# Patient Record
Sex: Female | Born: 1950 | Race: White | Hispanic: No | Marital: Married | Smoking: Never smoker
Health system: Southern US, Community
[De-identification: ages and names within clinical notes are randomized; demographics above are authoritative.]

## PROBLEM LIST (undated history)

## (undated) DIAGNOSIS — M1990 Unspecified osteoarthritis, unspecified site: Secondary | ICD-10-CM

## (undated) DIAGNOSIS — Z808 Family history of malignant neoplasm of other organs or systems: Secondary | ICD-10-CM

## (undated) DIAGNOSIS — Z800 Family history of malignant neoplasm of digestive organs: Secondary | ICD-10-CM

## (undated) DIAGNOSIS — G4730 Sleep apnea, unspecified: Secondary | ICD-10-CM

## (undated) DIAGNOSIS — Z6839 Body mass index (BMI) 39.0-39.9, adult: Secondary | ICD-10-CM

## (undated) DIAGNOSIS — Z923 Personal history of irradiation: Secondary | ICD-10-CM

## (undated) DIAGNOSIS — E119 Type 2 diabetes mellitus without complications: Secondary | ICD-10-CM

## (undated) DIAGNOSIS — Z9889 Other specified postprocedural states: Secondary | ICD-10-CM

## (undated) DIAGNOSIS — I10 Essential (primary) hypertension: Secondary | ICD-10-CM

## (undated) HISTORY — DX: Sleep apnea, unspecified: G47.30

## (undated) HISTORY — DX: Essential (primary) hypertension: I10

## (undated) HISTORY — DX: Body mass index (BMI) 39.0-39.9, adult: Z68.39

## (undated) HISTORY — DX: Family history of malignant neoplasm of other organs or systems: Z80.8

## (undated) HISTORY — DX: Family history of malignant neoplasm of digestive organs: Z80.0

## (undated) HISTORY — DX: Type 2 diabetes mellitus without complications: E11.9

---

## 1997-12-19 ENCOUNTER — Encounter: Admission: RE | Admit: 1997-12-19 | Discharge: 1998-03-19 | Payer: Self-pay | Admitting: Gynecology

## 1999-02-05 ENCOUNTER — Other Ambulatory Visit: Admission: RE | Admit: 1999-02-05 | Discharge: 1999-02-05 | Payer: Self-pay | Admitting: Obstetrics and Gynecology

## 2000-02-05 ENCOUNTER — Other Ambulatory Visit: Admission: RE | Admit: 2000-02-05 | Discharge: 2000-02-05 | Payer: Self-pay | Admitting: Obstetrics and Gynecology

## 2002-05-18 HISTORY — PX: CHOLECYSTECTOMY: SHX55

## 2002-09-14 ENCOUNTER — Encounter: Payer: Self-pay | Admitting: Family Medicine

## 2002-09-14 ENCOUNTER — Encounter: Admission: RE | Admit: 2002-09-14 | Discharge: 2002-09-14 | Payer: Self-pay | Admitting: Family Medicine

## 2002-10-02 ENCOUNTER — Encounter: Payer: Self-pay | Admitting: General Surgery

## 2002-10-05 ENCOUNTER — Encounter: Payer: Self-pay | Admitting: General Surgery

## 2002-10-05 ENCOUNTER — Encounter (INDEPENDENT_AMBULATORY_CARE_PROVIDER_SITE_OTHER): Payer: Self-pay | Admitting: Specialist

## 2002-10-05 ENCOUNTER — Ambulatory Visit (HOSPITAL_COMMUNITY): Admission: RE | Admit: 2002-10-05 | Discharge: 2002-10-06 | Payer: Self-pay | Admitting: General Surgery

## 2003-06-25 ENCOUNTER — Other Ambulatory Visit: Admission: RE | Admit: 2003-06-25 | Discharge: 2003-06-25 | Payer: Self-pay | Admitting: Family Medicine

## 2004-06-19 ENCOUNTER — Ambulatory Visit: Payer: Self-pay | Admitting: Family Medicine

## 2004-06-26 ENCOUNTER — Ambulatory Visit: Payer: Self-pay | Admitting: Family Medicine

## 2004-07-08 ENCOUNTER — Ambulatory Visit: Payer: Self-pay | Admitting: Family Medicine

## 2004-07-24 ENCOUNTER — Other Ambulatory Visit: Admission: RE | Admit: 2004-07-24 | Discharge: 2004-07-24 | Payer: Self-pay | Admitting: Addiction Medicine

## 2005-09-01 ENCOUNTER — Other Ambulatory Visit: Admission: RE | Admit: 2005-09-01 | Discharge: 2005-09-01 | Payer: Self-pay | Admitting: Obstetrics and Gynecology

## 2005-10-05 ENCOUNTER — Ambulatory Visit: Payer: Self-pay | Admitting: Family Medicine

## 2005-10-13 ENCOUNTER — Ambulatory Visit: Payer: Self-pay | Admitting: Family Medicine

## 2005-10-14 ENCOUNTER — Ambulatory Visit: Payer: Self-pay | Admitting: Internal Medicine

## 2006-10-19 ENCOUNTER — Other Ambulatory Visit: Admission: RE | Admit: 2006-10-19 | Discharge: 2006-10-19 | Payer: Self-pay | Admitting: Obstetrics and Gynecology

## 2007-01-31 ENCOUNTER — Ambulatory Visit: Payer: Self-pay | Admitting: Family Medicine

## 2007-01-31 LAB — CONVERTED CEMR LAB
ALT: 24 units/L (ref 0–35)
AST: 19 units/L (ref 0–37)
Alkaline Phosphatase: 67 units/L (ref 39–117)
BUN: 10 mg/dL (ref 6–23)
Basophils Relative: 0.5 % (ref 0.0–1.0)
Bilirubin Urine: NEGATIVE
Bilirubin, Direct: 0.1 mg/dL (ref 0.0–0.3)
CO2: 27 meq/L (ref 19–32)
Calcium: 9 mg/dL (ref 8.4–10.5)
Chloride: 109 meq/L (ref 96–112)
Cholesterol: 203 mg/dL (ref 0–200)
Creatinine, Ser: 0.8 mg/dL (ref 0.4–1.2)
Eosinophils Relative: 6 % — ABNORMAL HIGH (ref 0.0–5.0)
GFR calc Af Amer: 95 mL/min
Glucose, Bld: 104 mg/dL — ABNORMAL HIGH (ref 70–99)
Glucose, Urine, Semiquant: NEGATIVE
HDL: 34.7 mg/dL — ABNORMAL LOW (ref >39.0)
Ketones, urine, test strip: NEGATIVE
Lymphocytes Relative: 43.3 % (ref 12.0–46.0)
Monocytes Relative: 7.7 % (ref 3.0–11.0)
Neutro Abs: 2.1 10*3/uL (ref 1.4–7.7)
Nitrite: NEGATIVE
Platelets: 284 10*3/uL (ref 150–400)
Protein, U semiquant: NEGATIVE
RBC: 4.18 M/uL (ref 3.87–5.11)
RDW: 13.2 % (ref 11.5–14.6)
Total CHOL/HDL Ratio: 5.9
Total Protein: 6.6 g/dL (ref 6.0–8.3)
Triglycerides: 158 mg/dL — ABNORMAL HIGH (ref 0–149)
Urobilinogen, UA: 0.2
VLDL: 32 mg/dL (ref 0–40)
WBC: 5 10*3/uL (ref 4.5–10.5)

## 2007-02-07 ENCOUNTER — Ambulatory Visit: Payer: Self-pay | Admitting: Family Medicine

## 2007-04-18 DIAGNOSIS — J069 Acute upper respiratory infection, unspecified: Secondary | ICD-10-CM | POA: Insufficient documentation

## 2007-04-21 ENCOUNTER — Ambulatory Visit: Payer: Self-pay | Admitting: Family Medicine

## 2007-04-21 ENCOUNTER — Telehealth: Payer: Self-pay | Admitting: Family Medicine

## 2007-10-26 ENCOUNTER — Other Ambulatory Visit: Admission: RE | Admit: 2007-10-26 | Discharge: 2007-10-26 | Payer: Self-pay | Admitting: Obstetrics and Gynecology

## 2007-11-02 ENCOUNTER — Encounter: Payer: Self-pay | Admitting: Family Medicine

## 2007-11-02 ENCOUNTER — Ambulatory Visit: Payer: Self-pay | Admitting: Internal Medicine

## 2007-11-03 ENCOUNTER — Encounter: Admission: RE | Admit: 2007-11-03 | Discharge: 2007-11-03 | Payer: Self-pay | Admitting: Obstetrics and Gynecology

## 2007-11-09 ENCOUNTER — Encounter: Admission: RE | Admit: 2007-11-09 | Discharge: 2007-11-09 | Payer: Self-pay | Admitting: Obstetrics and Gynecology

## 2008-01-16 ENCOUNTER — Ambulatory Visit: Payer: Self-pay | Admitting: Family Medicine

## 2008-01-16 LAB — CONVERTED CEMR LAB
AST: 21 units/L (ref 0–37)
Alkaline Phosphatase: 64 units/L (ref 39–117)
Basophils Absolute: 0.1 10*3/uL (ref 0.0–0.1)
Bilirubin Urine: NEGATIVE
Bilirubin, Direct: 0.1 mg/dL (ref 0.0–0.3)
Blood in Urine, dipstick: NEGATIVE
Chloride: 110 meq/L (ref 96–112)
Cholesterol: 163 mg/dL (ref 0–200)
Eosinophils Absolute: 0.3 10*3/uL (ref 0.0–0.7)
GFR calc non Af Amer: 78 mL/min
Glucose, Urine, Semiquant: NEGATIVE
HDL: 33.4 mg/dL — ABNORMAL LOW (ref >39.0)
LDL Cholesterol: 105 mg/dL — ABNORMAL HIGH (ref 0–99)
Lymphocytes Relative: 39 % (ref 12.0–46.0)
MCHC: 33.9 g/dL (ref 30.0–36.0)
MCV: 90.5 fL (ref 78.0–100.0)
Neutrophils Relative %: 45.1 % (ref 43.0–77.0)
Platelets: 279 10*3/uL (ref 150–400)
Potassium: 4.3 meq/L (ref 3.5–5.1)
Protein, U semiquant: NEGATIVE
Sodium: 144 meq/L (ref 135–145)
TSH: 3.44 microintl units/mL (ref 0.35–5.50)
Total Bilirubin: 0.6 mg/dL (ref 0.3–1.2)
Urobilinogen, UA: 0.2
VLDL: 24 mg/dL (ref 0–40)
WBC Urine, dipstick: NEGATIVE
pH: 5.5

## 2008-01-24 ENCOUNTER — Ambulatory Visit: Payer: Self-pay | Admitting: Family Medicine

## 2008-04-20 ENCOUNTER — Telehealth: Payer: Self-pay | Admitting: *Deleted

## 2008-04-28 ENCOUNTER — Telehealth: Payer: Self-pay | Admitting: Family Medicine

## 2008-07-07 ENCOUNTER — Telehealth: Payer: Self-pay | Admitting: Internal Medicine

## 2008-07-09 ENCOUNTER — Ambulatory Visit: Payer: Self-pay | Admitting: Family Medicine

## 2008-07-30 ENCOUNTER — Ambulatory Visit: Payer: Self-pay | Admitting: Obstetrics and Gynecology

## 2008-11-26 ENCOUNTER — Ambulatory Visit: Payer: Self-pay | Admitting: Family Medicine

## 2008-11-26 DIAGNOSIS — M722 Plantar fascial fibromatosis: Secondary | ICD-10-CM | POA: Insufficient documentation

## 2008-11-26 DIAGNOSIS — M79609 Pain in unspecified limb: Secondary | ICD-10-CM | POA: Insufficient documentation

## 2008-11-26 LAB — CONVERTED CEMR LAB
BUN: 12 mg/dL (ref 6–23)
Basophils Relative: 0.6 % (ref 0.0–3.0)
CO2: 30 meq/L (ref 19–32)
Chloride: 105 meq/L (ref 96–112)
Creatinine, Ser: 0.7 mg/dL (ref 0.4–1.2)
Eosinophils Absolute: 0.2 10*3/uL (ref 0.0–0.7)
Glucose, Bld: 93 mg/dL (ref 70–99)
MCHC: 34.2 g/dL (ref 30.0–36.0)
MCV: 89.5 fL (ref 78.0–100.0)
Monocytes Absolute: 0.5 10*3/uL (ref 0.1–1.0)
Neutro Abs: 2.6 10*3/uL (ref 1.4–7.7)
Neutrophils Relative %: 45.5 % (ref 43.0–77.0)
RBC: 4.34 M/uL (ref 3.87–5.11)

## 2008-12-06 ENCOUNTER — Telehealth: Payer: Self-pay | Admitting: Family Medicine

## 2009-01-18 ENCOUNTER — Ambulatory Visit: Payer: Self-pay | Admitting: Family Medicine

## 2009-01-18 LAB — CONVERTED CEMR LAB
ALT: 31 U/L
AST: 24 U/L
Albumin: 4 g/dL
Alkaline Phosphatase: 66 U/L
BUN: 17 mg/dL
Basophils Absolute: 0 K/uL
Basophils Relative: 0.3 %
Bilirubin Urine: NEGATIVE
Bilirubin, Direct: 0 mg/dL
Blood in Urine, dipstick: NEGATIVE
CO2: 30 meq/L
Calcium: 9 mg/dL
Chloride: 106 meq/L
Cholesterol: 185 mg/dL
Creatinine, Ser: 0.8 mg/dL
Eosinophils Absolute: 0.3 K/uL
Eosinophils Relative: 4.1 %
GFR calc non Af Amer: 77.94 mL/min
Glucose, Bld: 95 mg/dL
Glucose, Urine, Semiquant: NEGATIVE
HCT: 40.7 %
HDL: 38.4 mg/dL — ABNORMAL LOW
Hemoglobin: 14 g/dL
Ketones, urine, test strip: NEGATIVE
LDL Cholesterol: 122 mg/dL — ABNORMAL HIGH
Lymphocytes Relative: 31.5 %
Lymphs Abs: 2.4 K/uL
MCHC: 34.4 g/dL
MCV: 89.1 fL
Monocytes Absolute: 0.6 K/uL
Monocytes Relative: 7.6 %
Neutro Abs: 4.3 K/uL
Neutrophils Relative %: 56.5 %
Nitrite: NEGATIVE
Platelets: 257 K/uL
Potassium: 4 meq/L
RBC: 4.56 M/uL
RDW: 13.6 %
Sodium: 142 meq/L
Specific Gravity, Urine: 1.015
TSH: 2.54 u[IU]/mL
Total Bilirubin: 0.7 mg/dL
Total CHOL/HDL Ratio: 5
Total Protein: 7.4 g/dL
Triglycerides: 122 mg/dL
Urobilinogen, UA: 0.2
VLDL: 24.4 mg/dL
WBC Urine, dipstick: NEGATIVE
WBC: 7.6 10*3/microliter
pH: 7.5

## 2009-01-22 ENCOUNTER — Encounter: Admission: RE | Admit: 2009-01-22 | Discharge: 2009-01-22 | Payer: Self-pay | Admitting: Family Medicine

## 2009-01-25 ENCOUNTER — Ambulatory Visit: Payer: Self-pay | Admitting: Family Medicine

## 2009-10-30 ENCOUNTER — Telehealth: Payer: Self-pay | Admitting: Speech Pathology

## 2010-02-27 ENCOUNTER — Encounter: Admission: RE | Admit: 2010-02-27 | Discharge: 2010-02-27 | Payer: Self-pay | Admitting: Family Medicine

## 2010-06-09 ENCOUNTER — Other Ambulatory Visit: Payer: Self-pay | Admitting: Family Medicine

## 2010-06-09 ENCOUNTER — Ambulatory Visit
Admission: RE | Admit: 2010-06-09 | Discharge: 2010-06-09 | Payer: Self-pay | Source: Home / Self Care | Attending: Family Medicine | Admitting: Family Medicine

## 2010-06-09 LAB — CBC WITH DIFFERENTIAL/PLATELET
Eosinophils Absolute: 0.2 10*3/uL (ref 0.0–0.7)
Eosinophils Relative: 4.3 % (ref 0.0–5.0)
HCT: 39.3 % (ref 36.0–46.0)
Lymphs Abs: 2.5 10*3/uL (ref 0.7–4.0)
MCHC: 33.8 g/dL (ref 30.0–36.0)
MCV: 90.3 fl (ref 78.0–100.0)
Monocytes Absolute: 0.4 10*3/uL (ref 0.1–1.0)
Platelets: 297 10*3/uL (ref 150.0–400.0)
WBC: 5.7 10*3/uL (ref 4.5–10.5)

## 2010-06-09 LAB — CONVERTED CEMR LAB
Blood in Urine, dipstick: NEGATIVE
Nitrite: NEGATIVE
Specific Gravity, Urine: 1.015
Urobilinogen, UA: 0.2
WBC Urine, dipstick: NEGATIVE

## 2010-06-09 LAB — HEPATIC FUNCTION PANEL
ALT: 28 U/L (ref 0–35)
Total Bilirubin: 0.7 mg/dL (ref 0.3–1.2)
Total Protein: 7.1 g/dL (ref 6.0–8.3)

## 2010-06-09 LAB — LIPID PANEL
Cholesterol: 188 mg/dL (ref 0–200)
LDL Cholesterol: 118 mg/dL — ABNORMAL HIGH (ref 0–99)
Triglycerides: 158 mg/dL — ABNORMAL HIGH (ref 0.0–149.0)

## 2010-06-09 LAB — BASIC METABOLIC PANEL
BUN: 13 mg/dL (ref 6–23)
CO2: 30 mEq/L (ref 19–32)
Chloride: 98 mEq/L (ref 96–112)
Glucose, Bld: 86 mg/dL (ref 70–99)
Potassium: 4.5 mEq/L (ref 3.5–5.1)

## 2010-06-09 LAB — TSH: TSH: 3.11 u[IU]/mL (ref 0.35–5.50)

## 2010-06-16 ENCOUNTER — Ambulatory Visit
Admission: RE | Admit: 2010-06-16 | Discharge: 2010-06-16 | Payer: Self-pay | Source: Home / Self Care | Attending: Family Medicine | Admitting: Family Medicine

## 2010-06-16 ENCOUNTER — Encounter: Payer: Self-pay | Admitting: Family Medicine

## 2010-06-17 NOTE — Progress Notes (Signed)
Summary: Pt req referral to Physical Therapist  Phone Note Call from Patient Call back at Home Phone [phone] Call back at Work Phone [phone]   Caller: Patient Summary of Call: Pt called is is req referral for Physical Therapist re: Plantarfascitis. Dr. Tawanna Cooler said that he would refer pt to PT.    Initial call taken by: Lucy Antigua,  October 30, 2009 3:04 PM  Follow-up for Phone Call        please set up patient to see Jeanene Erb, physical therapist for treatment of plantar fasciitis Follow-up by: Roderick Pee MD,  October 31, 2009 8:35 AM

## 2010-06-25 NOTE — Assessment & Plan Note (Signed)
Summary: cpx/mm   Vital Signs:  Patient profile:   60 year old female Menstrual status:  postmenopausal Height:      63.25 inches Weight:      205 pounds BMI:     36.16 Temp:     98.5 degrees F oral BP sitting:   130 / 84  (left arm) Cuff size:   regular  Vitals Entered By: Kern Reap CMA Duncan Dull) (June 16, 2010 2:05 PM) CC: cpx Is Patient Diabetic? No Pain Assessment Patient in pain? no        CC:  cpx.  History of Present Illness: Kylie Davis is a 60 year old, married female, nonsmoker, who comes in today for general physical examination  She's always been in excellent health except for some venous insufficiency for which he takes Mevacor, thiazide 25 mg daily, and her weight is 63 inches.  Weight 205 pounds.  Review of systems otherwise negative.  She gets routine eye care, dental care, BSE monthly, and a mammography, colonoscopy, normal.  Tetanus 2005.  Encouraged annual flu shots in the future  Allergies: No Known Drug Allergies  Past History:  Past medical, surgical, family and social histories (including risk factors) reviewed, and no changes noted (except as noted below). Past medical, surgical, family and social histories (including risk factors) reviewed for relevance to current acute and chronic problems.  Past Medical History: Reviewed history from 04/21/2007 and no changes required. gallbladder removal  Past Surgical History: Reviewed history from 01/18/2007 and no changes required. Cholecystectomy  2004  Family History: Reviewed history from 04/21/2007 and no changes required. Family History Ovarian cancerMom 2008 Family History of CAD Female 1st degree relative <60 Family History High cholesterol Family History Hypertension  Social History: Reviewed history from 04/21/2007 and no changes required. Occupation:schoolteacher Married Never Smoked Alcohol use-no Drug use-no Regular exercise-no  Review of Systems      See HPI  Physical  Exam  General:  Well-developed,well-nourished,in no acute distress; alert,appropriate and cooperative throughout examination Head:  Normocephalic and atraumatic without obvious abnormalities. No apparent alopecia or balding. Eyes:  No corneal or conjunctival inflammation noted. EOMI. Perrla. Funduscopic exam benign, without hemorrhages, exudates or papilledema. Vision grossly normal. Ears:  External ear exam shows no significant lesions or deformities.  Otoscopic examination reveals clear canals, tympanic membranes are intact bilaterally without bulging, retraction, inflammation or discharge. Hearing is grossly normal bilaterally. Nose:  External nasal examination shows no deformity or inflammation. Nasal mucosa are pink and moist without lesions or exudates. Mouth:  Oral mucosa and oropharynx without lesions or exudates.  Teeth in good repair. Neck:  No deformities, masses, or tenderness noted. Chest Wall:  No deformities, masses, or tenderness noted. Breasts:  No mass, nodules, thickening, tenderness, bulging, retraction, inflamation, nipple discharge or skin changes noted.   Lungs:  Normal respiratory effort, chest expands symmetrically. Lungs are clear to auscultation, no crackles or wheezes. Heart:  Normal rate and regular rhythm. S1 and S2 normal without gallop, murmur, click, rub or other extra sounds. Abdomen:  Bowel sounds positive,abdomen soft and non-tender without masses, organomegaly or hernias noted. Rectal:  gun Genitalia:  gyn Msk:  No deformity or scoliosis noted of thoracic or lumbar spine.   Pulses:  R and L carotid,radial,femoral,dorsalis pedis and posterior tibial pulses are full and equal bilaterally Extremities:  No clubbing, cyanosis, edema, or deformity noted with normal full range of motion of all joints.   Neurologic:  No cranial nerve deficits noted. Station and gait are normal. Plantar reflexes are down-going  bilaterally. DTRs are symmetrical throughout. Sensory, motor  and coordinative functions appear intact. Skin:  Intact without suspicious lesions or rashes Cervical Nodes:  No lymphadenopathy noted Axillary Nodes:  No palpable lymphadenopathy Inguinal Nodes:  No significant adenopathy Psych:  Cognition and judgment appear intact. Alert and cooperative with normal attention span and concentration. No apparent delusions, illusions, hallucinations   Impression & Recommendations:  Problem # 1:  PERIPHERAL EDEMA (ICD-782.3) Assessment Improved  Her updated medication list for this problem includes:    Hydrochlorothiazide 25 Mg Tabs (Hydrochlorothiazide) .Marland Kitchen... Take 1 tablet by mouth every morning  Orders: Prescription Created Electronically [phone])  Problem # 2:  PHYSICAL EXAMINATION (ICD-V70.0) Assessment: Unchanged  Orders: Prescription Created Electronically [phone]) EKG w/ Interpretation (93000)  Problem # 3:  OVERWEIGHT (ICD-278.02) Assessment: Unchanged  Orders: Prescription Created Electronically [phone]) EKG w/ Interpretation (93000)  Complete Medication List: 1)  Aspirin 81 Mg Tbec (Aspirin) .... Once daily 2)  Multivitamins Caps (Multiple vitamin) .... Once daily 3)  Calcium 500/d 500-400 Mg-unit Chew (Calcium-vitamin d) .... Three daily 4)  Hydrochlorothiazide 25 Mg Tabs (Hydrochlorothiazide) .... Take 1 tablet by mouth every morning  Patient Instructions: 1)  It is important that you exercise regularly at least 20 minutes 5 times a week. If you develop chest pain, have severe difficulty breathing, or feel very tired , stop exercising immediately and seek medical attention. 2)  You need to lose weight. Consider a lower calorie diet and regular exercise.  3)  Schedule your mammogram. 4)  Schedule a colonoscopy/sigmoidoscopy to help detect colon cancer. 5)  Take calcium +Vitamin D daily. 6)  Take an Aspirin every day. 7)  Please schedule a follow-up appointment in 1 year. Prescriptions: HYDROCHLOROTHIAZIDE 25 MG TABS  (HYDROCHLOROTHIAZIDE) Take 1 tablet by mouth every morning  #100 x 3   Entered and Authorized by:   Roderick Pee MD   Signed by:   Roderick Pee MD on 06/16/2010   Method used:   Electronically to        [facility]       [address]       [place], [place]  [address]       Ph: [phone]       Fax: [phone]   RxID:   [id number]    Orders Added: 1)  Prescription Created Electronically [[id number]] 2)  Est. Patient 40-64 years [47425] 3)  EKG w/ Interpretation [93000]

## 2010-10-03 NOTE — Op Note (Signed)
[name], [name]                         ACCOUNT NO.:  [id number]   MEDICAL RECORD NO.:  [id number]                   PATIENT TYPE:  OIB   LOCATION:  5707                                 FACILITY:  [facility]   PHYSICIAN:  Ollen Gross. Vernell Morgans, M.D.              DATE OF BIRTH:  1950/10/04   DATE OF PROCEDURE:  10/05/2002  DATE OF DISCHARGE:                                 OPERATIVE REPORT   PREOPERATIVE DIAGNOSIS:  Gallstones.   POSTOPERATIVE DIAGNOSIS:  Gallstones.   PROCEDURE:  Laparoscopic cholecystectomy with intraoperative cholangiogram.   SURGEON:  Ollen Gross. Carolynne Edouard, M.D.   ASSISTANT:  Anselm Pancoast. Zachery Dakins, M.D.   ANESTHESIA:  General endotracheal anesthesia.   DESCRIPTION OF PROCEDURE:  After informed consent was obtained, the patient  was brought to the operating room and placed in the supine position on the  operating table.  After adequate induction of general anesthesia the  patient's abdomen was prepped with Betadine and draped in the usual sterile  manner.  The area below the umbilicus was infiltrated with 0.25% Marcaine.   A small incision was made with the 15-blade knife.  This incision was  carried down through the subcutaneous tissue bluntly with the Kelly clamp  and __________ retractors.  The linea alba was identified.  The linea alba  was incised with the 15-blade knife and each side was grasped with Kocher  clamps and elevated anteriorly.  The preperitoneal space was prepared  bluntly with the hemostat until the peritoneum was opened and access was  obtained into the abdominal cavity.  A #0 Vicryl pursestring stitch was  placed in the fascia surrounding the opening.  A Hasson cannula was placed  through the opening and anchored in place with previously placed Vicryl  pursestring stitch.  The abdomen was then insufflated with carbon dioxide  without difficulty.   The patient was placed in the head up position and the laparoscope was  placed through the Hasson  cannula and the right upper quadrant was  inspected. The dome of the gallbladder and liver were readily identified.  Next, the epigastric area was infiltrated with 0.25% Marcaine and a small  incision was made with the 15-blade knife.  A 10-mm port was then placed  bluntly through this incision into the abdominal cavity under direct vision.  Sites were then chosen laterally on the right side of the abdomen for  placement of 5-mm ports.  Each of these areas was infiltrated with 0.25%  Marcaine.   Small stab incisions were made with a 15-blade knife and 5-mm ports were  placed bluntly through these incisions into the abdominal cavity under  direct vision.  A blunt grasp was placed to the lattermost 5-mm port and  used to grasp the bed of the gallbladder and elevate it anteriorly and  superiorly.  Another blunt grasp was placed at the other 5-mm port and used  to retract  on the body and neck of the gallbladder.  A dissector was placed  through the epigastric port, and using electrocautery, the peritoneal  reflection of the gallbladder neck area was opened.   Blunt dissection was then carried out in this area until the gallbladder and  its cystic duct junction was readily identified and a good window was  created.  A single clip was placed on the gallbladder neck.  A small  ductotomy was made with the laparoscopic scissors.  A 14-gauge Angiocath was  placed percutaneously through the anterior abdominal wall.  A Reddick  cholangiogram catheter was placed through the Angiocath and flushed.  The  Reddick catheter was then placed within the cystic duct and anchored in  place with a clip.   A cholangiogram was obtained that showed a good length on the cystic duct,  rapid emptying into the duodenum and no filling defects.  The anchoring  clipping catheters were then removed from the patient. Three clips were  placed proximally on the cystic duct and the duct was divided between the  two sets of  clips.  Posterior to this the cystic artery was identified, and  again dissected circumferentially until a good window was created.  Two  clips were placed proximally and 1 distally on the artery, and the artery  was divided between the two.   Next, a laparoscopic hook cautery device was used to separate the  gallbladder from the liver bed prior to completely detaching the gallbladder  from the liver bed and the liver bed was inspected and several small  bleeding points were coagulated with the electrocautery.  The gallbladder  was then detached the rest of the way from the liver bed without difficulty.  The laparoscope was then moved to the upper midline port and a gallbladder  grasper was placed through the Hasson cannula and the gallbladder neck was  grasped with the grasper and the gallbladder was removed with the Hasson  cannula through the infraumbilical port without difficulty.   The fascial defect was then closed with the previously placed Vicryl  pursestring stitch.  The rest of the reports were then removed under direct  vision and all were found to be hemostatic.  The gas was allowed to escape.  The skin incisions were all closed with interrupted 4-0 Monocryl  subcuticular stitches.  Benzoin and Steri-Strips were applied.  The patient  tolerated the procedure well.  At the end of the case all needle, sponge,  and instrument counts were correct.  The patient was awakened and taken to  the recovery room in stable condition.                                               Ollen Gross. Vernell Morgans, M.D.    PST/MEDQ  D:  10/05/2002  T:  10/06/2002  Job:  [id number]

## 2010-12-03 ENCOUNTER — Other Ambulatory Visit (HOSPITAL_COMMUNITY)
Admission: RE | Admit: 2010-12-03 | Discharge: 2010-12-03 | Disposition: A | Discharge status: Home / Self Care | Payer: BC Managed Care – PPO | Source: Ambulatory Visit | Attending: Obstetrics and Gynecology | Admitting: Obstetrics and Gynecology

## 2010-12-03 ENCOUNTER — Other Ambulatory Visit: Payer: Self-pay | Admitting: Obstetrics and Gynecology

## 2010-12-03 ENCOUNTER — Encounter (INDEPENDENT_AMBULATORY_CARE_PROVIDER_SITE_OTHER): Payer: BC Managed Care – PPO | Admitting: Obstetrics and Gynecology

## 2010-12-03 DIAGNOSIS — Z01419 Encounter for gynecological examination (general) (routine) without abnormal findings: Secondary | ICD-10-CM

## 2010-12-03 DIAGNOSIS — R823 Hemoglobinuria: Secondary | ICD-10-CM

## 2010-12-03 DIAGNOSIS — Z124 Encounter for screening for malignant neoplasm of cervix: Secondary | ICD-10-CM | POA: Insufficient documentation

## 2010-12-15 ENCOUNTER — Encounter: Payer: Self-pay | Admitting: Family Medicine

## 2010-12-15 ENCOUNTER — Ambulatory Visit (INDEPENDENT_AMBULATORY_CARE_PROVIDER_SITE_OTHER): Payer: BC Managed Care – PPO | Admitting: Family Medicine

## 2010-12-15 VITALS — BP 120/82 | Temp 98.3°F | Wt 208.0 lb

## 2010-12-15 DIAGNOSIS — L738 Other specified follicular disorders: Secondary | ICD-10-CM

## 2010-12-15 DIAGNOSIS — Z23 Encounter for immunization: Secondary | ICD-10-CM

## 2010-12-15 DIAGNOSIS — L739 Follicular disorder, unspecified: Secondary | ICD-10-CM

## 2010-12-15 NOTE — Progress Notes (Signed)
  Subjective:    Patient ID: Kylie Davis, female    DOB: 1950-07-29, 60 y.o.   MRN: [id number]  HPI Patient seen with basically asymptomatic rash anterior chest. Noted just recently after her trip back from the beach. Rash is nonpainful and nonpruritic. No pustular center. No other generalized rash. She is requesting shingles vaccine. She has not taken any prednisone or immunosuppressants. No history of chemotherapy. No allergic contraindications. Denies any recent fever or chills   Review of Systems  Constitutional: Negative for fever and chills.  Respiratory: Negative for shortness of breath.   Skin: Positive for rash.  Hematological: Negative for adenopathy.       Objective:   Physical Exam  Constitutional: She appears well-developed and well-nourished.  Cardiovascular: Normal rate and regular rhythm.   Pulmonary/Chest: Effort normal and breath sounds normal. No respiratory distress. She has no wheezes. She has no rales.  Skin:       Patient has a few nonspecific erythematous papules anterior chest. No pustular center. No vesicles. These are nonscaly          Assessment & Plan:  #1 nonspecific follicular rash anterior chest. Antibacterial soap and consider antibiotic if not improving #2 shingles vaccine. No contraindications. Distally given today No problem-specific assessment & plan notes found for this encounter.

## 2010-12-15 NOTE — Patient Instructions (Signed)
Keep rash clean with good antibacterial soap such as Lever 2000 or Dial. Touch base in 2-3 weeks if this is not clearing and sooner if rash worsens

## 2010-12-25 ENCOUNTER — Other Ambulatory Visit: Payer: Self-pay

## 2010-12-25 ENCOUNTER — Ambulatory Visit (INDEPENDENT_AMBULATORY_CARE_PROVIDER_SITE_OTHER): Payer: BC Managed Care – PPO | Admitting: Gynecology

## 2010-12-25 DIAGNOSIS — Z13820 Encounter for screening for osteoporosis: Secondary | ICD-10-CM

## 2011-01-20 ENCOUNTER — Other Ambulatory Visit: Payer: Self-pay | Admitting: Obstetrics and Gynecology

## 2011-01-20 DIAGNOSIS — Z1231 Encounter for screening mammogram for malignant neoplasm of breast: Secondary | ICD-10-CM

## 2011-02-03 ENCOUNTER — Encounter: Payer: Self-pay | Admitting: Obstetrics and Gynecology

## 2011-03-02 ENCOUNTER — Ambulatory Visit
Admission: RE | Admit: 2011-03-02 | Discharge: 2011-03-02 | Disposition: A | Discharge status: Home / Self Care | Payer: BC Managed Care – PPO | Source: Ambulatory Visit | Attending: Obstetrics and Gynecology | Admitting: Obstetrics and Gynecology

## 2011-03-02 DIAGNOSIS — Z1231 Encounter for screening mammogram for malignant neoplasm of breast: Secondary | ICD-10-CM

## 2011-06-15 ENCOUNTER — Other Ambulatory Visit (INDEPENDENT_AMBULATORY_CARE_PROVIDER_SITE_OTHER): Payer: BC Managed Care – PPO

## 2011-06-15 DIAGNOSIS — Z0000 Encounter for general adult medical examination without abnormal findings: Secondary | ICD-10-CM

## 2011-06-15 LAB — CBC WITH DIFFERENTIAL/PLATELET
Basophils Absolute: 0 10*3/uL (ref 0.0–0.1)
Basophils Relative: 0.6 % (ref 0.0–3.0)
Eosinophils Absolute: 0.2 10*3/uL (ref 0.0–0.7)
HCT: 38.1 % (ref 36.0–46.0)
Hemoglobin: 12.7 g/dL (ref 12.0–15.0)
Lymphs Abs: 2.4 10*3/uL (ref 0.7–4.0)
MCHC: 33.3 g/dL (ref 30.0–36.0)
MCV: 90.5 fl (ref 78.0–100.0)
Monocytes Absolute: 0.6 10*3/uL (ref 0.1–1.0)
Neutro Abs: 3 10*3/uL (ref 1.4–7.7)
RDW: 14.5 % (ref 11.5–14.6)

## 2011-06-15 LAB — POCT URINALYSIS DIPSTICK
Bilirubin, UA: NEGATIVE
Glucose, UA: NEGATIVE
Ketones, UA: NEGATIVE
Leukocytes, UA: NEGATIVE
Nitrite, UA: NEGATIVE
pH, UA: 6

## 2011-06-15 LAB — BASIC METABOLIC PANEL
CO2: 28 mEq/L (ref 19–32)
Calcium: 8.6 mg/dL (ref 8.4–10.5)
Chloride: 105 mEq/L (ref 96–112)
Glucose, Bld: 98 mg/dL (ref 70–99)
Sodium: 139 mEq/L (ref 135–145)

## 2011-06-15 LAB — HEPATIC FUNCTION PANEL
ALT: 26 U/L (ref 0–35)
Albumin: 3.7 g/dL (ref 3.5–5.2)
Bilirubin, Direct: 0 mg/dL (ref 0.0–0.3)
Total Protein: 7.2 g/dL (ref 6.0–8.3)

## 2011-06-15 LAB — LIPID PANEL
HDL: 43.2 mg/dL (ref >39.00)
Total CHOL/HDL Ratio: 4
Triglycerides: 146 mg/dL (ref 0.0–149.0)

## 2011-06-22 ENCOUNTER — Ambulatory Visit (INDEPENDENT_AMBULATORY_CARE_PROVIDER_SITE_OTHER): Payer: BC Managed Care – PPO | Admitting: Family Medicine

## 2011-06-22 ENCOUNTER — Encounter: Payer: Self-pay | Admitting: Family Medicine

## 2011-06-22 DIAGNOSIS — E663 Overweight: Secondary | ICD-10-CM

## 2011-06-22 DIAGNOSIS — R609 Edema, unspecified: Secondary | ICD-10-CM

## 2011-06-22 MED ORDER — HYDROCHLOROTHIAZIDE 25 MG PO TABS: 25.0000 mg | ORAL_TABLET | ORAL | Status: DC

## 2011-06-22 NOTE — Patient Instructions (Signed)
Continue your current medications  This-year-old work on the diet exercise and weight loss  Followup in 1 year sooner if any problems

## 2011-06-22 NOTE — Progress Notes (Signed)
  Subjective:    Patient ID: Kylie Davis, female    DOB: [birth year], 61 y.o.   MRN: [id number]  HPI,, Kylie Davis is a 61 year old married female nonsmoker who comes in today for general physical examination because of a history of peripheral edema treated with hydrochlorothiazide 25 mg daily and overweight  She also takes an aspirin tablet calcium vitamin D.  Her weight is unchanged at 216 pounds  She gets routine eye care, dental care, BSE monthly, and you mammography, initial colonoscopy normal, vaccinations up to date.    Review of Systems  Constitutional: Negative.   HENT: Negative.   Eyes: Negative.   Respiratory: Negative.   Cardiovascular: Negative.   Gastrointestinal: Negative.   Genitourinary: Negative.   Musculoskeletal: Negative.   Neurological: Negative.   Hematological: Negative.   Psychiatric/Behavioral: Negative.        Objective:   Physical Exam  Constitutional: She appears well-developed and well-nourished.  HENT:  Head: Normocephalic and atraumatic.  Right Ear: External ear normal.  Left Ear: External ear normal.  Nose: Nose normal.  Mouth/Throat: Oropharynx is clear and moist.  Eyes: EOM are normal. Pupils are equal, round, and reactive to light.  Neck: Normal range of motion. Neck supple. No thyromegaly present.  Cardiovascular: Normal rate, regular rhythm, normal heart sounds and intact distal pulses.  Exam reveals no gallop and no friction rub.   No murmur heard. Pulmonary/Chest: Effort normal and breath sounds normal.  Abdominal: Soft. Bowel sounds are normal. She exhibits no distension and no mass. There is no tenderness. There is no rebound.  Genitourinary:       Bilateral breast exam normal  Musculoskeletal: Normal range of motion.  Lymphadenopathy:    She has no cervical adenopathy.  Neurological: She is alert. She has normal reflexes. No cranial nerve deficit. She exhibits normal muscle tone. Coordination normal.  Skin: Skin is warm and dry.    Psychiatric: She has a normal mood and affect. Her behavior is normal. Judgment and thought content normal.          Assessment & Plan:  Healthy female  Overweight again recommended diet exercise and weight loss  Peripheral edema continue hydrochlorothiazide followup in 1 year

## 2011-08-05 ENCOUNTER — Telehealth: Payer: Self-pay | Admitting: *Deleted

## 2011-08-05 ENCOUNTER — Encounter: Payer: Self-pay | Admitting: Family

## 2011-08-05 ENCOUNTER — Ambulatory Visit (INDEPENDENT_AMBULATORY_CARE_PROVIDER_SITE_OTHER): Payer: BC Managed Care – PPO | Admitting: Family

## 2011-08-05 VITALS — BP 120/80 | HR 83 | Temp 97.7°F

## 2011-08-05 DIAGNOSIS — H918X9 Other specified hearing loss, unspecified ear: Secondary | ICD-10-CM

## 2011-08-05 DIAGNOSIS — H6120 Impacted cerumen, unspecified ear: Secondary | ICD-10-CM

## 2011-08-05 DIAGNOSIS — H6122 Impacted cerumen, left ear: Secondary | ICD-10-CM

## 2011-08-05 NOTE — Patient Instructions (Signed)
Cerumen Impaction  A cerumen impaction is when the wax in your ear forms a plug. This plug usually causes reduced hearing. Sometimes it also causes an earache or dizziness. Removing a cerumen impaction can be difficult and painful. The wax sticks to the ear canal. The canal is sensitive and bleeds easily. If you try to remove a heavy wax buildup with a cotton tipped swab, you may push it in further.  Irrigation with water, suction, and small ear curettes may be used to clear out the wax. If the impaction is fixed to the skin in the ear canal, ear drops may be needed for a few days to loosen the wax. People who build up a lot of wax frequently can use ear wax removal products available in your local drugstore.  SEEK MEDICAL CARE IF:    You develop an earache, increased hearing loss, or marked dizziness.  Document Released: 06/11/2004 Document Revised: 04/23/2011 Document Reviewed: 08/01/2009  [employer] Patient Information 2012 [employer].

## 2011-08-05 NOTE — Telephone Encounter (Signed)
Pt  Cannot hear from one ear this am, and knew that Dr Tawanna Cooler meant to do an irrigation her last visit but forgot.  Will see Padonda today.

## 2011-08-05 NOTE — Progress Notes (Signed)
  Subjective:    Patient ID: Kylie Davis, female    DOB: 10/29/1950, 61 y.o.   MRN: [id number]  HPI 61 year old white female who is in today with complaints of a cerumen impaction left ear. She's had decreased hearing in her left ear. Denies any pain, no drainage or discharge.   Review of Systems  Constitutional: Negative.   HENT: Positive for hearing loss. Negative for congestion, rhinorrhea, sneezing, neck pain and postnasal drip.   Respiratory: Negative.   Cardiovascular: Negative.    No past medical history on file.  History   Social History  . Marital Status: Married    Spouse Name: N/A    Number of Children: N/A  . Years of Education: N/A   Occupational History  . Not on file.   Social History Main Topics  . Smoking status: Never Smoker   . Smokeless tobacco: Not on file  . Alcohol Use: Not on file  . Drug Use: Not on file  . Sexually Active: Not on file   Other Topics Concern  . Not on file   Social History Narrative  . No narrative on file    Past Surgical History  Procedure Date  . Cholecystectomy 2004    Family History  Problem Relation Age of Onset  . Cancer Mother     ovarian  . Coronary artery disease Other   . Hyperlipidemia Other   . Hypertension Other   . Cancer Father     stomach  . Heart disease Father   . Hypertension Brother   . Diabetes Paternal Grandmother     No Known Allergies  Current Outpatient Prescriptions on File Prior to Visit  Medication Sig Dispense Refill  . aspirin 81 MG tablet Take 81 mg by mouth daily.        . calcium-vitamin D (SM CALCIUM 500/VITAMIN D3) 500-400 MG-UNIT per tablet Take 1 tablet by mouth 3 (three) times daily.        . hydrochlorothiazide (HYDRODIURIL) 25 MG tablet Take 1 tablet (25 mg total) by mouth every morning.  100 tablet  3  . multivitamin (THERAGRAN) per tablet Take 1 tablet by mouth daily.          BP 120/80  Pulse 83  Temp(Src) 97.7 F (36.5 C) (Oral)  SpO2 97%chart      Objective:   Physical Exam  Constitutional: She is oriented to person, place, and time. She appears well-developed and well-nourished.  HENT:  Right Ear: External ear normal.  Nose: Nose normal.  Mouth/Throat: Oropharynx is clear and moist.       Left ear cerumen impaction  Neck: Normal range of motion. Neck supple.  Cardiovascular: Normal rate, regular rhythm and normal heart sounds.   Pulmonary/Chest: Effort normal and breath sounds normal.  Neurological: She is alert and oriented to person, place, and time.  Skin: Skin is warm and dry.  Psychiatric: She has a normal mood and affect.          Assessment & Plan:  Assessment: Cerumen impaction, hearing loss-temporary  Plan: Recheck when necessary

## 2012-02-16 ENCOUNTER — Telehealth: Payer: Self-pay | Admitting: Family Medicine

## 2012-02-16 NOTE — Telephone Encounter (Signed)
Caller: [name]/Patient; Patient Name: Kylie Davis; PCP: Kelle Darting [facility]); Best Callback Phone Number: [phone]; Reason for call: Slipped getting out of the shower and slid becoming aware of left knee pain on 02/08/12. Patient has used Ibuprofen and ice to knee.  Rate pain in knee as a 3/10 pain scale. Can walk and bend knee but has pain on inner side of  left knee.  Denies swelling , but may be slightly.  Becomes uncomfortable riding in the care and at times when sitting.  Has tingling to the center of the left side of the knee.  Triaged using Knee injury with a disposition of home care due to twisting injury with minimal symptoms now.  Care advice given with caller demonstrating understanding.  No appointment needed at this time.

## 2012-02-18 ENCOUNTER — Encounter: Payer: Self-pay | Admitting: Internal Medicine

## 2012-02-25 ENCOUNTER — Ambulatory Visit (INDEPENDENT_AMBULATORY_CARE_PROVIDER_SITE_OTHER): Payer: BC Managed Care – PPO | Admitting: Family Medicine

## 2012-02-25 ENCOUNTER — Encounter: Payer: Self-pay | Admitting: Family Medicine

## 2012-02-25 VITALS — BP 130/76 | HR 72 | Temp 98.6°F | Resp 16 | Ht 63.5 in | Wt 222.0 lb

## 2012-02-25 DIAGNOSIS — E663 Overweight: Secondary | ICD-10-CM

## 2012-02-25 DIAGNOSIS — IMO0002 Reserved for concepts with insufficient information to code with codable children: Secondary | ICD-10-CM

## 2012-02-25 DIAGNOSIS — Z23 Encounter for immunization: Secondary | ICD-10-CM

## 2012-02-25 DIAGNOSIS — S83207A Unspecified tear of unspecified meniscus, current injury, left knee, initial encounter: Secondary | ICD-10-CM

## 2012-02-25 MED ORDER — TRAMADOL HCL 50 MG PO TABS: ORAL_TABLET | ORAL | Status: DC

## 2012-02-25 NOTE — Progress Notes (Signed)
  Subjective:    Patient ID: Kylie Davis, female    DOB: 1950/10/23, 61 y.o.   MRN: [id number]  HPI Carollee Herter is a 61 year old married female nonsmoker who comes in today with a two-week history of pain in her left knee. She states about 2 weeks ago at home she twisted her left knee and it seemed to be improving with Motrin elevation and ice and so yesterday when she was walking in her knee disc gave way. She's never had a history of trauma to that knee. She is overweight   Review of Systems General and orthopedic review of systems otherwise negative    Objective:   Physical Exam Well-developed well-nourished female in no acute distress examination of the knee shows the ligaments to be intact there is tenderness along the medial joint line full range of motion       Assessment & Plan:  Partial tear of medial cartilage left knee plan elevation ice knee immobilizer crutches

## 2012-02-25 NOTE — Patient Instructions (Addendum)
Motrin 600 mg twice daily with food  Elevation and ice as much is possible  Crutches and a knee immobilizer  Return in 4 weeks for followup  Tramadol one half to one tablet at bedtime when necessary for pain

## 2012-02-29 ENCOUNTER — Ambulatory Visit: Payer: BC Managed Care – PPO | Admitting: Family Medicine

## 2012-02-29 ENCOUNTER — Telehealth: Payer: Self-pay | Admitting: Family Medicine

## 2012-02-29 NOTE — Telephone Encounter (Signed)
Pt was here to see Dr. Tawanna Cooler on 10/10, and he gave her crutches and an immobilizer for her knee. Pt having difficult time walking long distances on those, and is requesting a handicap parking sticker for a couple of weeks. Please advise and call pt. Thank you.

## 2012-02-29 NOTE — Telephone Encounter (Signed)
Ready for pick up and patient is aware 

## 2012-03-24 ENCOUNTER — Encounter: Payer: Self-pay | Admitting: Family Medicine

## 2012-03-24 ENCOUNTER — Ambulatory Visit (INDEPENDENT_AMBULATORY_CARE_PROVIDER_SITE_OTHER): Payer: BC Managed Care – PPO | Admitting: Family Medicine

## 2012-03-24 VITALS — BP 124/80

## 2012-03-24 DIAGNOSIS — IMO0002 Reserved for concepts with insufficient information to code with codable children: Secondary | ICD-10-CM

## 2012-03-24 DIAGNOSIS — S83207A Unspecified tear of unspecified meniscus, current injury, left knee, initial encounter: Secondary | ICD-10-CM

## 2012-03-24 NOTE — Patient Instructions (Signed)
Stop the knee immobilizer  Elevation and ice each bedtime when necessary  Continue the Motrin 200 mg twice a day  Followup when necessary

## 2012-03-24 NOTE — Progress Notes (Signed)
  Subjective:    Patient ID: TYFFANI FOGLESONG, female    DOB: [birth year], 61 y.o.   MRN: [id number]  HPI Ellana is a 61 year old female who comes in today for reevaluation of pain in her left knee  We saw her on October 10. 2 weeks prior that she had twisted her knee and fell pain and was treating herself symptomatically at home until the day prior to that October 10 visit when she twisted and felt a popping sensation at. On exam she appeared to have a small tear in the medial cartilage left knee. She was given an immobilizer elevation and ice and Motrin and she is back today for followup. She states she is taking Motrin 200 mg twice a day wearing the knee immobilizer using ice and a knee feels a whole lot better.  She and her husband Skip are taking a boat to [place] in January and she wants to be sure she is okay to travel   Review of Systems    orthopedic review of systems otherwise negative Objective:   Physical Exam Well-developed and nourished female in acute distress examination of left knee shows no effusion ligaments intact ,,,,,,,,,,,,,no tenderness medial joint line       Assessment & Plan:  Suspected small tear of medial cartilage left knee improve with elevation and immobilization ice and Motrin stop the knee immobilizer continue the Motrin elevation and ice in the evening return when necessary

## 2012-05-04 ENCOUNTER — Encounter: Payer: Self-pay | Admitting: Obstetrics and Gynecology

## 2012-05-04 ENCOUNTER — Ambulatory Visit (INDEPENDENT_AMBULATORY_CARE_PROVIDER_SITE_OTHER): Payer: BC Managed Care – PPO | Admitting: Obstetrics and Gynecology

## 2012-05-04 ENCOUNTER — Other Ambulatory Visit (HOSPITAL_COMMUNITY)
Admission: RE | Admit: 2012-05-04 | Discharge: 2012-05-04 | Disposition: A | Discharge status: Home / Self Care | Payer: BC Managed Care – PPO | Source: Ambulatory Visit | Attending: Obstetrics and Gynecology | Admitting: Obstetrics and Gynecology

## 2012-05-04 VITALS — BP 124/74 | Ht 64.0 in | Wt 220.0 lb

## 2012-05-04 DIAGNOSIS — Z01419 Encounter for gynecological examination (general) (routine) without abnormal findings: Secondary | ICD-10-CM

## 2012-05-04 NOTE — Patient Instructions (Signed)
Continue yearly mammograms 

## 2012-05-04 NOTE — Progress Notes (Signed)
Patient came to see me today for her annual GYN exam. She will occasionally get hot flashes and sleep disturbance but generally is doing well. She is having no vaginal bleeding. She is having no pelvic pain. 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 She's had normal Pap smears since then. Her last Pap smear was 2012. She has scheduled her yearly mammogram. Her last bone density was last year and was normal. Her mother had peritoneal cancer-see pathology report in chart. She actually is 5 years out and still doing well. She unfortunately does have dementia.  Physical examination:Kylie Davis present. HEENT within normal limits. Neck: Thyroid not large. No masses. Supraclavicular nodes: not enlarged. Breasts: Examined in both sitting and lying  position. No skin changes and no masses. Abdomen: Soft no guarding rebound or masses or hernia. Pelvic: External: Within normal limits. BUS: Within normal limits. Vaginal:within normal limits. Good estrogen effect. No evidence of cystocele rectocele or enterocele. Cervix: clean. Uterus: Normal size and shape. Adnexa: No masses. Rectovaginal exam: Confirmatory and negative. Extremities: Within normal limits.  Assessment: Normal GYN exam  Plan: Mammogram. Discussed BRCA1 and BRCA2 testing. Patient does not appear to be higher risk but will consider doing  next year when the price changes. The new Pap smear guidelines were discussed with the patient. Pap done at patient request. Lab work elsewhere.

## 2012-05-06 ENCOUNTER — Other Ambulatory Visit: Payer: Self-pay | Admitting: Gynecology

## 2012-05-06 DIAGNOSIS — Z1231 Encounter for screening mammogram for malignant neoplasm of breast: Secondary | ICD-10-CM

## 2012-07-12 ENCOUNTER — Ambulatory Visit
Admission: RE | Admit: 2012-07-12 | Discharge: 2012-07-12 | Disposition: A | Discharge status: Home / Self Care | Payer: BC Managed Care – PPO | Source: Ambulatory Visit | Attending: Gynecology | Admitting: Gynecology

## 2012-07-12 DIAGNOSIS — Z1231 Encounter for screening mammogram for malignant neoplasm of breast: Secondary | ICD-10-CM

## 2012-09-05 ENCOUNTER — Telehealth: Payer: Self-pay | Admitting: Family Medicine

## 2012-09-05 NOTE — Telephone Encounter (Signed)
PT was seen in the ED this past weekend for elevated BP. She's still feeling bad and would like to see Dr. Tawanna Cooler ASAP. Please assist.

## 2012-09-05 NOTE — Telephone Encounter (Signed)
appt scheduled for tomorrow at 52

## 2012-09-06 ENCOUNTER — Ambulatory Visit (INDEPENDENT_AMBULATORY_CARE_PROVIDER_SITE_OTHER): Payer: BC Managed Care – PPO | Admitting: Family Medicine

## 2012-09-06 ENCOUNTER — Encounter: Payer: Self-pay | Admitting: Family Medicine

## 2012-09-06 VITALS — BP 144/90 | HR 80 | Temp 98.6°F | Resp 18 | Ht 64.0 in | Wt 212.0 lb

## 2012-09-06 DIAGNOSIS — R609 Edema, unspecified: Secondary | ICD-10-CM

## 2012-09-06 DIAGNOSIS — J309 Allergic rhinitis, unspecified: Secondary | ICD-10-CM

## 2012-09-06 DIAGNOSIS — E663 Overweight: Secondary | ICD-10-CM

## 2012-09-06 DIAGNOSIS — I10 Essential (primary) hypertension: Secondary | ICD-10-CM | POA: Insufficient documentation

## 2012-09-06 MED ORDER — HYDROCHLOROTHIAZIDE 25 MG PO TABS: 25.0000 mg | ORAL_TABLET | ORAL | Status: DC

## 2012-09-06 NOTE — Patient Instructions (Signed)
Take the HCTZ 25 mg one tab daily  Purchase a digital blood pressure cuff,,,,,,, [employer],,,,,,,,,, the product is omron,,,,,,,,, digital pump up blood pressure cuff  Check your blood pressure daily in the morning  Return in 2 weeks for followup with the blood pressure cuff and the data  Salt free diet  Walk 30 minutes daily  Zyrtec plain 10 mg at bedtime  One shot of steroid nasal spray up each nostril at bedtime

## 2012-09-06 NOTE — Progress Notes (Signed)
  Subjective:    Patient ID: Kylie Davis, female    DOB: Nov 26, 1950, 62 y.o.   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 For she went to [facility], then she went to an urgent care, then she went to an emergency room for evaluation of elevated blood pressure. She states the emergency room her blood pressure is 190/100. She had numerous diagnostic studies all of which were normal. She was taking HCTZ 25 mg daily to help with peripheral edema however she hasn't taken that in over a month  She's also had a week's worth of allergic symptoms head congestion postnasal drip and cough.  Review of Systems    review of systems otherwise negative Objective:   Physical Exam Well-developed well-nourished female no acute distress HEENT negative neck was supple no adenopathy lungs are clear  BP right arm sitting position 140/80       Assessment & Plan:  Allergic rhinitis over-the-counter Zyrtec and steroid nasal spray  Hypertension restart hydrochlorothiazide 1 daily BP check daily followup in 2 weeks

## 2012-09-22 ENCOUNTER — Ambulatory Visit: Payer: BC Managed Care – PPO | Admitting: Family Medicine

## 2012-10-12 ENCOUNTER — Ambulatory Visit (INDEPENDENT_AMBULATORY_CARE_PROVIDER_SITE_OTHER): Payer: BC Managed Care – PPO | Admitting: Family Medicine

## 2012-10-12 ENCOUNTER — Encounter: Payer: Self-pay | Admitting: Family Medicine

## 2012-10-12 VITALS — BP 150/90 | Temp 97.8°F | Wt 231.0 lb

## 2012-10-12 DIAGNOSIS — I10 Essential (primary) hypertension: Secondary | ICD-10-CM

## 2012-10-12 DIAGNOSIS — M25569 Pain in unspecified knee: Secondary | ICD-10-CM

## 2012-10-12 DIAGNOSIS — M25562 Pain in left knee: Secondary | ICD-10-CM

## 2012-10-12 MED ORDER — LISINOPRIL-HYDROCHLOROTHIAZIDE 10-12.5 MG PO TABS: 1.0000 | ORAL_TABLET | Freq: Every day | ORAL | Status: DC

## 2012-10-12 NOTE — Patient Instructions (Signed)
Stop the hydrochlorothiazide  Begin Zestoretic,,,,,,,,, one tablet daily in the morning  Purchase a digital pump up blood pressure cuff,,,,,,,,,,, or if you can purchase a new cuff alone that might work just as well  Check your blood pressure daily in the morning  Return in 2 weeks for followup  Elevation and ice and Motrin 400 mg twice daily for your knee pain  If we don't see any improvement or gets worse I would recommend Dr. Albertha Ghee with Dr. [facility] Register group   If the clicking in the jaw gets worse I would recommend he see an oral surgeon Dr. Manson Passey

## 2012-10-12 NOTE — Progress Notes (Signed)
  Subjective:    Patient ID: Kylie Davis, female    DOB: 02-21-1951, 62 y.o.   MRN: [id number]  HPI Kylie Davis is a 62 year old female married nonsmoker who comes in today for evaluation of 3 problems  She's been on hydrochlorothiazide 25 mg daily however BP is 160/90. She brings in a cough however the cuff is inaccurate. Her digital cuff reads 149/80 however her blood pressure is 160/90.  She's having trouble with clicking in her right jaw no pain  She's also pain in the medial portion of her left knee. We saw her for this previously and felt like she might have a small meniscus tear. She used over-the-counter medications elevation ice and the pain resolved now it's back. No locking no edema   Review of Systems    review of systems otherwise negative Objective:   Physical Exam  Well-developed well nourished female no acute distress BP right arm sitting position 160/90      Assessment & Plan:  Hypertension not at goal plan DC hydrochlorothiazide ,,,,,,,,,,, start Zestoretic  Pain at medial portion left knee,,,,,,,, C. Instructions  Click in left jaw oral surgery consult when necessary

## 2012-10-26 ENCOUNTER — Encounter: Payer: Self-pay | Admitting: Family Medicine

## 2012-10-26 ENCOUNTER — Ambulatory Visit (INDEPENDENT_AMBULATORY_CARE_PROVIDER_SITE_OTHER): Payer: BC Managed Care – PPO | Admitting: Family Medicine

## 2012-10-26 VITALS — BP 130/78 | Temp 98.5°F | Wt 215.0 lb

## 2012-10-26 DIAGNOSIS — I10 Essential (primary) hypertension: Secondary | ICD-10-CM

## 2012-10-26 LAB — BASIC METABOLIC PANEL
CO2: 27 mEq/L (ref 19–32)
Calcium: 9.4 mg/dL (ref 8.4–10.5)
Chloride: 99 mEq/L (ref 96–112)
Sodium: 137 mEq/L (ref 135–145)

## 2012-10-26 NOTE — Patient Instructions (Addendum)
Continue the Zestoretic one daily  Walk 30 minutes daily  Avoid salt  We will call you about your lab work  Since your blood pressures normal I would just check it once weekly in the morning  If you get an abnormal reading then I would check it daily for 2 weeks,,,,,,,,,, if after 2 weeks of data gathering your blood pressures are all elevated than return to see [place] with the data and the device  If however after checking your blood pressure for 2 weeks in a row a drop back to normal.......... Then go back to just checking it once weekly

## 2012-10-26 NOTE — Progress Notes (Signed)
  Subjective:    Patient ID: Kylie Davis, female    DOB: [birth year], 62 y.o.   MRN: [id number]  HPI [name] is a 62 year old married female nonsmoker who comes in today for evaluation of hypertension  She was on hydrochlorothiazide 25 mg daily however blood pressure 1 up. We changed her to Zestoretic 10-12.5 and blood pressure is now normal. BP right arm sitting position 130/80   Review of Systems Review of systems negative no cough or hives    Objective:   Physical Exam Well-developed and nourished female no acute distress BP right arm sitting position 130/80       Assessment & Plan:  Hypertension at goal on Zestoretic 10-12.5,,,,,,,, continue medicine daily,,,, check be met

## 2012-12-22 ENCOUNTER — Encounter: Payer: Self-pay | Admitting: Internal Medicine

## 2013-01-06 ENCOUNTER — Telehealth: Payer: Self-pay | Admitting: Family Medicine

## 2013-01-06 NOTE — Telephone Encounter (Signed)
Pt stated lisinopril is causing her a cough. [employer] [place]. Please advise

## 2013-01-11 MED ORDER — LOSARTAN POTASSIUM-HCTZ 50-12.5 MG PO TABS: ORAL_TABLET | ORAL | Status: DC

## 2013-01-11 NOTE — Telephone Encounter (Signed)
Per Dr Tawanna Cooler patient to stop lisinopril, start hyzaar, keep a record of bp.  Patient is aware.  New rx sent to pharmacy

## 2013-02-08 ENCOUNTER — Ambulatory Visit (AMBULATORY_SURGERY_CENTER): Payer: Self-pay | Admitting: *Deleted

## 2013-02-08 VITALS — Ht 64.0 in | Wt 219.0 lb

## 2013-02-08 DIAGNOSIS — Z1211 Encounter for screening for malignant neoplasm of colon: Secondary | ICD-10-CM

## 2013-02-08 MED ORDER — MOVIPREP 100 G PO SOLR: ORAL | Status: DC

## 2013-02-08 NOTE — Progress Notes (Signed)
Patient denies any allergies to eggs or soy. Patient denies any problems with anesthesia.  

## 2013-02-09 ENCOUNTER — Encounter: Payer: Self-pay | Admitting: Internal Medicine

## 2013-02-20 ENCOUNTER — Other Ambulatory Visit: Payer: Self-pay | Admitting: Family Medicine

## 2013-02-24 ENCOUNTER — Encounter: Payer: Self-pay | Admitting: Internal Medicine

## 2013-02-24 ENCOUNTER — Ambulatory Visit (AMBULATORY_SURGERY_CENTER): Payer: BC Managed Care – PPO | Admitting: Internal Medicine

## 2013-02-24 VITALS — BP 133/70 | HR 84 | Temp 97.6°F | Resp 25 | Ht 64.0 in | Wt 219.0 lb

## 2013-02-24 DIAGNOSIS — Z1211 Encounter for screening for malignant neoplasm of colon: Secondary | ICD-10-CM

## 2013-02-24 HISTORY — PX: COLONOSCOPY: SHX174

## 2013-02-24 MED ORDER — SODIUM CHLORIDE 0.9 % IV SOLN: 500.0000 mL | INTRAVENOUS | Status: DC

## 2013-02-24 NOTE — Op Note (Signed)
[facility] [address] [place], [address]   COLONOSCOPY PROCEDURE REPORT  PATIENT: Kylie Davis, Kylie Davis  MR#: [id number] BIRTHDATE: 1950/07/23 , 62  yrs. old GENDER: Female ENDOSCOPIST: Hart Carwin, MD REFERRED [id number] Shawnie Dapper, M.D. PROCEDURE DATE:  02/24/2013 PROCEDURE:   Colonoscopy, screening First Screening Colonoscopy - Avg.  risk and is 50 yrs.  old or older - No.  Prior Negative Screening - Now for repeat screening. 10 or more years since last screening  History of Adenoma - Now for follow-up colonoscopy & has been > or = to 3 yrs.  N/A  Polyps Removed Today? No.  Recommend repeat exam, <10 yrs? No. ASA CLASS:   Class II INDICATIONS:Average risk patient for colon cancer. MEDICATIONS: MAC sedation, administered by CRNA and propofol (Diprivan) 200mg  IV  DESCRIPTION OF PROCEDURE:   After the risks benefits and alternatives of the procedure were thoroughly explained, informed consent was obtained.  A digital rectal exam revealed no abnormalities of the rectum.   The LB [id number]-[id number]  endoscope was introduced through the anus and advanced to the cecum, which was identified by both the appendix and ileocecal valve. No adverse events experienced.   The quality of the prep was excellent, using MoviPrep  The instrument was then slowly withdrawn as the colon was fully examined.      COLON FINDINGS: A normal appearing cecum, ileocecal valve, and appendiceal orifice were identified.  The ascending, hepatic flexure, transverse, splenic flexure, descending, sigmoid colon and rectum appeared unremarkable.  No polyps or cancers were seen. Retroflexed views revealed no abnormalities. The time to cecum=3 minutes 55 seconds.  Withdrawal time=6 minutes 15 seconds.  The scope was withdrawn and the procedure completed. COMPLICATIONS: There were no complications.  ENDOSCOPIC IMPRESSION: Normal colon  RECOMMENDATIONS: 1.  High fiber diet 2.   recall colon 10  years   eSigned:  Hart Carwin, MD 02/24/2013 10:48 AM   cc:   PATIENT NAME:  [name], [name] MR#: [id number]

## 2013-02-24 NOTE — Patient Instructions (Signed)
Discharge instructions given with verbal understanding. Normal exam. Resume previous medications. YOU HAD AN ENDOSCOPIC PROCEDURE TODAY AT THE [facility]: Refer to the procedure report that was given to you for any specific questions about what was found during the examination.  If the procedure report does not answer your questions, please call your gastroenterologist to clarify.  If you requested that your care partner not be given the details of your procedure findings, then the procedure report has been included in a sealed envelope for you to review at your convenience later.  YOU SHOULD EXPECT: Some feelings of bloating in the abdomen. Passage of more gas than usual.  Walking can help get rid of the air that was put into your GI tract during the procedure and reduce the bloating. If you had a lower endoscopy (such as a colonoscopy or flexible sigmoidoscopy) you may notice spotting of blood in your stool or on the toilet paper. If you underwent a bowel prep for your procedure, then you may not have a normal bowel movement for a few days.  DIET: Your first meal following the procedure should be a light meal and then it is ok to progress to your normal diet.  A half-sandwich or bowl of soup is an example of a good first meal.  Heavy or fried foods are harder to digest and may make you feel nauseous or bloated.  Likewise meals heavy in dairy and vegetables can cause extra gas to form and this can also increase the bloating.  Drink plenty of fluids but you should avoid alcoholic beverages for 24 hours.  ACTIVITY: Your care partner should take you home directly after the procedure.  You should plan to take it easy, moving slowly for the rest of the day.  You can resume normal activity the day after the procedure however you should NOT DRIVE or use heavy machinery for 24 hours (because of the sedation medicines used during the test).    SYMPTOMS TO REPORT IMMEDIATELY: A gastroenterologist  can be reached at any hour.  During normal business hours, 8:30 AM to 5:00 PM Monday through Friday, call [phone].  After hours and on weekends, please call the GI answering service at [phone] who will take a message and have the physician on call contact you.   Following lower endoscopy (colonoscopy or flexible sigmoidoscopy):  Excessive amounts of blood in the stool  Significant tenderness or worsening of abdominal pains  Swelling of the abdomen that is new, acute  Fever of 100F or higher  FOLLOW UP: If any biopsies were taken you will be contacted by phone or by letter within the next 1-3 weeks.  Call your gastroenterologist if you have not heard about the biopsies in 3 weeks.  Our staff will call the home number listed on your records the next business day following your procedure to check on you and address any questions or concerns that you may have at that time regarding the information given to you following your procedure. This is a courtesy call and so if there is no answer at the home number and we have not heard from you through the emergency physician on call, we will assume that you have returned to your regular daily activities without incident.  SIGNATURES/CONFIDENTIALITY: You and/or your care partner have signed paperwork which will be entered into your electronic medical record.  These signatures attest to the fact that that the information above on your After Visit Summary has been reviewed   and is understood.  Full responsibility of the confidentiality of this discharge information lies with you and/or your care-partner. 

## 2013-02-24 NOTE — Progress Notes (Signed)
Patient did not experience any of the following events: a burn prior to discharge; a fall within the facility; wrong site/side/patient/procedure/implant event; or a hospital transfer or hospital admission upon discharge from the facility. (G8907) Patient did not have preoperative order for IV antibiotic SSI prophylaxis. (G8918)  

## 2013-02-24 NOTE — Progress Notes (Signed)
Procedure ends, to recovery, report given and VSS. 

## 2013-02-27 ENCOUNTER — Telehealth: Payer: Self-pay | Admitting: *Deleted

## 2013-02-27 NOTE — Telephone Encounter (Signed)
  Follow up Call-  Call back number 02/24/2013  Post procedure Call Back phone  # [phone]  Permission to leave phone message Yes     Patient questions:  Do you have a fever, pain , or abdominal swelling? no Pain Score  0 *  Have you tolerated food without any problems? yes  Have you been able to return to your normal activities? yes  Do you have any questions about your discharge instructions: Diet   no Medications  no Follow up visit  no  Do you have questions or concerns about your Care? no  Actions: * If pain score is 4 or above: No action needed, pain <4.  Stated you guys were wonderful.

## 2013-04-06 ENCOUNTER — Ambulatory Visit (INDEPENDENT_AMBULATORY_CARE_PROVIDER_SITE_OTHER): Payer: BC Managed Care – PPO | Admitting: Family

## 2013-04-06 ENCOUNTER — Encounter: Payer: Self-pay | Admitting: Family

## 2013-04-06 VITALS — BP 130/70 | HR 97 | Temp 98.5°F | Wt 221.0 lb

## 2013-04-06 DIAGNOSIS — J069 Acute upper respiratory infection, unspecified: Secondary | ICD-10-CM

## 2013-04-06 DIAGNOSIS — R05 Cough: Secondary | ICD-10-CM

## 2013-04-06 DIAGNOSIS — R059 Cough, unspecified: Secondary | ICD-10-CM

## 2013-04-06 DIAGNOSIS — J209 Acute bronchitis, unspecified: Secondary | ICD-10-CM

## 2013-04-06 MED ORDER — PREDNISONE 20 MG PO TABS: ORAL_TABLET | ORAL | Status: AC

## 2013-04-06 NOTE — Progress Notes (Signed)
Subjective:    Patient ID: Kylie Davis, female    DOB: 05-06-1951, 62 y.o.   MRN: [id number]  HPI 62 year old white female, nonsmoker, patient of Dr. Tawanna Cooler is in today with complaints of sore throat, ear pain, fever, cough x2 days. She is then to give Zyrtec and ibuprofen with no relief.   Review of Systems  Constitutional: Positive for fever. Negative for fatigue.  HENT: Positive for ear pain and sore throat.   Respiratory: Positive for cough.   Cardiovascular: Negative.   Endocrine: Negative.   Musculoskeletal: Negative.   Skin: Negative.   Neurological: Negative.   Hematological: Negative.   Psychiatric/Behavioral: Negative.    Past Medical History  Diagnosis Date  . Hypertension     History   Social History  . Marital Status: Married    Spouse Name: N/A    Number of Children: N/A  . Years of Education: N/A   Occupational History  . Not on file.   Social History Main Topics  . Smoking status: Never Smoker   . Smokeless tobacco: Never Used  . Alcohol Use: 2.0 oz/week    4 drink(s) per week  . Drug Use: No  . Sexual Activity: Yes    Birth Control/ Protection: Post-menopausal   Other Topics Concern  . Not on file   Social History Narrative  . No narrative on file    Past Surgical History  Procedure Laterality Date  . Cholecystectomy  2004    Family History  Problem Relation Age of Onset  . Cancer Mother 8    Peritoneal cancer,colon,ovaries  . Alzheimer's disease Mother   . Stomach cancer Mother   . Cancer Father     stomach  . Heart disease Father   . Hypertension Brother   . Diabetes Paternal Grandmother   . Colon cancer Neg Hx     No Known Allergies  Current Outpatient Prescriptions on File Prior to Visit  Medication Sig Dispense Refill  . aspirin 81 MG tablet Take 81 mg by mouth daily.        . calcium-vitamin D (SM CALCIUM 500/VITAMIN D3) 500-400 MG-UNIT per tablet Take 1 tablet by mouth 3 (three) times daily.        Marland Kitchen  losartan-hydrochlorothiazide (HYZAAR) 50-12.5 MG per tablet Take 1/2 tablet by mouth every morning.  45 tablet  5  . multivitamin (THERAGRAN) per tablet Take 1 tablet by mouth daily.         No current facility-administered medications on file prior to visit.    BP 130/70  Pulse 97  Temp(Src) 98.5 F (36.9 C) (Oral)  Wt 221 lb (100.245 kg)chart    Objective:   Physical Exam  Constitutional: She is oriented to person, place, and time. She appears well-developed and well-nourished.  HENT:  Right Ear: External ear normal.  Left Ear: External ear normal.  Nose: Nose normal.  Mouth/Throat: Oropharynx is clear and moist.  Neck: Normal range of motion. Neck supple.  Cardiovascular: Normal rate, regular rhythm and normal heart sounds.   Pulmonary/Chest: Effort normal. She has wheezes.  Neurological: She is alert and oriented to person, place, and time.  Skin: Skin is warm and dry.  Psychiatric: She has a normal mood and affect.          Assessment & Plan:  Assessment: 1. Upper respiratory infection 2. Acute bronchitis  Plan: Prednisone 60x3, 40x3, 20x3. Drink plenty of fluids. Patient probably office if symptoms worsen or persist. Recheck as scheduled, and as needed

## 2013-04-06 NOTE — Patient Instructions (Signed)
Upper Respiratory Infection, Adult  An upper respiratory infection (URI) is also known as the common cold. It is often caused by a type of germ (virus). Colds are easily spread (contagious). You can pass it to others by kissing, coughing, sneezing, or drinking out of the same glass. Usually, you get better in 1 or 2 weeks.   HOME CARE    Only take medicine as told by your doctor.   Use a warm mist humidifier or breathe in steam from a hot shower.   Drink enough water and fluids to keep your pee (urine) clear or pale yellow.   Get plenty of rest.   Return to work when your temperature is back to normal or as told by your doctor. You may use a face mask and wash your hands to stop your cold from spreading.  GET HELP RIGHT AWAY IF:    After the first few days, you feel you are getting worse.   You have questions about your medicine.   You have chills, shortness of breath, or brown or red spit (mucus).   You have yellow or brown snot (nasal discharge) or pain in the face, especially when you bend forward.   You have a fever, puffy (swollen) neck, pain when you swallow, or white spots in the back of your throat.   You have a bad headache, ear pain, sinus pain, or chest pain.   You have a high-pitched whistling sound when you breathe in and out (wheezing).   You have a lasting cough or cough up blood.   You have sore muscles or a stiff neck.  MAKE SURE YOU:    Understand these instructions.   Will watch your condition.   Will get help right away if you are not doing well or get worse.  Document Released: 10/21/2007 Document Revised: 07/27/2011 Document Reviewed: 09/08/2010  [employer] Patient Information 2014 [employer]    Bronchitis  Bronchitis is the body's way of reacting to injury and/or infection (inflammation) of the bronchi. Bronchi are the air tubes that extend from the windpipe into the lungs. If the inflammation becomes severe, it may cause shortness of breath.  CAUSES   Inflammation may be  caused by:   A virus.   Germs (bacteria).   Dust.   Allergens.   Pollutants and many other irritants.  The cells lining the bronchial tree are covered with tiny hairs (cilia). These constantly beat upward, away from the lungs, toward the mouth. This keeps the lungs free of pollutants. When these cells become too irritated and are unable to do their job, mucus begins to develop. This causes the characteristic cough of bronchitis. The cough clears the lungs when the cilia are unable to do their job. Without either of these protective mechanisms, the mucus would settle in the lungs. Then you would develop pneumonia.  Smoking is a common cause of bronchitis and can contribute to pneumonia. Stopping this habit is the single most important thing you can do to help yourself.  TREATMENT    Your caregiver may prescribe an antibiotic if the cough is caused by bacteria. Also, medicines that open up your airways make it easier to breathe. Your caregiver may also recommend or prescribe an expectorant. It will loosen the mucus to be coughed up. Only take over-the-counter or prescription medicines for pain, discomfort, or fever as directed by your caregiver.   Removing whatever causes the problem (smoking, for example) is critical to preventing the problem from getting worse.     Cough suppressants may be prescribed for relief of cough symptoms.   Inhaled medicines may be prescribed to help with symptoms now and to help prevent problems from returning.   For those with recurrent (chronic) bronchitis, there may be a need for steroid medicines.  SEEK IMMEDIATE MEDICAL CARE IF:    During treatment, you develop more pus-like mucus (purulent sputum).   You have a fever.   You become progressively more ill.   You have increased difficulty breathing, wheezing, or shortness of breath.  It is necessary to seek immediate medical care if you are elderly or sick from any other disease.  MAKE SURE YOU:    Understand these  instructions.   Will watch your condition.   Will get help right away if you are not doing well or get worse.  Document Released: 05/04/2005 Document Revised: 01/04/2013 Document Reviewed: 12/27/2012  [employer] Patient Information 2014 [employer]

## 2013-05-15 ENCOUNTER — Encounter: Payer: Self-pay | Admitting: Gynecology

## 2013-05-15 ENCOUNTER — Ambulatory Visit (INDEPENDENT_AMBULATORY_CARE_PROVIDER_SITE_OTHER): Payer: BC Managed Care – PPO | Admitting: Gynecology

## 2013-05-15 VITALS — BP 138/78 | Ht 64.0 in | Wt 218.0 lb

## 2013-05-15 DIAGNOSIS — Z01419 Encounter for gynecological examination (general) (routine) without abnormal findings: Secondary | ICD-10-CM

## 2013-05-15 DIAGNOSIS — N952 Postmenopausal atrophic vaginitis: Secondary | ICD-10-CM

## 2013-05-15 NOTE — Progress Notes (Signed)
Kylie Davis 10/03/1950 [id number]        62 y.o.  G0P0 for annual exam.  Former patient of Dr. Eda Davis. Several issues noted below.  Past medical history,surgical history, problem list, medications, allergies, family history and social history were all reviewed and documented in the EPIC chart.  ROS:  Performed and pertinent positives and negatives are included in the history, assessment and plan .  Exam: Kylie Davis assistant Filed Vitals:   05/15/13 1010  BP: 138/78  Height: 5\' 4"  (1.626 m)  Weight: 218 lb (98.884 kg)   General appearance  Normal Skin grossly normal Head/Neck normal with no cervical or supraclavicular adenopathy thyroid normal Lungs  clear Cardiac RR, without RMG Abdominal  soft, nontender, without masses, organomegaly or hernia Breasts  examined lying and sitting without masses, retractions, discharge or axillary adenopathy. Pelvic  Ext/BUS/vagina  Normal with atrophic changes  Cervix  Normal with atrophic changes  Uterus  anteverted, normal size, shape and contour, midline and mobile nontender   Adnexa  Without masses or tenderness    Anus and perineum  Normal   Rectovaginal  Normal sphincter tone without palpated masses or tenderness.    Assessment/Plan:  62 y.o. [name] for annual exam.   1. Postmenopausal/atrophic genital changes. Patient without symptoms of hot flushes, night sweats, vaginal dryness or dyspareunia. No vaginal bleeding. Will continue to monitor. Call if any vaginal bleeding. 2. Mother developed primary peritoneal carcinoma at age 65. No other family history of breast cancer or ovarian cancer. Had discussed BRCA testing with Dr. Eda Davis previously. She has elected not to do this. I again reviewed this with her and offered the option to discuss it with a Dentist. Patient does understand even if gene negative she may be a carrier for an undetected change.  Regardless patient declines screening. 3. Mammography 06/2012. Continue with  annual mammography. SBE monthly reviewed. 4. Pap smear 04/2012. No Pap smear done today. 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 Verl Davis note. Normal Pap smears since then. Plan repeat Pap smear in 3 year interval. 5. Colonoscopy 02/2013. Repeat at their recommended interval. 6. Health maintenance. No blood work done as this is done through her primary physician's office. Followup one year, sooner as needed.   Note: This document was prepared with digital dictation and possible smart phrase technology. Any transcriptional errors that result from this process are unintentional.   Kylie Lords MD, 10:33 AM 05/15/2013

## 2013-05-15 NOTE — Patient Instructions (Signed)
Followup in one year, sooner as needed.  Report any vaginal bleeding. 

## 2013-05-16 LAB — URINALYSIS W MICROSCOPIC + REFLEX CULTURE
Bilirubin Urine: NEGATIVE
Leukocytes, UA: NEGATIVE
Specific Gravity, Urine: 1.026 (ref 1.005–1.030)
Squamous Epithelial / LPF: NONE SEEN
Urobilinogen, UA: 0.2 mg/dL (ref 0.0–1.0)

## 2013-06-01 ENCOUNTER — Telehealth: Payer: Self-pay | Admitting: Family Medicine

## 2013-06-01 NOTE — Telephone Encounter (Signed)
Patient Information:  Caller Name: Takeira  Phone: [phone]  Patient: Kylie Davis, Kylie Davis  Gender: Female  DOB: May 29, 1950  Age: 63 Years  PCP: [facility])  Office Follow Up:  Does the office need to follow up with this patient?: No  Instructions For The Office: N/A  RN Note:   Place ice on the bruise to help it heal faster and to reduce swelling. Wrap the ice in a clean towel -- do not place ice directly on the skin. Apply the ice for up to 15 minutes each hour.   Keep the bruised area raised above the heart, if possible. This helps keep blood from pooling in the bruised tissue.   Try to rest the bruised body part by not overworking your muscles in that area.   If needed, take acetaminophen (Tylenol) to help reduce pain  Symptoms  Reason For Call & Symptoms: Pt states she fell on the left hip on ice 05/31/13.  And has bruise, asking about how to care for bruise.  Reviewed Health History In EMR: Yes  Reviewed Medications In EMR: Yes  Reviewed Allergies In EMR: Yes  Reviewed Surgeries / Procedures: Yes  Date of Onset of Symptoms: 05/31/2013  Guideline(s) Used:  No Protocol Available - Information Only  Disposition Per Guideline:   Home Care  Reason For Disposition Reached:   Information only question and nurse able to answer  Advice Given:  Call Back If:  New symptoms develop  You become worse.  Patient Will Follow Care Advice:  YES

## 2013-06-13 ENCOUNTER — Ambulatory Visit (INDEPENDENT_AMBULATORY_CARE_PROVIDER_SITE_OTHER): Payer: BC Managed Care – PPO | Admitting: Family Medicine

## 2013-06-13 ENCOUNTER — Encounter: Payer: Self-pay | Admitting: Family Medicine

## 2013-06-13 VITALS — BP 130/80 | Temp 98.8°F | Wt 219.0 lb

## 2013-06-13 DIAGNOSIS — M25569 Pain in unspecified knee: Secondary | ICD-10-CM

## 2013-06-13 DIAGNOSIS — J069 Acute upper respiratory infection, unspecified: Secondary | ICD-10-CM

## 2013-06-13 DIAGNOSIS — M25562 Pain in left knee: Secondary | ICD-10-CM

## 2013-06-13 MED ORDER — LOSARTAN POTASSIUM-HCTZ 50-12.5 MG PO TABS: ORAL_TABLET | ORAL | Status: DC

## 2013-06-13 MED ORDER — HYDROCODONE-HOMATROPINE 5-1.5 MG/5ML PO SYRP: ORAL_SOLUTION | ORAL | Status: DC

## 2013-06-13 NOTE — Progress Notes (Signed)
   Subjective:    Patient ID: Kylie Davis, female    DOB: 08-12-1950, 63 y.o.   MRN: [id number]  HPI Charline is a 63 year old married female nonsmoker who comes in today for evaluation of multiple issues because she and her husband are going to be out of town for 2 months and the [place] on their boat  She fell 2 weeks ago sustained a contusion of her left buttocks.  Shows a viral syndrome for one day  And she would like some her moles checked  She also has hypertension on Hyzaar and would like her pharmacy switched to [facility]   Review of Systems    review of systems negative physical exam scheduled for April Objective:   Physical Exam Well-developed well-nourished female no acute distress vital signs stable she's afebrile BP 130/80. Examination of buttocks is a healing hematoma left buttocks  Examination neck shows skin tags       Assessment & Plan:  Contusion left proximal resolving spontaneously  Skin tags reassured  Viral syndrome treat symptomatically  Hypertension renew medication

## 2013-06-13 NOTE — Patient Instructions (Signed)
For your cold drink lots of water,,,,, vaporizer,,,, Hydromet 1/2-1 teaspoon at bedtime when necessary for nighttime cough and cold  Restart your aspirin  Continue your blood pressure medication one tablet daily  Followup in April is for your physical

## 2013-06-13 NOTE — Progress Notes (Signed)
Pre visit review using our clinic review tool, if applicable. No additional management support is needed unless otherwise documented below in the visit note. 

## 2013-09-04 ENCOUNTER — Other Ambulatory Visit (INDEPENDENT_AMBULATORY_CARE_PROVIDER_SITE_OTHER): Payer: BC Managed Care – PPO

## 2013-09-04 DIAGNOSIS — Z0000 Encounter for general adult medical examination without abnormal findings: Secondary | ICD-10-CM

## 2013-09-04 LAB — POCT URINALYSIS DIPSTICK
Bilirubin, UA: NEGATIVE
GLUCOSE UA: NEGATIVE
Ketones, UA: NEGATIVE
Leukocytes, UA: NEGATIVE
Nitrite, UA: NEGATIVE
PH UA: 6.5
PROTEIN UA: NEGATIVE
SPEC GRAV UA: 1.015
UROBILINOGEN UA: 0.2

## 2013-09-04 LAB — CBC WITH DIFFERENTIAL/PLATELET
BASOS PCT: 0.6 % (ref 0.0–3.0)
Basophils Absolute: 0 10*3/uL (ref 0.0–0.1)
EOS PCT: 5 % (ref 0.0–5.0)
Eosinophils Absolute: 0.3 10*3/uL (ref 0.0–0.7)
HEMATOCRIT: 39.5 % (ref 36.0–46.0)
HEMOGLOBIN: 13.3 g/dL (ref 12.0–15.0)
LYMPHS PCT: 35.3 % (ref 12.0–46.0)
Lymphs Abs: 2.3 10*3/uL (ref 0.7–4.0)
MCHC: 33.6 g/dL (ref 30.0–36.0)
MCV: 89.1 fl (ref 78.0–100.0)
MONOS PCT: 9 % (ref 3.0–12.0)
Monocytes Absolute: 0.6 10*3/uL (ref 0.1–1.0)
NEUTROS ABS: 3.3 10*3/uL (ref 1.4–7.7)
Neutrophils Relative %: 50.1 % (ref 43.0–77.0)
Platelets: 329 10*3/uL (ref 150.0–400.0)
RBC: 4.43 Mil/uL (ref 3.87–5.11)
RDW: 14.8 % — ABNORMAL HIGH (ref 11.5–14.6)
WBC: 6.6 10*3/uL (ref 4.5–10.5)

## 2013-09-04 LAB — BASIC METABOLIC PANEL
BUN: 14 mg/dL (ref 6–23)
CALCIUM: 8.9 mg/dL (ref 8.4–10.5)
CO2: 28 mEq/L (ref 19–32)
Chloride: 102 mEq/L (ref 96–112)
Creatinine, Ser: 0.7 mg/dL (ref 0.4–1.2)
GFR: 89.55 mL/min (ref >60.00)
Glucose, Bld: 102 mg/dL — ABNORMAL HIGH (ref 70–99)
POTASSIUM: 4.7 meq/L (ref 3.5–5.1)
SODIUM: 138 meq/L (ref 135–145)

## 2013-09-04 LAB — LIPID PANEL
CHOL/HDL RATIO: 4
Cholesterol: 165 mg/dL (ref 0–200)
HDL: 41.9 mg/dL (ref >39.00)
LDL CALC: 95 mg/dL (ref 0–99)
TRIGLYCERIDES: 141 mg/dL (ref 0.0–149.0)
VLDL: 28.2 mg/dL (ref 0.0–40.0)

## 2013-09-04 LAB — HEPATIC FUNCTION PANEL
ALBUMIN: 3.6 g/dL (ref 3.5–5.2)
ALK PHOS: 65 U/L (ref 39–117)
ALT: 25 U/L (ref 0–35)
AST: 18 U/L (ref 0–37)
Bilirubin, Direct: 0 mg/dL (ref 0.0–0.3)
TOTAL PROTEIN: 7.3 g/dL (ref 6.0–8.3)
Total Bilirubin: 0.4 mg/dL (ref 0.3–1.2)

## 2013-09-04 LAB — TSH: TSH: 4.57 u[IU]/mL (ref 0.35–5.50)

## 2013-09-06 ENCOUNTER — Other Ambulatory Visit: Payer: BC Managed Care – PPO

## 2013-09-13 ENCOUNTER — Ambulatory Visit (INDEPENDENT_AMBULATORY_CARE_PROVIDER_SITE_OTHER): Payer: BC Managed Care – PPO | Admitting: Family Medicine

## 2013-09-13 ENCOUNTER — Encounter: Payer: Self-pay | Admitting: Family Medicine

## 2013-09-13 VITALS — BP 120/78 | Temp 99.0°F | Ht 63.5 in | Wt 218.0 lb

## 2013-09-13 DIAGNOSIS — G4730 Sleep apnea, unspecified: Secondary | ICD-10-CM | POA: Insufficient documentation

## 2013-09-13 DIAGNOSIS — G4710 Hypersomnia, unspecified: Secondary | ICD-10-CM

## 2013-09-13 DIAGNOSIS — R609 Edema, unspecified: Secondary | ICD-10-CM

## 2013-09-13 DIAGNOSIS — E663 Overweight: Secondary | ICD-10-CM

## 2013-09-13 DIAGNOSIS — Z23 Encounter for immunization: Secondary | ICD-10-CM

## 2013-09-13 DIAGNOSIS — I10 Essential (primary) hypertension: Secondary | ICD-10-CM

## 2013-09-13 MED ORDER — LOSARTAN POTASSIUM-HCTZ 50-12.5 MG PO TABS: ORAL_TABLET | ORAL | Status: DC

## 2013-09-13 NOTE — Progress Notes (Signed)
Pre visit review using our clinic review tool, if applicable. No additional management support is needed unless otherwise documented below in the visit note. 

## 2013-09-13 NOTE — Patient Instructions (Signed)
Walk 30 minutes daily  Dietary wise to limit all the carbohydrates  Pulmonary consult evaluation of probable sleep apnea  Hyzaar.......Marland Kitchen 1 daily  Return in one year for general medical examination sooner if any problems

## 2013-09-13 NOTE — Progress Notes (Signed)
   Subjective:    Patient ID: Kylie Davis, female    DOB: 1950-12-03, 63 y.o.   MRN: [id number]  HPI Kylie Davis is a 63 year old married female nonsmoker,,,,,,, retired Radio producer,,,,, who comes in today for general physical examination  She takes Hyzaar 50-12.5 daily for hypertension BP normal 120/80  She gets routine eye care, dental care, BSE monthly, and you mammography, colonoscopy up-to-date on vaccinations up-to-date  She states she feels well but she's been fatigued. Her husband is concerned she might have sleep apnea. She sometimes wakes herself up at night choking and gagging and her husband says she stops breathing at night. She is overweight.  GYN care by GYN. Her mother died of peritoneal cancer. The etiology was most likely ovarian. I will send her to the geneticist at the [facility] for consultation   Review of Systems  Constitutional: Negative.   HENT: Negative.   Eyes: Negative.   Respiratory: Negative.   Cardiovascular: Negative.   Gastrointestinal: Negative.   Genitourinary: Negative.   Musculoskeletal: Negative.   Neurological: Negative.   Psychiatric/Behavioral: Negative.        Objective:   Physical Exam  Nursing note and vitals reviewed. Constitutional: She appears well-developed and well-nourished.  HENT:  Head: Normocephalic and atraumatic.  Right Ear: External ear normal.  Left Ear: External ear normal.  Nose: Nose normal.  Mouth/Throat: Oropharynx is clear and moist.  Eyes: EOM are normal. Pupils are equal, round, and reactive to light.  Neck: Normal range of motion. Neck supple. No thyromegaly present.  Cardiovascular: Normal rate, regular rhythm, normal heart sounds and intact distal pulses.  Exam reveals no gallop and no friction rub.   No murmur heard. No carotid neurologic bruits peripheral pulses 2+ and symmetrical  Pulmonary/Chest: Effort normal and breath sounds normal.  Abdominal: Soft. Bowel sounds are normal. She exhibits  no distension and no mass. There is no tenderness. There is no rebound.  Genitourinary:  Bilateral breast exam normal  Musculoskeletal: Normal range of motion.  Lymphadenopathy:    She has no cervical adenopathy.  Neurological: She is alert. She has normal reflexes. No cranial nerve deficit. She exhibits normal muscle tone. Coordination normal.  Skin: Skin is warm and dry.  Psychiatric: She has a normal mood and affect. Her behavior is normal. Judgment and thought content normal.          Assessment & Plan:  Healthy female  Overweight........ again discussed diet exercise and weight loss  Fatigue with question sleep apnea........ consult with Dr. Lanny Davis  Hypertension at goal continue current therapy  Positive family history of question ovarian cancer.......Marland Kitchen genetics consult

## 2013-09-14 ENCOUNTER — Telehealth: Payer: Self-pay | Admitting: Family Medicine

## 2013-09-14 NOTE — Telephone Encounter (Signed)
Relevant patient education mailed to patient.  

## 2013-10-10 ENCOUNTER — Encounter: Payer: Self-pay | Admitting: Pulmonary Disease

## 2013-10-10 ENCOUNTER — Encounter (INDEPENDENT_AMBULATORY_CARE_PROVIDER_SITE_OTHER): Payer: Self-pay

## 2013-10-10 ENCOUNTER — Ambulatory Visit (INDEPENDENT_AMBULATORY_CARE_PROVIDER_SITE_OTHER): Payer: BC Managed Care – PPO | Admitting: Pulmonary Disease

## 2013-10-10 VITALS — BP 124/82 | HR 74 | Temp 98.0°F | Ht 64.0 in | Wt 222.2 lb

## 2013-10-10 DIAGNOSIS — G4733 Obstructive sleep apnea (adult) (pediatric): Secondary | ICD-10-CM

## 2013-10-10 NOTE — Assessment & Plan Note (Signed)
The patient's history is very suggestive of clinically significant sleep apnea. I have had a long discussion with her about sleep-disordered breathing, and its impact to her quality of life and cardiovascular health. She will need a sleep study for diagnosis, and is an excellent candidate for home sleep testing. The patient is agreeable to this approach.

## 2013-10-10 NOTE — Progress Notes (Signed)
Subjective:    Patient ID: Kylie Davis, female    DOB: 11-16-1950, 63 y.o.   MRN: [id number]  HPI The patient is a 63 year old female who I've been asked to see for possible obstructive sleep apnea. She has been noted to have loud snoring, as well as an abnormal breathing pattern during sleep. She has frequent awakenings at night, and is unrested in the mornings at least 50% of the time. She notes definite inappropriate daytime sleepiness with inactivity during the day, and falls asleep easily watching television or movies in the evening. She has some sleep pressure driving longer distances. The patient's weight is been neutral over the last 2 years, but admits that it has increased significantly since retirement a few years ago. Her Epworth score today is 8   Sleep Questionnaire What time do you typically go to bed?( Between what hours) 10pm 10pm at 1142 on 10/10/13 by Lilli Few, CMA How long does it take you to fall asleep? 20 minutes 20 minutes at 1142 on 10/10/13 by Lilli Few, CMA How many times during the night do you wake up? 4 4 at 1142 on 10/10/13 by Lilli Few, CMA What time do you get out of bed to start your day? 0730 0730 at 1142 on 10/10/13 by Lilli Few, CMA Do you drive or operate heavy machinery in your occupation? No No at 1142 on 10/10/13 by Lilli Few, CMA How much has your weight changed (up or down) over the past two years? (In pounds) 0 oz (0 kg) 0 oz (0 kg) at 1142 on 10/10/13 by Lilli Few, CMA Have you ever had a sleep study before? No No at 1142 on 10/10/13 by Lilli Few, CMA Do you currently use CPAP? No No at 1142 on 10/10/13 by Lilli Few, CMA Do you wear oxygen at any time? No No at 1142 on 10/10/13 by Lilli Few, CMA   Review of Systems  Constitutional: Negative for fever and unexpected weight change.  HENT: Negative for congestion, dental problem, ear pain,  nosebleeds, postnasal drip, rhinorrhea, sinus pressure, sneezing, sore throat and trouble swallowing.   Eyes: Negative for redness and itching.  Respiratory: Negative for cough, chest tightness, shortness of breath and wheezing.   Cardiovascular: Negative for palpitations and leg swelling.  Gastrointestinal: Negative for nausea and vomiting.  Genitourinary: Negative for dysuria.  Musculoskeletal: Negative for joint swelling.  Skin: Negative for rash.  Neurological: Negative for headaches.  Hematological: Does not bruise/bleed easily.  Psychiatric/Behavioral: Negative for dysphoric mood. The patient is not nervous/anxious.        Objective:   Physical Exam Constitutional:  Overweight female, no acute distress  HENT:  Nares patent without discharge  Oropharynx without exudate, palate and uvula are moderately elongated.   Eyes:  Perrla, eomi, no scleral icterus  Neck:  No JVD, no TMG  Cardiovascular:  Normal rate, regular rhythm, no rubs or gallops.  No murmurs        Intact distal pulses  Pulmonary :  Normal breath sounds, no stridor or respiratory distress   No rales, rhonchi, or wheezing  Abdominal:  Soft, nondistended, bowel sounds present.  No tenderness noted.   Musculoskeletal:  mild lower extremity edema noted.  Lymph Nodes:  No cervical lymphadenopathy noted  Skin:  No cyanosis noted  Neurologic:  Alert, appropriate, moves all 4 extremities without obvious deficit.          Assessment & Plan:

## 2013-10-10 NOTE — Patient Instructions (Signed)
Will arrange for home sleep testing, and will call you once the results are available. Work on weight loss

## 2013-10-27 ENCOUNTER — Other Ambulatory Visit: Payer: Self-pay

## 2013-10-27 DIAGNOSIS — Z1231 Encounter for screening mammogram for malignant neoplasm of breast: Secondary | ICD-10-CM

## 2013-11-01 ENCOUNTER — Ambulatory Visit
Admission: RE | Admit: 2013-11-01 | Discharge: 2013-11-01 | Disposition: A | Discharge status: Home / Self Care | Payer: BC Managed Care – PPO | Source: Ambulatory Visit

## 2013-11-01 ENCOUNTER — Telehealth: Payer: Self-pay | Admitting: Pulmonary Disease

## 2013-11-01 DIAGNOSIS — Z1231 Encounter for screening mammogram for malignant neoplasm of breast: Secondary | ICD-10-CM

## 2013-11-01 NOTE — Telephone Encounter (Signed)
HST was ordered 10/10/13.  She reports from her understanding her insurance did not approve this. Please advise PCC's thanks

## 2013-11-03 NOTE — Telephone Encounter (Signed)
Pt's ins did approve her HST [id number] pt aware she will be contacted wk of 11/06/13 Joellen Jersey

## 2013-11-03 NOTE — Telephone Encounter (Signed)
Please advise PCC's if this has been taken care of? thanks

## 2013-11-09 ENCOUNTER — Telehealth: Payer: Self-pay | Admitting: Pulmonary Disease

## 2013-11-09 NOTE — Telephone Encounter (Signed)
Pt pt pick up alice on Tuesday 5/75/0518 she is aware Joellen Jersey

## 2013-11-14 DIAGNOSIS — G4710 Hypersomnia, unspecified: Secondary | ICD-10-CM

## 2013-11-14 DIAGNOSIS — G4730 Sleep apnea, unspecified: Secondary | ICD-10-CM

## 2013-11-20 DIAGNOSIS — G4730 Sleep apnea, unspecified: Secondary | ICD-10-CM

## 2013-11-20 DIAGNOSIS — G4710 Hypersomnia, unspecified: Secondary | ICD-10-CM

## 2013-11-21 ENCOUNTER — Encounter: Payer: Self-pay | Admitting: Pulmonary Disease

## 2013-11-21 ENCOUNTER — Telehealth: Payer: Self-pay | Admitting: Pulmonary Disease

## 2013-11-21 NOTE — Telephone Encounter (Signed)
Let her know that she does have significant sleep apnea, and needs ov to review and talk about treatment.

## 2013-11-22 NOTE — Telephone Encounter (Signed)
[email or web address].Jennifer Castillo, CMA  

## 2013-11-24 NOTE — Telephone Encounter (Signed)
LMTCBx2. Taras Rask, CMA  

## 2013-11-27 NOTE — Telephone Encounter (Signed)
appt set for 11/28/13 at 4:30. [place] Bing, CMA

## 2013-11-27 NOTE — Telephone Encounter (Signed)
Pt returned call & can be reached at [phone].  Satira Anis

## 2013-11-28 ENCOUNTER — Ambulatory Visit (INDEPENDENT_AMBULATORY_CARE_PROVIDER_SITE_OTHER): Payer: BC Managed Care – PPO | Admitting: Pulmonary Disease

## 2013-11-28 ENCOUNTER — Encounter: Payer: Self-pay | Admitting: Pulmonary Disease

## 2013-11-28 VITALS — BP 128/72 | HR 72 | Temp 97.8°F | Ht 64.0 in | Wt 222.0 lb

## 2013-11-28 DIAGNOSIS — G4733 Obstructive sleep apnea (adult) (pediatric): Secondary | ICD-10-CM

## 2013-11-28 NOTE — Patient Instructions (Signed)
Will start you on cpap at a moderate pressure level.  Please call if having tolerance issues. Work on weight reduction followup with me in 8 weeks.

## 2013-11-28 NOTE — Progress Notes (Signed)
   Subjective:    Patient ID: Kylie Davis, female    DOB: 03-Apr-1951, 63 y.o.   MRN: [id number]  HPI The patient comes in today for followup of her recent home sleep test. 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 I have reviewed the study with her in detail, and answered all of her questions.   Review of Systems  Constitutional: Negative for fever and unexpected weight change.  HENT: Negative for congestion, dental problem, ear pain, nosebleeds, postnasal drip, rhinorrhea, sinus pressure, sneezing, sore throat and trouble swallowing.   Eyes: Negative for redness and itching.  Respiratory: Negative for cough, chest tightness, shortness of breath and wheezing.   Cardiovascular: Negative for palpitations and leg swelling.  Gastrointestinal: Negative for nausea and vomiting.  Genitourinary: Negative for dysuria.  Musculoskeletal: Negative for joint swelling.  Skin: Negative for rash.  Neurological: Negative for headaches.  Hematological: Does not bruise/bleed easily.  Psychiatric/Behavioral: Negative for dysphoric mood. The patient is not nervous/anxious.        Objective:   Physical Exam Overweight female in no acute distress Nose without purulence or discharge noted Neck without lymphadenopathy or thyromegaly Lower extremities with no significant edema, no cyanosis Alert and oriented, moves all 4 extremities.       Assessment & Plan:

## 2013-11-28 NOTE — Assessment & Plan Note (Signed)
The patient has moderate obstructive sleep apnea by her recent home sleep test, and I have reviewed the study with her and answered all of her questions. Given her symptoms and impact to quality of life, I have recommended that she treat this aggressively with a trial of CPAP while working on weight loss. The patient is agreeable to this approach. I will set the patient up on cpap at a moderate pressure level to allow for desensitization, and will troubleshoot the device over the next 4-6weeks if needed.  The pt is to call me if having issues with tolerance.  Will then optimize the pressure once patient is able to wear cpap on a consistent basis.

## 2014-01-16 ENCOUNTER — Telehealth: Payer: Self-pay | Admitting: *Deleted

## 2014-01-16 NOTE — Telephone Encounter (Signed)
Pt requested a genetic appt - confirmed 01/19/14 appt w/ pt.

## 2014-01-18 ENCOUNTER — Encounter: Payer: Self-pay | Admitting: Genetic Counselor

## 2014-01-18 ENCOUNTER — Ambulatory Visit (HOSPITAL_BASED_OUTPATIENT_CLINIC_OR_DEPARTMENT_OTHER): Payer: BC Managed Care – PPO | Admitting: Genetic Counselor

## 2014-01-18 ENCOUNTER — Other Ambulatory Visit: Payer: BC Managed Care – PPO

## 2014-01-18 DIAGNOSIS — Z803 Family history of malignant neoplasm of breast: Secondary | ICD-10-CM

## 2014-01-18 DIAGNOSIS — Z8041 Family history of malignant neoplasm of ovary: Secondary | ICD-10-CM

## 2014-01-18 DIAGNOSIS — IMO0002 Reserved for concepts with insufficient information to code with codable children: Secondary | ICD-10-CM

## 2014-01-18 DIAGNOSIS — Z800 Family history of malignant neoplasm of digestive organs: Secondary | ICD-10-CM

## 2014-01-18 NOTE — Progress Notes (Signed)
HISTORY OF PRESENT ILLNESS: Dr. Sherren Mocha requested a cancer genetics consultation for Kylie Davis, a 63 y.o. female, due to a family history of cancer.  Kylie Davis presents to clinic today to discuss the possibility of a hereditary predisposition to cancer, genetic testing, and to further clarify her future cancer risks, as well as potential cancer risk for family members.  Kylie Davis has no personal history of cancer.   Past Medical History  Diagnosis Date   Hypertension     Past Surgical History  Procedure Laterality Date   Cholecystectomy  2004    History   Social History   Marital Status: Married    Spouse Name: N/A    Number of Children: N/A   Years of Education: N/A   Occupational History   retired Pharmacist, hospital    Social History Main Topics   Smoking status: Never Smoker    Smokeless tobacco: Never Used   Alcohol Use: 2.0 oz/week    4 drink(s) per week     Comment: weekly   Drug Use: No   Sexual Activity: Yes    Patent examiner Protection: Post-menopausal   Other Topics Concern   Not on file   Social History Narrative   No narrative on file     FAMILY HISTORY:  During the visit, a 4-generation pedigree was obtained. Significant diagnoses include the following:  Family History  Problem Relation Age of Onset   Cancer Mother 12    primary peritoneal cancer   Alzheimer's disease Mother    Stomach cancer Mother    Cancer Father 51    stomach or pancreatic   Heart disease Father    Hypertension Brother    Heart attack Brother 70   Diabetes Paternal Grandmother    Colon cancer Neg Hx    Cancer Paternal Aunt 52    breast    Kylie Davis's ancestry is of Caucasian descent. There is no known Jewish ancestry or consanguinity.  GENETIC COUNSELING ASSESSMENT: Kylie Davis is a 63 y.o. female with a family history of cancer suggestive of a hereditary predisposition to cancer. We, therefore, discussed and recommended the following at today's visit.    DISCUSSION: We reviewed the characteristics, features and inheritance patterns of hereditary cancer syndromes. We also discussed genetic testing, including the appropriate family members to test, the process of testing, insurance coverage and turn-around-time for results. We discussed the implications of a negative, positive and/or variant of uncertain significant result. We recommended Kylie Davis pursue genetic testing for the [employer] gene panel.   PLAN: Based on our above recommendation, Kylie Davis wished to pursue genetic testing and the blood sample was drawn and will be sent to [facility] for analysis. Results should be available within approximately 5 weeks time, at which point they will be disclosed by telephone to Kylie Davis, as will any additional recommendations warranted by these results. We also encouraged Kylie Davis to remain in contact with cancer genetics annually so that we can continuously update the family history and inform her of any changes in cancer genetics and testing that may be of benefit for this family. Ms.  Davis questions were answered to her satisfaction today. Our contact information was provided should additional questions or concerns arise.   Thank you for the referral and allowing [place] to share in the care of your patient.   The patient was seen for a total of 45 minutes in face-to-face genetic counseling.  This patient was discussed with Dr. Jana Hakim who  agrees with the above.    _______________________________________________________________________ For Office Staff:  Number of people involved in session: 2 Was an Intern/ student involved with case: not applicable

## 2014-01-24 ENCOUNTER — Ambulatory Visit (INDEPENDENT_AMBULATORY_CARE_PROVIDER_SITE_OTHER): Payer: BC Managed Care – PPO | Admitting: Pulmonary Disease

## 2014-01-24 ENCOUNTER — Encounter: Payer: Self-pay | Admitting: Pulmonary Disease

## 2014-01-24 VITALS — BP 124/62 | HR 70 | Temp 98.2°F | Ht 64.0 in | Wt 220.4 lb

## 2014-01-24 DIAGNOSIS — G4733 Obstructive sleep apnea (adult) (pediatric): Secondary | ICD-10-CM

## 2014-01-24 NOTE — Patient Instructions (Signed)
Continue with cpap, and will extend the range of your pressure to 5-20cm Work on weight loss followup with me again in 35mos.

## 2014-01-24 NOTE — Assessment & Plan Note (Signed)
The patient is doing very well on CPAP, and has seen significant improvement in her symptoms. I will increase her upper pressure limit in order to better cover her sleep apnea, and I've encouraged her to work aggressively on weight loss. I would like to see her back in 6 months if doing well.

## 2014-01-24 NOTE — Progress Notes (Signed)
   Subjective:    Patient ID: Kylie Davis, female    DOB: Oct 07, 1950, 63 y.o.   MRN: [id number]  HPI The patient comes in today for followup of her obstructive sleep apnea. She was started on CPAP at the last visit, and has done very well by her download. She has seen significant improvement in her sleep and daytime alertness. She is seeing some mild breakthrough on her current pressure of 5-12 cm of water.   Review of Systems  Constitutional: Negative for fever and unexpected weight change.  HENT: Negative for congestion, dental problem, ear pain, nosebleeds, postnasal drip, rhinorrhea, sinus pressure, sneezing, sore throat and trouble swallowing.   Eyes: Negative for redness and itching.  Respiratory: Negative for cough, chest tightness, shortness of breath and wheezing.   Cardiovascular: Negative for palpitations and leg swelling.  Gastrointestinal: Negative for nausea and vomiting.  Genitourinary: Negative for dysuria.  Musculoskeletal: Negative for joint swelling.  Skin: Negative for rash.  Neurological: Negative for headaches.  Hematological: Does not bruise/bleed easily.  Psychiatric/Behavioral: Negative for dysphoric mood. The patient is not nervous/anxious.        Objective:   Physical Exam Obese female in no acute distress Nose without purulence or discharge noted Neck without lymphadenopathy or thyromegaly No skin breakdown or pressure necrosis from the CPAP mask Lower extremities without edema, no cyanosis Alert and oriented, does not appear to be sleepy, moves all 4 extremities.       Assessment & Plan:

## 2014-02-20 ENCOUNTER — Encounter: Payer: Self-pay | Admitting: Genetic Counselor

## 2014-02-20 DIAGNOSIS — Z800 Family history of malignant neoplasm of digestive organs: Secondary | ICD-10-CM

## 2014-02-20 DIAGNOSIS — Z803 Family history of malignant neoplasm of breast: Secondary | ICD-10-CM

## 2014-02-20 DIAGNOSIS — Z8041 Family history of malignant neoplasm of ovary: Secondary | ICD-10-CM

## 2014-02-20 NOTE — Progress Notes (Signed)
HPI:  Ms. Yager was previously seen in the [place] clinic due to a family history of cancer and concerns regarding a hereditary predisposition to cancer. Please refer to our prior cancer genetics clinic note for more information regarding Ms. Katen's medical, social and family histories, and our assessment and recommendations, at the time. Ms. Grauberger recent genetic test results were disclosed to her, as were recommendations warranted by these results. These results and recommendations are discussed in more detail below.  GENETIC TEST RESULTS: At the time of Ms. Balles's visit, we recommended she pursue genetic testing of the OvaNext gene panel. This test, which included sequencing and deletion/duplication analysis of the genes, was performed at [facility]. Genetic testing was normal, and did not reveal a deleterious mutation in these genes. A complete list of all genes tested is located on the test report scanned into EPIC.    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 At this time, it is unknown if this variant is associated with an increased risk for cancer or if this is a normal finding. With time, we suspect the lab will reclassify this variant and when they do, we will try to re-contact Ms. Weigelt to discuss the reclassification further.   We discussed with Ms. Pack that since the current genetic testing is not perfect, it is possible there may be a gene mutation in one of these genes that current testing cannot detect, but that chance is small.  We also discussed, that it is possible that another gene that has not yet been discovered, or that we have not yet tested, is responsible for the cancer diagnoses in the family, and it is, therefore, important to remain in touch with cancer genetics in the future so that we can continue to offer Ms. Ledin the most up to date genetic testing.   CANCER SCREENING  RECOMMENDATIONS: This normal result is reassuring and indicates that Ms. Aman does not likely have an increased risk of cancer due to a a mutation in one of these genes.  We, therefore, recommended  Ms. Moline continue to follow the cancer screening guidelines provided by her primary healthcare providers.   RECOMMENDATIONS FOR FAMILY MEMBERS:  While these results are reassuring for Ms. Rua, this test does not tell us anything about Ms. Mckimmy's relatives' risks. We recommended these relatives also have genetic counseling and testing. Please let us know if we can help facilitate testing. Genetic counselors can be located in other cities, by visiting the website of the [employer] ([employer]ist[employer].se) and Field seismologist for a Dietitian by zip code.  FOLLOW-UP: Lastly, we discussed with Ms. Popson that cancer genetics is a rapidly advancing field and it is possible that new genetic tests will be appropriate for her and/or her family members in the future. We encouraged her to remain in contact with cancer genetics on an annual basis so we can update her personal and family histories and let her know of advances in cancer genetics that may benefit this family.   Our contact number was provided. Ms. Haydon questions were answered to her satisfaction, and she knows she is welcome to call us at anytime with additional questions or concerns.   Catherine A. Fine, MS, CGC Certified Genetic Counseor [email or web address]

## 2014-03-28 ENCOUNTER — Telehealth: Payer: Self-pay | Admitting: Family Medicine

## 2014-03-28 DIAGNOSIS — I10 Essential (primary) hypertension: Secondary | ICD-10-CM

## 2014-03-28 DIAGNOSIS — E663 Overweight: Secondary | ICD-10-CM

## 2014-03-28 DIAGNOSIS — G4730 Sleep apnea, unspecified: Secondary | ICD-10-CM

## 2014-03-28 NOTE — Telephone Encounter (Signed)
Pt would like a referral to  Nutrition for weight loss. Pt stated dr todd said if she lose weight she can get off cpap machine.

## 2014-03-29 NOTE — Telephone Encounter (Signed)
Referral placed.

## 2014-04-02 ENCOUNTER — Encounter: Payer: Self-pay | Admitting: Skilled Nursing Facility1

## 2014-04-02 ENCOUNTER — Encounter: Payer: BC Managed Care – PPO | Attending: Family Medicine | Admitting: Skilled Nursing Facility1

## 2014-04-02 VITALS — Ht 64.0 in | Wt 229.0 lb

## 2014-04-02 DIAGNOSIS — E663 Overweight: Secondary | ICD-10-CM | POA: Insufficient documentation

## 2014-04-02 DIAGNOSIS — Z713 Dietary counseling and surveillance: Secondary | ICD-10-CM | POA: Insufficient documentation

## 2014-04-02 NOTE — Patient Instructions (Addendum)
-  Try to add in another day of exercise  -Instead of the protein shake eat other foods-Toast, fruit, cheese, etc. -Try to stick to serving sizes -Three meals a day and two snacks -Watch your sauces/dips/gravies because they are high in calories -Eat at the table and try to slow down when eating -Wait 20-30 minutes for a craving to pass

## 2014-04-02 NOTE — Progress Notes (Signed)
  Medical Nutrition Therapy:  Appt start time: 0930 end time:  1030.   Assessment:  Primary concerns today: referred for overweight. Patient wants to lose weight. Patient has felt tired all the time and thought she had sleep apnea so she went to the doctor who diagnosed her with sleep apnea. She feels better since getting her sleep machine a couple of months ago. She wears her fit bit and excersies and does not understand why shes not having more results and is tired of carring the extra weight. Sometimes she does not wear her sleeping machine and may pull it off in the middle of the night. 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 Patient reports 150lbs was her usual weight in late fourties early fifties until past ten years where she is up to 228lbs. She cooks and buys the groceries. She reports feeling a 4 oclock drop in her energy level. She Lives with her husband whom she feels is supportive. She states her goal is to get off the sleep machine and states her doctor says she can get off of it if she gets below 150lbs. She states she is a fast eater and eats in front of the television.   Preferred Learning Style:   Auditory  Learning Readiness:   Change in progress   MEDICATIONS: See List; hydrochlorothiazide   DIETARY INTAKE:  Usual eating pattern includes 3 meals and 1 snacks per day.  Everyday foods include none.  Avoided foods include starches.    24-hr recall:  B ( AM): 2 scrambled eggs, protein drink-------oatmeal Snk ( AM): none L ( PM): leaftovers from dinner-----sandwhich----wings-----salad Snk ( PM): none D ( PM): meat and veggies in her crock pot hook up Snk ( PM): popcorn----glass of wine Beverages: water, half and half tea, coffee with cream and sugar (truvia) or sugar  Usual physical activity: treadmill/oliptical, 3 times a week for 30 or 40 minutes    Estimated energy needs: 1600 calories 180 g carbohydrates 120 g  protein 44 g fat  Progress Towards Goal(s):  In progress.   Nutritional Diagnosis:  NI-1.5 Excessive energy intake As related to consumption of whey protein shakes and large portion sizes.  As evidenced by patient reporting an intake of protein shakes and large portion sizes.    Intervention:  Nutrition counseling. Dietitian praised the patient on her level of exercise and explained the weekly recommendation of moderate exercise to work up to. Portion sizes and consuming enough carbohydrates throughout the day was also discussed.  Goals:   -Try to add in another day of exercise  -Instead of the protein shake eat other foods-Toast, fruit, cheese, etc. -Try to stick to serving sizes -Three meals a day and two snacks -Watch your sauces/dips/gravies because they are high in calories -Eat at the table and try to slow down when eating -Wait 20-30 minutes for a craving to pass  Teaching Method Utilized:  Visual Auditory  Handouts given during visit include:  15 gram carbohydrate snack sheet  Yellow card for portion sizes  Low sodium seasoning options  Barriers to learning/adherence to lifestyle change: mindless eating and feeling like she has already made adequate changes  Demonstrated degree of understanding via:  Teach Back   Monitoring/Evaluation:  Dietary intake, exercise, proper portion sizes, and body weight in 3 week(s).

## 2014-05-03 ENCOUNTER — Encounter: Payer: BC Managed Care – PPO | Attending: Family Medicine | Admitting: *Deleted

## 2014-05-03 VITALS — Wt 225.0 lb

## 2014-05-03 DIAGNOSIS — E663 Overweight: Secondary | ICD-10-CM | POA: Insufficient documentation

## 2014-05-03 DIAGNOSIS — Z713 Dietary counseling and surveillance: Secondary | ICD-10-CM | POA: Diagnosis not present

## 2014-05-03 NOTE — Progress Notes (Signed)
  Medical Nutrition Therapy:  Appt start time: 0930 end time:  1030.   Assessment:  Primary concerns today: referred for overweight. Kylie Davis is here for follow up nutrition counseling.  She worked with [facility], [place] previously, but scheduling did not work out for follow up visits. She states she has been following MyPlate recommendations and eating more vegetables.  She states she also measures out her meat portions (3 oz)  She wears a FitBit and tries to get 10000 steps most days.  She is discouraged that she hasn't lost "enough weight" despite 4 pound weight loss in 1 month.  She also feels her new C-Pap machine is not working.  She continues to eat mindlessly in front of the tv and eats very quickly.      Preferred Learning Style:   Auditory  Learning Readiness:   Change in progress   MEDICATIONS: See List; hydrochlorothiazide   DIETARY INTAKE:  Usual eating pattern includes 3 meals and 1 snacks per day.  Everyday foods include none.  Avoided foods include starches.    24-hr recall:  B ( AM): 2 eggs and cheese toat Snk ( AM): apple L ( PM): tries to have salad more often Snk ( PM):sometimes walnuts or apple D ( PM): meat and 2 vegetables, sometimes starch Snk ( PM): glass of wine Beverages: coffee, water, unsweet tea  Usual physical activity: walks ~10000 step/day  Estimated energy needs: 1600 calories 180 g carbohydrates 120 g protein 44 g fat  Progress Towards Goal(s):  Some progress.   Nutritional Diagnosis:  NI-1.5 Excessive energy intake As related to consumption of whey protein shakes and large portion sizes.  As evidenced by patient reporting an intake of protein shakes and large portion sizes.    Intervention:  Nutrition counseling provided.  Praised Radio broadcast assistant for positive lifestyle changes she's made and encouraged her to be optimistic.  Suggested options for breakfasts and lunches.  Recommended following up about her poor sleep and discussed mindful eating:  eating without distractions and eating at a slower pace, honoring fullness cues and stopping before stuffed  Teaching Method Utilized:  Auditory   Barriers to learning/adherence to lifestyle change: mindless eating and feeling like she has already made adequate changes  Demonstrated degree of understanding via:  Teach Back   Monitoring/Evaluation:  Dietary intake, exercise, proper portion sizes, and body weight prn.

## 2014-05-03 NOTE — Patient Instructions (Signed)
Breakfast idea: Aim for protein and fiber-  Whole wheat toast and egg Oatmeal and nuts with milk Whole wheat cheese toast Yogurt parfait with fruit, granola, nuts (try greek style, but if you dont' like it, don't eat it) Smoothie with frozen fruit, yogurt, protein powder or peanut butter Wheat toast or English muffin with peanut butter omeltte  Lunch: Salad with protein Sandwich with veggies and fruit on the side Soups with vegetables hummus and vegetables and wheat crackers Leftovers from dinner the night before Tuna salad or chicken salad Burrito   Bring back the milk!!! Follow up with C-Pap Aim to eat without distractions and eat more slowly

## 2014-05-07 ENCOUNTER — Ambulatory Visit: Payer: BC Managed Care – PPO | Admitting: Dietician

## 2014-05-22 ENCOUNTER — Encounter: Payer: Self-pay | Admitting: Gynecology

## 2014-05-22 ENCOUNTER — Ambulatory Visit (INDEPENDENT_AMBULATORY_CARE_PROVIDER_SITE_OTHER): Payer: BC Managed Care – PPO | Admitting: Gynecology

## 2014-05-22 VITALS — BP 124/80 | Ht 64.0 in | Wt 229.0 lb

## 2014-05-22 DIAGNOSIS — Z01419 Encounter for gynecological examination (general) (routine) without abnormal findings: Secondary | ICD-10-CM

## 2014-05-22 NOTE — Patient Instructions (Signed)
You may obtain a copy of any labs that were done today by logging onto MyChart as outlined in the instructions provided with your AVS (after visit summary). The office will not call with normal lab results but certainly if there are any significant abnormalities then we will contact you.   Health Maintenance, Female A healthy lifestyle and preventative care can promote health and wellness.  Maintain regular health, dental, and eye exams.  Eat a healthy diet. Foods like vegetables, fruits, whole grains, low-fat dairy products, and lean protein foods contain the nutrients you need without too many calories. Decrease your intake of foods high in solid fats, added sugars, and salt. Get information about a proper diet from your caregiver, if necessary.  Regular physical exercise is one of the most important things you can do for your health. Most adults should get at least 150 minutes of moderate-intensity exercise (any activity that increases your heart rate and causes you to sweat) each week. In addition, most adults need muscle-strengthening exercises on 2 or more days a week.   Maintain a healthy weight. The body mass index (BMI) is a screening tool to identify possible weight problems. It provides an estimate of body fat based on height and weight. Your caregiver can help determine your BMI, and can help you achieve or maintain a healthy weight. For adults 20 years and older:  A BMI below 18.5 is considered underweight.  A BMI of 18.5 to 24.9 is normal.  A BMI of 25 to 29.9 is considered overweight.  A BMI of 30 and above is considered obese.  Maintain normal blood lipids and cholesterol by exercising and minimizing your intake of saturated fat. Eat a balanced diet with plenty of fruits and vegetables. Blood tests for lipids and cholesterol should begin at age 61 and be repeated every 5 years. If your lipid or cholesterol levels are high, you are over 50, or you are a high risk for heart  disease, you may need your cholesterol levels checked more frequently.Ongoing high lipid and cholesterol levels should be treated with medicines if diet and exercise are not effective.  If you smoke, find out from your caregiver how to quit. If you do not use tobacco, do not start.  Lung cancer screening is recommended for adults aged 33 80 years who are at high risk for developing lung cancer because of a history of smoking. Yearly low-dose computed tomography (CT) is recommended for people who have at least a 30-pack-year history of smoking and are a current smoker or have quit within the past 15 years. A pack year of smoking is smoking an average of 1 pack of cigarettes a day for 1 year (for example: 1 pack a day for 30 years or 2 packs a day for 15 years). Yearly screening should continue until the smoker has stopped smoking for at least 15 years. Yearly screening should also be stopped for people who develop a health problem that would prevent them from having lung cancer treatment.  If you are pregnant, do not drink alcohol. If you are breastfeeding, be very cautious about drinking alcohol. If you are not pregnant and choose to drink alcohol, do not exceed 1 drink per day. One drink is considered to be 12 ounces (355 mL) of beer, 5 ounces (148 mL) of wine, or 1.5 ounces (44 mL) of liquor.  Avoid use of street drugs. Do not share needles with anyone. Ask for help if you need support or instructions about stopping  the use of drugs.  High blood pressure causes heart disease and increases the risk of stroke. Blood pressure should be checked at least every 1 to 2 years. Ongoing high blood pressure should be treated with medicines, if weight loss and exercise are not effective.  If you are 59 to 64 years old, ask your caregiver if you should take aspirin to prevent strokes.  Diabetes screening involves taking a blood sample to check your fasting blood sugar level. This should be done once every 3  years, after age 91, if you are within normal weight and without risk factors for diabetes. Testing should be considered at a younger age or be carried out more frequently if you are overweight and have at least 1 risk factor for diabetes.  Breast cancer screening is essential preventative care for women. You should practice "breast self-awareness." This means understanding the normal appearance and feel of your breasts and may include breast self-examination. Any changes detected, no matter how small, should be reported to a caregiver. Women in their 66s and 30s should have a clinical breast exam (CBE) by a caregiver as part of a regular health exam every 1 to 3 years. After age 101, women should have a CBE every year. Starting at age 100, women should consider having a mammogram (breast X-ray) every year. Women who have a family history of breast cancer should talk to their caregiver about genetic screening. Women at a high risk of breast cancer should talk to their caregiver about having an MRI and a mammogram every year.  Breast cancer gene (BRCA)-related cancer risk assessment is recommended for women who have family members with BRCA-related cancers. BRCA-related cancers include breast, ovarian, tubal, and peritoneal cancers. Having family members with these cancers may be associated with an increased risk for harmful changes (mutations) in the breast cancer genes BRCA1 and BRCA2. Results of the assessment will determine the need for genetic counseling and BRCA1 and BRCA2 testing.  The Pap test is a screening test for cervical cancer. Women should have a Pap test starting at age 57. Between ages 25 and 35, Pap tests should be repeated every 2 years. Beginning at age 37, you should have a Pap test every 3 years as long as the past 3 Pap tests have been normal. If you had a hysterectomy for a problem that was not cancer or a condition that could lead to cancer, then you no longer need Pap tests. If you are  between ages 50 and 76, and you have had normal Pap tests going back 10 years, you no longer need Pap tests. If you have had past treatment for cervical cancer or a condition that could lead to cancer, you need Pap tests and screening for cancer for at least 20 years after your treatment. If Pap tests have been discontinued, risk factors (such as a new sexual partner) need to be reassessed to determine if screening should be resumed. Some women have medical problems that increase the chance of getting cervical cancer. In these cases, your caregiver may recommend more frequent screening and Pap tests.  The human papillomavirus (HPV) test is an additional test that may be used for cervical cancer screening. The HPV test looks for the virus that can cause the cell changes on the cervix. The cells collected during the Pap test can be tested for HPV. The HPV test could be used to screen women aged 44 years and older, and should be used in women of any age  who have unclear Pap test results. After the age of 55, women should have HPV testing at the same frequency as a Pap test.  Colorectal cancer can be detected and often prevented. Most routine colorectal cancer screening begins at the age of 44 and continues through age 20. However, your caregiver may recommend screening at an earlier age if you have risk factors for colon cancer. On a yearly basis, your caregiver may provide home test kits to check for hidden blood in the stool. Use of a small camera at the end of a tube, to directly examine the colon (sigmoidoscopy or colonoscopy), can detect the earliest forms of colorectal cancer. Talk to your caregiver about this at age 86, when routine screening begins. Direct examination of the colon should be repeated every 5 to 10 years through age 13, unless early forms of pre-cancerous polyps or small growths are found.  Hepatitis C blood testing is recommended for all people born from [birth year] through 1966 and any  individual with known risks for hepatitis C.  Practice safe sex. Use condoms and avoid high-risk sexual practices to reduce the spread of sexually transmitted infections (STIs). Sexually active women aged 36 and younger should be checked for Chlamydia, which is a common sexually transmitted infection. Older women with new or multiple partners should also be tested for Chlamydia. Testing for other STIs is recommended if you are sexually active and at increased risk.  Osteoporosis is a disease in which the bones lose minerals and strength with aging. This can result in serious bone fractures. The risk of osteoporosis can be identified using a bone density scan. Women ages 20 and over and women at risk for fractures or osteoporosis should discuss screening with their caregivers. Ask your caregiver whether you should be taking a calcium supplement or vitamin D to reduce the rate of osteoporosis.  Menopause can be associated with physical symptoms and risks. Hormone replacement therapy is available to decrease symptoms and risks. You should talk to your caregiver about whether hormone replacement therapy is right for you.  Use sunscreen. Apply sunscreen liberally and repeatedly throughout the day. You should seek shade when your shadow is shorter than you. Protect yourself by wearing long sleeves, pants, a wide-brimmed hat, and sunglasses year round, whenever you are outdoors.  Notify your caregiver of new moles or changes in moles, especially if there is a change in shape or color. Also notify your caregiver if a mole is larger than the size of a pencil eraser.  Stay current with your immunizations. Document Released: 11/17/2010 Document Revised: 08/29/2012 Document Reviewed: 11/17/2010 [facility] Patient Information 2014 [employer].

## 2014-05-22 NOTE — Progress Notes (Signed)
Kylie Davis March 31, 1951 [id number]        64 y.o.  G0P0 for annual exam.  Several issues noted below.  Past medical history,surgical history, problem list, medications, allergies, family history and social history were all reviewed and documented as reviewed in the EPIC chart.  ROS:  Performed with pertinent positives and negatives included in the history, assessment and plan.   Additional significant findings :  none   Exam: Kylie Davis Vitals:   05/22/14 1000  BP: 124/80  Height: 5\' 4"  (1.626 m)  Weight: 229 lb (103.874 kg)   General appearance:  Normal affect, orientation and appearance. Skin: Grossly normal HEENT: Without gross lesions.  No cervical or supraclavicular adenopathy. Thyroid normal.  Lungs:  Clear without wheezing, rales or rhonchi Cardiac: RR, without RMG Abdominal:  Soft, nontender, without masses, guarding, rebound, organomegaly or hernia Breasts:  Examined lying and sitting without masses, retractions, discharge or axillary adenopathy. Pelvic:  Ext/BUS/vagina with mild atrophic changes  Cervix normal  Uterus axial to anteverted, normal size, shape and contour, midline and mobile nontender   Adnexa  Without masses or tenderness    Anus and perineum  Normal   Rectovaginal  Normal sphincter tone without palpated masses or tenderness.    Assessment/Plan:  64 y.o. [name] for annual exam.   1. Postmenopausal/atrophic genital changes. Without significant symptoms of hot flushes, night sweats, vaginal dryness or dyspareunia. No vaginal bleeding. Continue to monitor. Report any vaginal bleeding. 2. Pap smear 04/2012. No Pap smear done today.  History of cryosurgery in her 23s with normal Pap smears since then. Plan repeat Pap smear next year at 3 year interval per current screening guidelines. 3. Mammography 10/2013. Continue with annual mammography. SBE monthly reviewed. 4. DEXA 2012 normal. Repeat at 5 year interval. Increased calcium vitamin D  reviewed. 5. Colonoscopy 2014. Repeat at their recommended interval. 6. Health maintenance. No routine blood work done as she reports this done at her primary physician's office. Follow up in one year, sooner as needed.     Kylie Auerbach MD, 10:33 AM 05/22/2014

## 2014-05-23 LAB — URINALYSIS W MICROSCOPIC + REFLEX CULTURE
Bacteria, UA: NONE SEEN
Bilirubin Urine: NEGATIVE
CRYSTALS: NONE SEEN
Casts: NONE SEEN
GLUCOSE, UA: NEGATIVE mg/dL
HGB URINE DIPSTICK: NEGATIVE
KETONES UR: NEGATIVE mg/dL
Leukocytes, UA: NEGATIVE
NITRITE: NEGATIVE
PROTEIN: NEGATIVE mg/dL
Specific Gravity, Urine: 1.03 (ref 1.005–1.030)
Urobilinogen, UA: 0.2 mg/dL (ref 0.0–1.0)
pH: 6 (ref 5.0–8.0)

## 2014-05-25 ENCOUNTER — Telehealth: Payer: Self-pay | Admitting: Pulmonary Disease

## 2014-05-25 NOTE — Telephone Encounter (Signed)
LMTCB

## 2014-05-25 NOTE — Telephone Encounter (Signed)
Pt returned our call-she is aware to get in touch with [facility]; if they need an order for new mask then they will let us know. Otherwise Jonni Sanger at [facility] can help patient get a better fitting and useable mask. Nothing more needed at this time.

## 2014-06-15 ENCOUNTER — Encounter: Payer: BC Managed Care – PPO | Attending: Family Medicine | Admitting: *Deleted

## 2014-06-15 DIAGNOSIS — E663 Overweight: Secondary | ICD-10-CM | POA: Insufficient documentation

## 2014-06-15 DIAGNOSIS — Z713 Dietary counseling and surveillance: Secondary | ICD-10-CM | POA: Insufficient documentation

## 2014-06-15 NOTE — Patient Instructions (Addendum)
Aim to get back sitting at the table Set timer for 20 minutes to eat meals.  If you finish early.  Still sit there and marinate Try exercise classes for variety Aim to have starch, protein, fruit or vegetable each meal

## 2014-06-15 NOTE — Progress Notes (Signed)
  Medical Nutrition Therapy:  Appt start time: 1030 end time:  1100.   Assessment:  Primary concerns today: referred for overweight. Kylie Davis is here for follow up nutrition counseling.  She is frustrated because he wants to lose weight more quickly than she is.  She is exercising and trying to make food modifications.  She still eats very quickly  She got a new c-pap machine and things have improved with her sleep.  She was eating at the table, but then her husband varnished it so she was Forced to eat at the tv.  They should be back at the table soon.   Preferred Learning Style:   Auditory  Learning Readiness:   Change in progress   MEDICATIONS: See List; hydrochlorothiazide   DIETARY INTAKE:  Usual eating pattern includes 3 meals and 1 snacks per day.  Everyday foods include none.  Avoided foods include starches.    24-hr recall:  B: 2 scrambled eggs and slice toast L: leftovers (black beans and rice with ground beef) S: banana D: salad with chicken Beverages: 2 glasses wine  Usual physical activity: works out at gym (treadmill, elliptical) weight lifting.  Goes 4 days for 30-35 minutes.  Does the weights 3 days 15-20 minute Worked out with personal trainer once to get some ideas.  For 6 months  Estimated energy needs: 1600 calories 180 g carbohydrates 120 g protein 44 g fat  Progress Towards Goal(s):  Some progress.   Nutritional Diagnosis:  NI-1.5 Excessive energy intake As related to consumption of whey protein shakes and large portion sizes.  As evidenced by patient reporting an intake of protein shakes and large portion sizes.    Intervention:  Nutrition counseling provided.  Praised Radio broadcast assistant for positive lifestyle changes she's made and encouraged her to be optimistic.  Suggested trying new exercises since she seems to have reached a plateau.  Reviewed MyPlate recommendations for meal planning and mindful eating: sit at table without distraction; aim to eat more  slowly  Teaching Method Utilized:  Auditory   Barriers to learning/adherence to lifestyle change: mindless eating and feeling like she has already made adequate changes  Demonstrated degree of understanding via:  Teach Back   Monitoring/Evaluation:  Dietary intake, exercise, proper portion sizes, and body weight in a few week(s).

## 2014-07-03 ENCOUNTER — Encounter: Payer: BC Managed Care – PPO | Attending: Family Medicine | Admitting: *Deleted

## 2014-07-03 VITALS — Wt 222.0 lb

## 2014-07-03 DIAGNOSIS — Z713 Dietary counseling and surveillance: Secondary | ICD-10-CM | POA: Insufficient documentation

## 2014-07-03 DIAGNOSIS — E663 Overweight: Secondary | ICD-10-CM | POA: Insufficient documentation

## 2014-07-03 NOTE — Progress Notes (Signed)
  Medical Nutrition Therapy:  Appt start time: 1430 end time:  1500.   Assessment:  Primary concerns today: referred for overweight. Kylie Davis is here for follow up nutrition counseling.  She is recoverying from the stomach flue this past weekend and she didn't eat much and was having diarrhea and vomiting.  She still doesn't have her appetite back.   She still has trouble with eating in front of the tv.  She is trying to eat more slowly.  She is still struggling with lunch options and sometimes delays lunch until really late.  Her husband won't eat at the table with her and she still eats fairy quickly.  She did look into the Zumba class and she wants to take that class on Thursday  Preferred Learning Style:   Auditory  Learning Readiness:   Change in progress   MEDICATIONS: See List; hydrochlorothiazide   DIETARY INTAKE:  Usual eating pattern includes 3 meals and 1 snacks per day.  Everyday foods include none.  Avoided foods include starches.    24-hr recall:  B: cheese toast and 2 egg L: usually running around or at school Beverages: 2 glasses wine  Usual physical activity: works out at gym (treadmill, elliptical) weight lifting.  Goes 4 days for 30-35 minutes.  Does the weights 3 days 15-20 minute Worked out with personal trainer once to get some ideas.  For 6 months  Estimated energy needs: 1600 calories 180 g carbohydrates 120 g protein 44 g fat  Progress Towards Goal(s):  Some progress.   Nutritional Diagnosis:  NI-1.5 Excessive energy intake As related to consumption of whey protein shakes and large portion sizes.  As evidenced by patient reporting an intake of protein shakes and large portion sizes.    Intervention:  Nutrition counseling provided.  Praised Radio broadcast assistant for positive lifestyle changes she's made and encouraged her to be optimistic.  Discussed planning ahead with meals and snacks, especially when she's at work.  Reiterated mindful eating  recommendations.  Goals: Continue to try and slow down and eat without distraction Try Zumba class Plan ahead with meal and snacks- pack lunch the night before, pack snack  Teaching Method Utilized:  Auditory   Barriers to learning/adherence to lifestyle change: mindless eating and feeling like she has already made adequate changes  Demonstrated degree of understanding via:  Teach Back   Monitoring/Evaluation:  Dietary intake, exercise, proper portion sizes, and body weight in a few week(s).

## 2014-07-03 NOTE — Patient Instructions (Signed)
Continue to try and slow down and eat without distraction Try Zumba class Plan ahead with meal and snacks- pack lunch the night before, pack snack Ask if you can eat during lunch meeting.  Make meals a priorty- put oxygen mask on yourself first

## 2014-07-25 ENCOUNTER — Encounter: Payer: Self-pay | Admitting: Pulmonary Disease

## 2014-07-25 ENCOUNTER — Ambulatory Visit (INDEPENDENT_AMBULATORY_CARE_PROVIDER_SITE_OTHER): Payer: BC Managed Care – PPO | Admitting: Pulmonary Disease

## 2014-07-25 VITALS — BP 126/72 | HR 77 | Temp 97.9°F | Ht 64.0 in | Wt 228.2 lb

## 2014-07-25 DIAGNOSIS — G4733 Obstructive sleep apnea (adult) (pediatric): Secondary | ICD-10-CM

## 2014-07-25 NOTE — Patient Instructions (Signed)
Stay on cpap, and keep up with mask changes and supplies. Work on weight loss followup with me again in one year 

## 2014-07-25 NOTE — Assessment & Plan Note (Signed)
The patient feels that she is doing very well on her current C Pap set up, and her downloaded shows adequate compliance with good control of her AHI.  I have asked her to continue on her device, and to follow-up with me again in one year if doing well. I've also encouraged her to work aggressively on weight loss, and to keep up with her mask changes and supplies.

## 2014-07-25 NOTE — Progress Notes (Signed)
   Subjective:    Patient ID: Kylie Davis, female    DOB: 03/23/1951, 64 y.o.   MRN: [id number]  HPI The patient comes in today for follow-up of her obstructive sleep apnea. She is wearing C Pap compliantly by her download, and has excellent control of her AHI and no significant mask leak. That she is sleeping well with the device, and has seen improvement in her daytime alertness. She is having no issues with her pressure or mask fit.   Review of Systems  Constitutional: Negative for fever and unexpected weight change.  HENT: Negative for congestion, dental problem, ear pain, nosebleeds, postnasal drip, rhinorrhea, sinus pressure, sneezing, sore throat and trouble swallowing.   Eyes: Negative for redness and itching.  Respiratory: Negative for cough, chest tightness, shortness of breath and wheezing.   Cardiovascular: Negative for palpitations and leg swelling.  Gastrointestinal: Negative for nausea and vomiting.  Genitourinary: Negative for dysuria.  Musculoskeletal: Negative for joint swelling.  Skin: Negative for rash.  Neurological: Negative for headaches.  Hematological: Does not bruise/bleed easily.  Psychiatric/Behavioral: Negative for dysphoric mood. The patient is not nervous/anxious.        Objective:   Physical Exam Obese female in no acute distress Nose without purulence or discharge noted No skin breakdown or pressure necrosis from the C mask Neck without lymphadenopathy or thyromegaly Lower extremities with minimal edema, no cyanosis Alert and oriented, moves all 4 extremities.       Assessment & Plan:

## 2014-07-26 ENCOUNTER — Ambulatory Visit: Payer: BC Managed Care – PPO | Admitting: *Deleted

## 2014-10-22 ENCOUNTER — Telehealth: Payer: Self-pay | Admitting: Pulmonary Disease

## 2014-10-22 NOTE — Telephone Encounter (Signed)
I spoke with pt. She reports she had dental implants and have not been able to wear CPAP at this time. She just wanted to let us know.

## 2014-10-22 NOTE — Telephone Encounter (Signed)
That is understandable.  Just get back on cpap once she is able.

## 2014-11-12 ENCOUNTER — Other Ambulatory Visit: Payer: Self-pay

## 2014-11-26 ENCOUNTER — Other Ambulatory Visit: Payer: Self-pay | Admitting: Family Medicine

## 2014-12-03 ENCOUNTER — Other Ambulatory Visit: Payer: Self-pay

## 2014-12-03 DIAGNOSIS — Z1231 Encounter for screening mammogram for malignant neoplasm of breast: Secondary | ICD-10-CM

## 2014-12-05 ENCOUNTER — Ambulatory Visit
Admission: RE | Admit: 2014-12-05 | Discharge: 2014-12-05 | Disposition: A | Discharge status: Home / Self Care | Payer: Medicare Other | Source: Ambulatory Visit

## 2014-12-05 DIAGNOSIS — Z1231 Encounter for screening mammogram for malignant neoplasm of breast: Secondary | ICD-10-CM

## 2015-01-11 ENCOUNTER — Telehealth: Payer: Self-pay | Admitting: Family Medicine

## 2015-01-11 NOTE — Telephone Encounter (Signed)
Please see message and adivse.

## 2015-01-11 NOTE — Telephone Encounter (Signed)
Pt would like to transfer to [facility] will this be ok .Marland Kitchen

## 2015-01-14 NOTE — Telephone Encounter (Signed)
Fine with me

## 2015-01-15 NOTE — Telephone Encounter (Signed)
Pt has been sch

## 2015-01-15 NOTE — Telephone Encounter (Signed)
lmovm for pt to call back and set up an establishment visit

## 2015-01-23 ENCOUNTER — Other Ambulatory Visit: Payer: Self-pay | Admitting: Family Medicine

## 2015-02-18 ENCOUNTER — Encounter: Payer: Self-pay | Admitting: Adult Health

## 2015-02-18 ENCOUNTER — Ambulatory Visit (INDEPENDENT_AMBULATORY_CARE_PROVIDER_SITE_OTHER): Payer: Medicare Other | Admitting: Adult Health

## 2015-02-18 VITALS — BP 130/80 | Temp 99.0°F | Ht 64.0 in | Wt 223.2 lb

## 2015-02-18 DIAGNOSIS — H6123 Impacted cerumen, bilateral: Secondary | ICD-10-CM | POA: Diagnosis not present

## 2015-02-18 DIAGNOSIS — Z0000 Encounter for general adult medical examination without abnormal findings: Secondary | ICD-10-CM | POA: Diagnosis not present

## 2015-02-18 NOTE — Progress Notes (Signed)
Subjective:    Patient ID: Kylie Davis, female    DOB: 1950-11-08, 64 y.o.   MRN: [id number]  HPI  64 year old female who presents to the office today for bilateral ear pain. She has had this ear pain for roughly 10 days. Denies any fever or discharge. She feels as though her ears are clogged.     Review of Systems  Constitutional: Negative.   HENT: Positive for ear pain. Negative for ear discharge, hearing loss, postnasal drip, rhinorrhea, sinus pressure, sneezing, sore throat and tinnitus.   Eyes: Negative.   Respiratory: Negative.   Cardiovascular: Negative.   Hematological: Negative.   All other systems reviewed and are negative.  Past Medical History  Diagnosis Date  . Hypertension   . Sleep apnea     C PAP  . Hypertension     Social History   Social History  . Marital Status: Married    Spouse Name: N/A  . Number of Children: N/A  . Years of Education: N/A   Occupational History  . retired Pharmacist, hospital    Social History Main Topics  . Smoking status: Never Smoker   . Smokeless tobacco: Never Used  . Alcohol Use: 2.4 oz/week    4 Standard drinks or equivalent per week     Comment: weekly  . Drug Use: No  . Sexual Activity: Yes    Birth Control/ Protection: Post-menopausal     Comment: 1st intercourse 64 yo.---Fewer than 5 partners   Other Topics Concern  . Not on file   Social History Narrative    Past Surgical History  Procedure Laterality Date  . Cholecystectomy  2004    Family History  Problem Relation Age of Onset  . Cancer Mother 8    primary peritoneal cancer  . Alzheimer's disease Mother   . Cancer Father 3    stomach or pancreatic  . Heart disease Father   . Hypertension Brother   . Heart attack Brother 24  . Diabetes Paternal Grandmother   . Colon cancer Neg Hx   . Breast cancer Paternal Aunt 73    No Known Allergies  Current Outpatient Prescriptions on File Prior to Visit  Medication Sig Dispense Refill  . aspirin 81 MG  tablet Take 81 mg by mouth daily.      . calcium-vitamin D (SM CALCIUM 500/VITAMIN D3) 500-400 MG-UNIT per tablet Take 1 tablet by mouth daily.     Marland Kitchen losartan-hydrochlorothiazide (HYZAAR) 50-12.5 MG per tablet TAKE ONE-HALF TABLET BY MOUTH EVERY MORNING 45 tablet 1  . multivitamin (THERAGRAN) per tablet Take 1 tablet by mouth daily.       No current facility-administered medications on file prior to visit.    There were no vitals taken for this visit.       Objective:   Physical Exam  Constitutional: She is oriented to person, place, and time. She appears well-developed and well-nourished. No distress.  HENT:  Head: Normocephalic and atraumatic.  Right Ear: External ear normal.  Left Ear: External ear normal.  Mouth/Throat: Oropharynx is clear and moist. No oropharyngeal exudate.  Moderate cerumen impaction in bilateral ears. Unable to visualized TM at this time.   Neck: No thyromegaly present.  Lymphadenopathy:    She has no cervical adenopathy.  Neurological: She is alert and oriented to person, place, and time.  Skin: Skin is warm and dry. No rash noted. She is not diaphoretic. No erythema. No pallor.  Psychiatric: She has a normal mood  and affect. Her behavior is normal. Judgment and thought content normal.  Nursing note and vitals reviewed.       Assessment & Plan:   1. Cerumen impaction, bilateral - Ears flushed and cerumen impaction was dislodged. TM's visualized, no signs of infection were seen.   2. Routine general medical examination at a health care facility - She would like to have her labs done prior to her PCE - Basic metabolic panel; Future - CBC with Differential/Platelet; Future - Hemoglobin A1c; Future - Hepatic function panel; Future - Lipid panel; Future - POCT urinalysis dipstick; Future - TSH; Future - Magnesium; Future

## 2015-02-18 NOTE — Patient Instructions (Addendum)
It was great meeting you today!  I hope you feel better after having your ears cleaned out. If you do not get relief, please let me know.   I will see you soon!  Cerumen Impaction A cerumen impaction is when the wax in your ear forms a plug. This plug usually causes reduced hearing. Sometimes it also causes an earache or dizziness. Removing a cerumen impaction can be difficult and painful. The wax sticks to the ear canal. The canal is sensitive and bleeds easily. If you try to remove a heavy wax buildup with a cotton tipped swab, you may push it in further. Irrigation with water, suction, and small ear curettes may be used to clear out the wax. If the impaction is fixed to the skin in the ear canal, ear drops may be needed for a few days to loosen the wax. People who build up a lot of wax frequently can use ear wax removal products available in your local drugstore. SEEK MEDICAL CARE IF:  You develop an earache, increased hearing loss, or marked dizziness. Document Released: 06/11/2004 Document Revised: 07/27/2011 Document Reviewed: 08/01/2009 [facility] Patient Information 2015 [place], [place]. This information is not intended to replace advice given to you by your health care provider. Make sure you discuss any questions you have with your health care provider.

## 2015-02-20 ENCOUNTER — Encounter: Payer: Self-pay | Admitting: Adult Health

## 2015-02-20 ENCOUNTER — Other Ambulatory Visit (INDEPENDENT_AMBULATORY_CARE_PROVIDER_SITE_OTHER): Payer: Medicare Other

## 2015-02-20 DIAGNOSIS — I10 Essential (primary) hypertension: Secondary | ICD-10-CM

## 2015-02-20 DIAGNOSIS — Z0000 Encounter for general adult medical examination without abnormal findings: Secondary | ICD-10-CM | POA: Diagnosis not present

## 2015-02-20 LAB — CBC WITH DIFFERENTIAL/PLATELET
Basophils Absolute: 0 10*3/uL (ref 0.0–0.1)
Basophils Relative: 0.6 % (ref 0.0–3.0)
EOS PCT: 4.2 % (ref 0.0–5.0)
Eosinophils Absolute: 0.3 10*3/uL (ref 0.0–0.7)
HCT: 40.5 % (ref 36.0–46.0)
Hemoglobin: 13.6 g/dL (ref 12.0–15.0)
LYMPHS ABS: 2.2 10*3/uL (ref 0.7–4.0)
Lymphocytes Relative: 35.8 % (ref 12.0–46.0)
MCHC: 33.5 g/dL (ref 30.0–36.0)
MCV: 88.5 fl (ref 78.0–100.0)
MONO ABS: 0.5 10*3/uL (ref 0.1–1.0)
Monocytes Relative: 7.9 % (ref 3.0–12.0)
NEUTROS ABS: 3.2 10*3/uL (ref 1.4–7.7)
NEUTROS PCT: 51.5 % (ref 43.0–77.0)
PLATELETS: 286 10*3/uL (ref 150.0–400.0)
RBC: 4.57 Mil/uL (ref 3.87–5.11)
RDW: 14.6 % (ref 11.5–15.5)
WBC: 6.2 10*3/uL (ref 4.0–10.5)

## 2015-02-20 LAB — BASIC METABOLIC PANEL
BUN: 14 mg/dL (ref 6–23)
CALCIUM: 9.3 mg/dL (ref 8.4–10.5)
CO2: 30 meq/L (ref 19–32)
Chloride: 99 mEq/L (ref 96–112)
Creatinine, Ser: 0.81 mg/dL (ref 0.40–1.20)
GFR: 75.32 mL/min (ref >60.00)
GLUCOSE: 98 mg/dL (ref 70–99)
POTASSIUM: 4 meq/L (ref 3.5–5.1)
Sodium: 137 mEq/L (ref 135–145)

## 2015-02-20 LAB — HEPATIC FUNCTION PANEL
ALBUMIN: 4.1 g/dL (ref 3.5–5.2)
ALT: 43 U/L — AB (ref 0–35)
AST: 30 U/L (ref 0–37)
Alkaline Phosphatase: 60 U/L (ref 39–117)
BILIRUBIN TOTAL: 0.5 mg/dL (ref 0.2–1.2)
Bilirubin, Direct: 0.1 mg/dL (ref 0.0–0.3)
Total Protein: 7.7 g/dL (ref 6.0–8.3)

## 2015-02-20 LAB — LIPID PANEL
CHOLESTEROL: 161 mg/dL (ref 0–200)
HDL: 37.4 mg/dL — ABNORMAL LOW (ref >39.00)
LDL Cholesterol: 103 mg/dL — ABNORMAL HIGH (ref 0–99)
NonHDL: 124.05
TRIGLYCERIDES: 103 mg/dL (ref 0.0–149.0)
Total CHOL/HDL Ratio: 4
VLDL: 20.6 mg/dL (ref 0.0–40.0)

## 2015-02-20 LAB — HEMOGLOBIN A1C: HEMOGLOBIN A1C: 5.9 % (ref 4.6–6.5)

## 2015-02-20 LAB — MAGNESIUM: MAGNESIUM: 2.1 mg/dL (ref 1.5–2.5)

## 2015-02-20 LAB — TSH: TSH: 4.8 u[IU]/mL — AB (ref 0.35–4.50)

## 2015-02-28 ENCOUNTER — Ambulatory Visit (INDEPENDENT_AMBULATORY_CARE_PROVIDER_SITE_OTHER): Payer: Medicare Other | Admitting: Adult Health

## 2015-02-28 ENCOUNTER — Encounter: Payer: Self-pay | Admitting: Adult Health

## 2015-02-28 VITALS — BP 140/80 | HR 80 | Temp 98.3°F | Wt 216.9 lb

## 2015-02-28 DIAGNOSIS — Z0000 Encounter for general adult medical examination without abnormal findings: Secondary | ICD-10-CM | POA: Diagnosis not present

## 2015-02-28 DIAGNOSIS — Z23 Encounter for immunization: Secondary | ICD-10-CM

## 2015-02-28 DIAGNOSIS — E663 Overweight: Secondary | ICD-10-CM | POA: Diagnosis not present

## 2015-02-28 DIAGNOSIS — I10 Essential (primary) hypertension: Secondary | ICD-10-CM

## 2015-02-28 NOTE — Addendum Note (Signed)
Addended by: Colleen Can on: 02/28/2015 02:26 PM   Modules accepted: Orders

## 2015-02-28 NOTE — Patient Instructions (Signed)
It was great seeing you again!  Continue to eat healthy and exercise. You are doing a great job with the weight loss.    Follow up with me in December to recheck thyroid and blood pressure   If you need anything in the meantime, please let me know

## 2015-02-28 NOTE — Progress Notes (Addendum)
HPI:  Kylie Davis is here to establish care and for her complete physical. She is a very pleasant 64 year old Caucasian female who  has a past medical history of Hypertension and Sleep apnea.  All immunizations and health maintenance protocols were reviewed with the patient and needed orders were placed.  Appropriate screening laboratory values were reviewed with  the patient including screening of hyperlipidemia, renal function and hepatic function.  Medication reconciliation,  past medical history, social history, problem list and allergies were reviewed in detail with the patient  Goals were established with regard to weight loss, exercise, and  diet in compliance with medications  End of life planning was discussed.- She does have advanced directives and living will.    Last PCP and physical:08/2013 Immunizations:UTD Diet: Eats healthy Exercise:She exercises 3 days a week.  Colonoscopy:2014- come back in 10 years  Dexa: 2012 Pap Smear: GYN Mammogram 11/2014 Dentist: Twice a year Eye: Yearly .  Has the following chronic problems that require follow up and concerns today:  Hypertension - She feels is controlled on her current medications. Has been monitoring it at home and has a constant reading of 130/80's. She denies any headache, blurred vision or dizziness.   Obesity - She is eating healthy and exercising. She has lost about 15 pounds in the last month.   Wt Readings from Last 3 Encounters:  02/28/15 216 lb 14.4 oz (98.385 kg)  02/18/15 223 lb 3.2 oz (101.243 kg)  07/25/14 228 lb 3.2 oz (103.511 kg)    ROS negative for unless reported above: fevers, chills,feeling poorly, unintentional weight loss, hearing or vision loss, chest pain, palpitations, leg claudication, struggling to breath,Not feeling congested in the chest, no orthopenia, no cough,no wheezing, normal appetite, no soft tissue swelling, no hemoptysis, melena, hematochezia, hematuria, falls, loc, si,  or thoughts of self harm.     Past Medical History  Diagnosis Date  . Hypertension   . Sleep apnea     C PAP  . Hypertension     Past Surgical History  Procedure Laterality Date  . Cholecystectomy  2004    Family History  Problem Relation Age of Onset  . Cancer Mother 30    primary peritoneal cancer  . Alzheimer's disease Mother   . Cancer Father 54    stomach or pancreatic  . Heart disease Father   . Hypertension Brother   . Heart attack Brother 63  . Diabetes Paternal Grandmother   . Colon cancer Neg Hx   . Breast cancer Paternal Aunt 29    Social History   Social History  . Marital Status: Married    Spouse Name: N/A  . Number of Children: N/A  . Years of Education: N/A   Occupational History  . retired Pharmacist, hospital    Social History Main Topics  . Smoking status: Never Smoker   . Smokeless tobacco: Never Used  . Alcohol Use: 2.4 oz/week    4 Standard drinks or equivalent per week     Comment: weekly  . Drug Use: No  . Sexual Activity: Yes    Birth Control/ Protection: Post-menopausal     Comment: 1st intercourse 64 yo.---Fewer than 5 partners   Other Topics Concern  . None   Social History Narrative     Current outpatient prescriptions:  .  aspirin 81 MG tablet, Take 81 mg by mouth daily.  , Disp: , Rfl:  .  calcium-vitamin D (SM CALCIUM 500/VITAMIN D3) 500-400 MG-UNIT per  tablet, Take 1 tablet by mouth daily. , Disp: , Rfl:  .  losartan-hydrochlorothiazide (HYZAAR) 50-12.5 MG per tablet, TAKE ONE-HALF TABLET BY MOUTH EVERY MORNING, Disp: 45 tablet, Rfl: 1 .  multivitamin (THERAGRAN) per tablet, Take 1 tablet by mouth daily.  , Disp: , Rfl:   EXAM:  Filed Vitals:   02/28/15 1309  BP: 140/80  Pulse: 80  Temp: 98.3 F (36.8 C)    Body mass index is 37.21 kg/(m^2).  GENERAL: vitals reviewed and listed above, alert, oriented, appears well hydrated and in no acute distress. Obese  HEENT: atraumatic, conjunttiva clear, no obvious  abnormalities on inspection of external nose and ears. TM's visualized. No signs of infection.   NECK: Neck is soft and supple without masses, no adenopathy or thyromegaly, trachea midline, no JVD. Normal range of motion.   LUNGS: clear to auscultation bilaterally, no wheezes, rales or rhonchi, good air movement  CV: Regular rate and rhythm, normal S1/S2, no audible murmurs, gallops, or rubs. No carotid bruit and trace peripheral edema.   MS: moves all extremities without noticeable abnormality. No edema noted  Abd: soft/nontender/nondistended/normal bowel sounds   Skin: warm and dry, no rash   Extremities: No clubbing, cyanosis, or edema. Capillary refill is WNL. Pulses intact bilaterally in upper and lower extremities.   Neuro: CN II-XII intact, sensation and reflexes normal throughout, 5/5 muscle strength in bilateral upper and lower extremities. Normal finger to nose. Normal rapid alternating movements. Normal romberg. No pronator drift.   PSYCH: pleasant and cooperative, no obvious depression or anxiety  ASSESSMENT AND PLAN: 1. Routine general medical examination at a health care facility - Continue to work on diet and exericise - Follow up in one year for CPE - Follow up sooner if needed - Reviewed labs. Will recheck TSH in two months.   2. Essential hypertension, benign - BP slightly elevated in office today. As she continues to lose weight her BP will hopefully come down. Will recheck in 2 months and if needed place on 25mg  HCTZ  3. Overweight - She is losing weight by exercising and eating healthy.   -We reviewed the PMH, PSH, FH, SH, Meds and Allergies. -We provided refills for any medications we will prescribe as needed. -We addressed current concerns per orders and patient instructions. -We have asked for records for pertinent exams, studies, vaccines and notes from previous providers. -We have advised patient to follow up per instructions below.   -Patient  advised to return or notify a provider immediately if symptoms worsen or persist or new concerns arise.    [name], AGNP

## 2015-02-28 NOTE — Progress Notes (Signed)
Pre visit review using our clinic review tool, if applicable. No additional management support is needed unless otherwise documented below in the visit note. 

## 2015-05-06 ENCOUNTER — Ambulatory Visit (INDEPENDENT_AMBULATORY_CARE_PROVIDER_SITE_OTHER): Payer: Medicare Other | Admitting: Adult Health

## 2015-05-06 ENCOUNTER — Encounter: Payer: Self-pay | Admitting: Adult Health

## 2015-05-06 VITALS — BP 134/80 | Temp 98.4°F | Ht 64.0 in | Wt 206.5 lb

## 2015-05-06 DIAGNOSIS — I10 Essential (primary) hypertension: Secondary | ICD-10-CM

## 2015-05-06 DIAGNOSIS — R7989 Other specified abnormal findings of blood chemistry: Secondary | ICD-10-CM

## 2015-05-06 LAB — BASIC METABOLIC PANEL
BUN: 18 mg/dL (ref 6–23)
CALCIUM: 9.6 mg/dL (ref 8.4–10.5)
CO2: 31 meq/L (ref 19–32)
CREATININE: 0.73 mg/dL (ref 0.40–1.20)
Chloride: 100 mEq/L (ref 96–112)
GFR: 84.87 mL/min (ref >60.00)
Glucose, Bld: 92 mg/dL (ref 70–99)
Potassium: 4.8 mEq/L (ref 3.5–5.1)
SODIUM: 138 meq/L (ref 135–145)

## 2015-05-06 LAB — TSH: TSH: 3.75 u[IU]/mL (ref 0.35–4.50)

## 2015-05-06 MED ORDER — LOSARTAN POTASSIUM-HCTZ 50-12.5 MG PO TABS: 1.0000 | ORAL_TABLET | Freq: Every morning | ORAL | Status: DC

## 2015-05-06 NOTE — Progress Notes (Signed)
Subjective:    Patient ID: Kylie Davis, female    DOB: April 29, 1951, 64 y.o.   MRN: [id number]  HPI  64 year old female who presents to the office today for follow up regarding hypertension and subclinical hypothyroidism. She is currently taking Hyzaar 12-50mg . She has lost over 20 pounds since October and is at 206. Her blood pressure in the office is BP: 134/80 mmHg  . She denies any headaches or blurred vision. She states " I actually feel a lot better since I started eating right, exercising and losing weight."   During her annual physical labs, her TSH was slightly elevated. She denies any signs or symptoms of hypothyroidism.    Review of Systems  Constitutional: Negative.   HENT: Negative.   Respiratory: Negative.   Cardiovascular: Negative.   Neurological: Negative.   All other systems reviewed and are negative.  Past Medical History  Diagnosis Date  . Hypertension   . Sleep apnea     C PAP    Social History   Social History  . Marital Status: Married    Spouse Name: N/A  . Number of Children: N/A  . Years of Education: N/A   Occupational History  . retired Pharmacist, hospital    Social History Main Topics  . Smoking status: Never Smoker   . Smokeless tobacco: Never Used  . Alcohol Use: 2.4 oz/week    4 Standard drinks or equivalent per week     Comment: weekly  . Drug Use: No  . Sexual Activity: Yes    Birth Control/ Protection: Post-menopausal     Comment: 1st intercourse 64 yo.---Fewer than 5 partners   Other Topics Concern  . Not on file   Social History Narrative   Retired from being a Education officer, museum.    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 years    0 children        Past Surgical History  Procedure Laterality Date  . Cholecystectomy  2004    Family History  Problem Relation Age of Onset  . Cancer Mother 48    primary peritoneal cancer  . Alzheimer's disease Mother   . Cancer Father 58    stomach or pancreatic  . Heart disease Father   . Hypertension Brother   .  Heart attack Brother 67    MI x 2  . Diabetes Paternal Grandmother   . Colon cancer Neg Hx   . Breast cancer Paternal Aunt 61    No Known Allergies  Current Outpatient Prescriptions on File Prior to Visit  Medication Sig Dispense Refill  . aspirin 81 MG tablet Take 81 mg by mouth daily.      . calcium-vitamin D (SM CALCIUM 500/VITAMIN D3) 500-400 MG-UNIT per tablet Take 1 tablet by mouth daily.     . multivitamin (THERAGRAN) per tablet Take 1 tablet by mouth daily.       No current facility-administered medications on file prior to visit.    BP 134/80 mmHg  Temp(Src) 98.4 F (36.9 C) (Oral)  Ht 5\' 4"  (1.626 m)  Wt 206 lb 8 oz (93.668 kg)  BMI 35.43 kg/m2       Objective:   Physical Exam  Constitutional: She is oriented to person, place, and time. She appears well-developed and well-nourished. No distress.  Neck: Normal range of motion. Neck supple. No thyromegaly present.  Cardiovascular: Normal rate, regular rhythm, normal heart sounds and intact distal pulses.  Exam reveals no gallop and no friction rub.   No  murmur heard. Pulmonary/Chest: Effort normal and breath sounds normal. No respiratory distress. She has no wheezes. She has no rales. She exhibits no tenderness.  Lymphadenopathy:    She has no cervical adenopathy.  Neurological: She is alert and oriented to person, place, and time.  Skin: Skin is warm and dry. No rash noted. She is not diaphoretic. No erythema. No pallor.  Psychiatric: She has a normal mood and affect. Her behavior is normal. Thought content normal.  Vitals reviewed.     Assessment & Plan:  1. Essential hypertension, benign - losartan-hydrochlorothiazide (HYZAAR) 50-12.5 MG tablet; Take 1 tablet by mouth every morning.  Dispense: 90 tablet; Refill: 3 - Basic metabolic panel - Follow up in four months when she is back from her vacation 2. Abnormal TSH - TSH

## 2015-05-06 NOTE — Patient Instructions (Signed)
It was great seeing you again. I will follow up with you regarding your lab work.   Start taking a full pill of the Hyzaar. Monitor your blood pressure and let me know if you have any dizziness or lightheadedness.   Follow up with me when you get back.   Have a Happy Holiday season and great trip!

## 2015-05-06 NOTE — Progress Notes (Signed)
Pre visit review using our clinic review tool, if applicable. No additional management support is needed unless otherwise documented below in the visit note. 

## 2015-05-28 ENCOUNTER — Encounter: Payer: BC Managed Care – PPO | Admitting: Gynecology

## 2015-06-13 ENCOUNTER — Other Ambulatory Visit: Payer: Self-pay | Admitting: Family Medicine

## 2015-06-13 NOTE — Telephone Encounter (Signed)
Ok to refill. Thanks 

## 2015-09-10 ENCOUNTER — Encounter: Payer: Self-pay | Admitting: Pulmonary Disease

## 2015-09-10 ENCOUNTER — Ambulatory Visit (INDEPENDENT_AMBULATORY_CARE_PROVIDER_SITE_OTHER): Payer: Medicare Other | Admitting: Pulmonary Disease

## 2015-09-10 VITALS — BP 132/78 | HR 69 | Ht 64.0 in | Wt 213.0 lb

## 2015-09-10 DIAGNOSIS — E669 Obesity, unspecified: Secondary | ICD-10-CM | POA: Diagnosis not present

## 2015-09-10 DIAGNOSIS — J309 Allergic rhinitis, unspecified: Secondary | ICD-10-CM | POA: Diagnosis not present

## 2015-09-10 DIAGNOSIS — G4733 Obstructive sleep apnea (adult) (pediatric): Secondary | ICD-10-CM

## 2015-09-10 NOTE — Patient Instructions (Signed)
1. Continue using your cpap.  Return to clinic in 1 year

## 2015-09-10 NOTE — Addendum Note (Signed)
Addended by: Wynn Banker H on: 09/10/2015 10:30 AM   Modules accepted: Orders

## 2015-09-10 NOTE — Assessment & Plan Note (Signed)
Stable. flonase prn

## 2015-09-10 NOTE — Assessment & Plan Note (Addendum)
HST 10/2013:  AHI 25/hr. Machine was likely obtained in 2015. Uses CPAP. Feels better using it. More energy. Less sleepiness. Continue CPAP machine.Her primary insurance is stable,Medicare is her secondary per her. Download the last month (April 2017) 60%, AHI 4. Plan : 1. Cont current cpap machine. On autocpap. 2. If she switches to medicare primary, may be eligible for a new cpap machine sooner.

## 2015-09-10 NOTE — Progress Notes (Signed)
   Subjective:    Patient ID: Kylie Davis, female    DOB: 1951-01-21, 65 y.o.   MRN: [id number]  HPI Pt is being seen for OSA.    ROV (09/10/15)  Patient is here for her moderate sleep apnea. Last time she was seen was a year ago. Uses her CPAP. No issues with it. Feels benefit of CPAP. Last download the last month,60%, AHI 3.7. No new medical issues since last seen.   Review of Systems  Constitutional: Negative.   HENT: Negative.   Eyes: Negative.   Respiratory: Negative.   Cardiovascular: Negative.   Gastrointestinal: Negative.   Endocrine: Negative.   Genitourinary: Negative.   Musculoskeletal: Negative.   Skin: Negative.   Allergic/Immunologic: Negative.   Neurological: Negative.   Hematological: Negative.   Psychiatric/Behavioral: Negative.   All other systems reviewed and are negative.      Objective:   Physical Exam   Vitals:  Filed Vitals:   09/10/15 1007  BP: 132/78  Pulse: 69  Height: 5\' 4"  (1.626 m)  Weight: 213 lb (96.616 kg)  SpO2: 97%    Constitutional/General:  Pleasant, well-nourished, well-developed, not in any distress,  Comfortably seating.  Well kempt  Body mass index is 36.54 kg/(m^2). Wt Readings from Last 3 Encounters:  09/10/15 213 lb (96.616 kg)  05/06/15 206 lb 8 oz (93.668 kg)  02/28/15 216 lb 14.4 oz (98.385 kg)     HEENT: Pupils equal and reactive to light and accommodation. Anicteric sclerae. Normal nasal mucosa.   No oral  lesions,  mouth clear,  oropharynx clear, no postnasal drip. (-) Oral thrush. No dental caries.  Airway - Mallampati class III  Neck: No masses. Midline trachea. No JVD, (-) LAD. (-) bruits appreciated.  Respiratory/Chest: Grossly normal chest. (-) deformity. (-) Accessory muscle use.  Symmetric expansion. (-) Tenderness on palpation.  Resonant on percussion.  Diminished BS on both lower lung zones. (-) wheezing, crackles, rhonchi (-) egophony  Cardiovascular: Regular rate and  rhythm, heart sounds  normal, no murmur or gallops, no peripheral edema  Gastrointestinal:  Normal bowel sounds. Soft, non-tender. No hepatosplenomegaly.  (-) masses.   Musculoskeletal:  Normal muscle tone. Normal gait.   Extremities: Grossly normal. (-) clubbing, cyanosis.  (-) edema  Skin: (-) rash,lesions seen.   Neurological/Psychiatric : alert, oriented to time, place, person. Normal mood and affect           Assessment & Plan:  OSA (obstructive sleep apnea) HST 10/2013:  AHI 25/hr. Machine was likely obtained in 2015. Uses CPAP. Feels better using it. More energy. Less sleepiness. Continue CPAP machine.Her primary insurance is stable,Medicare is her secondary per her. Download the last month (April 2017) 60%, AHI 4. Plan : 1. Cont current cpap machine. On autocpap. 2. If she switches to medicare primary, may be eligible for a new cpap machine sooner.    Allergic rhinitis Stable. flonase prn  Obesity Weight reduction   Return to clinic in 1 yr  J. Shirl Harris, MD 09/10/2015, 10:27 AM  Pulmonary and Critical Care Pager [phone] After 3 pm or if no answer, call [phone]

## 2015-09-10 NOTE — Assessment & Plan Note (Signed)
Weight reduction 

## 2015-09-23 ENCOUNTER — Encounter: Payer: Self-pay | Admitting: Pulmonary Disease

## 2015-12-24 ENCOUNTER — Other Ambulatory Visit: Payer: Self-pay | Admitting: Gynecology

## 2015-12-24 DIAGNOSIS — Z1231 Encounter for screening mammogram for malignant neoplasm of breast: Secondary | ICD-10-CM

## 2015-12-30 ENCOUNTER — Ambulatory Visit
Admission: RE | Admit: 2015-12-30 | Discharge: 2015-12-30 | Disposition: A | Discharge status: Home / Self Care | Payer: Medicare Other | Source: Ambulatory Visit | Attending: Gynecology | Admitting: Gynecology

## 2015-12-30 DIAGNOSIS — Z1231 Encounter for screening mammogram for malignant neoplasm of breast: Secondary | ICD-10-CM

## 2016-02-04 ENCOUNTER — Ambulatory Visit (INDEPENDENT_AMBULATORY_CARE_PROVIDER_SITE_OTHER): Payer: Medicare Other | Admitting: Adult Health

## 2016-02-04 VITALS — BP 120/64 | Ht 64.0 in | Wt 212.6 lb

## 2016-02-04 DIAGNOSIS — M79675 Pain in left toe(s): Secondary | ICD-10-CM | POA: Diagnosis not present

## 2016-02-04 DIAGNOSIS — B351 Tinea unguium: Secondary | ICD-10-CM | POA: Diagnosis not present

## 2016-02-04 MED ORDER — DOXYCYCLINE HYCLATE 100 MG PO CAPS: 100.0000 mg | ORAL_CAPSULE | Freq: Two times a day (BID) | ORAL | 0 refills | Status: DC

## 2016-02-04 NOTE — Patient Instructions (Signed)
It was great seeing you again .   I have sent in a prescription for doxycycline. Take this twice a day for 7 days. Also soak your foot in warm salt water and epsom salt for 15 minutes twice a day   For the toe nail fungus you can try soaking your foot in warm water and apple cider vinegar

## 2016-02-04 NOTE — Progress Notes (Signed)
Subjective:    Patient ID: KALEEN DAHER, female    DOB: 1951-03-24, 65 y.o.   MRN: [id number]  HPI  65 year old female who presents to the office today for for pain in her left great toe x 24 hours. She reports that over the last 24 hours she has noticed pain at the distal tip on her left great toe as well as redness and swelling. She denies any warmth or drainage. She has not been experiencing any fevers and denies any acute trauma.   Review of Systems  Constitutional: Negative.   Musculoskeletal: Positive for joint swelling. Negative for gait problem.  Skin: Positive for color change.  All other systems reviewed and are negative.      Past Medical History:  Diagnosis Date  . Hypertension   . Sleep apnea    C PAP    Social History   Social History  . Marital status: Married    Spouse name: N/A  . Number of children: N/A  . Years of education: N/A   Occupational History  . retired Pharmacist, hospital Retired   Social History Main Topics  . Smoking status: Never Smoker  . Smokeless tobacco: Never Used  . Alcohol use 2.4 oz/week    4 Standard drinks or equivalent per week     Comment: weekly  . Drug use: No  . Sexual activity: Yes    Birth control/ protection: Post-menopausal     Comment: 1st intercourse 65 yo.---Fewer than 5 partners   Other Topics Concern  . Not on file   Social History Narrative   Retired from being a Education officer, museum.    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 CHOLECYSTECTOMY  2004    Family History  Problem Relation Age of Onset  . Cancer Mother 78    primary peritoneal cancer  . Alzheimer's disease Mother   . Cancer Father 24    stomach or pancreatic  . Heart disease Father   . Hypertension Brother   . Heart attack Brother 15    MI x 2  . Diabetes Paternal Grandmother   . Colon cancer Neg Hx   . Breast cancer Paternal Aunt 59    No Known Allergies  Current Outpatient  Prescriptions on File Prior to Visit  Medication Sig Dispense Refill  . aspirin 81 MG tablet Take 81 mg by mouth daily.      . calcium-vitamin D (SM CALCIUM 500/VITAMIN D3) 500-400 MG-UNIT per tablet Take 1 tablet by mouth daily.     . JUBLIA 10 % SOLN   12  . losartan-hydrochlorothiazide (HYZAAR) 50-12.5 MG tablet Take 1 tablet by mouth every morning. 90 tablet 3  . multivitamin (THERAGRAN) per tablet Take 1 tablet by mouth daily.       No current facility-administered medications on file prior to visit.     BP 120/64   Ht 5\' 4"  (1.626 m)   Wt 212 lb 9.6 oz (96.4 kg)   BMI 36.49 kg/m    Objective:   Physical Exam  Constitutional: She appears well-developed and well-nourished. No distress.  Musculoskeletal: Normal range of motion. She exhibits edema and tenderness. She exhibits no deformity.  Trace edema and redness noticed to distal tip of left great toe. There does appear to be a small area of localized infection at the tip of the toe. Tenderness around the distal tip and  lateral side of the toe nail. Onychomycosis noted in left great toe. Does not appear to be signs of ingrown toe nail.   Neurological: She is alert.  Skin: Skin is warm and dry. No rash noted. She is not diaphoretic. There is erythema. No pallor.  Psychiatric: She has a normal mood and affect. Her behavior is normal. Judgment and thought content normal.  Nursing note and vitals reviewed.     Assessment & Plan:  1. Pain of great toe, left - Small area of infection at tip of toe. No drainage noted. Area not large enough for I&D. Will try oral antibiotics - Warm epsom salt soaks BID x 7 days  - doxycycline (VIBRAMYCIN) 100 MG capsule; Take 1 capsule (100 mg total) by mouth 2 (two) times daily.  Dispense: 14 capsule; Refill: 0 - Follow up if no improvement 2. Onychomycosis - She would like to try apple cider vinegar soaks - Consider oral Lamisil in the future  [name], NP

## 2016-02-17 ENCOUNTER — Telehealth: Payer: Self-pay | Admitting: Adult Health

## 2016-02-17 NOTE — Telephone Encounter (Signed)
° ° °  Pt call to say she still has the infected toe and is asking if Tommi Rumps wanted to refer her to a foot doctor

## 2016-02-18 NOTE — Telephone Encounter (Signed)
I would like to see her again before we go to podiatry

## 2016-02-18 NOTE — Telephone Encounter (Signed)
Patient notified

## 2016-02-20 ENCOUNTER — Other Ambulatory Visit (INDEPENDENT_AMBULATORY_CARE_PROVIDER_SITE_OTHER): Payer: Medicare Other

## 2016-02-20 ENCOUNTER — Encounter: Payer: Self-pay | Admitting: Adult Health

## 2016-02-20 ENCOUNTER — Ambulatory Visit (INDEPENDENT_AMBULATORY_CARE_PROVIDER_SITE_OTHER): Payer: Medicare Other | Admitting: Adult Health

## 2016-02-20 VITALS — BP 130/72 | HR 74 | Temp 97.9°F | Resp 20 | Ht 64.0 in | Wt 214.2 lb

## 2016-02-20 DIAGNOSIS — Z0000 Encounter for general adult medical examination without abnormal findings: Secondary | ICD-10-CM

## 2016-02-20 DIAGNOSIS — Z23 Encounter for immunization: Secondary | ICD-10-CM

## 2016-02-20 DIAGNOSIS — E119 Type 2 diabetes mellitus without complications: Secondary | ICD-10-CM | POA: Diagnosis not present

## 2016-02-20 DIAGNOSIS — L600 Ingrowing nail: Secondary | ICD-10-CM | POA: Diagnosis not present

## 2016-02-20 LAB — CBC WITH DIFFERENTIAL/PLATELET
BASOS ABS: 0 10*3/uL (ref 0.0–0.1)
Basophils Relative: 0.8 % (ref 0.0–3.0)
EOS PCT: 4.7 % (ref 0.0–5.0)
Eosinophils Absolute: 0.3 10*3/uL (ref 0.0–0.7)
HEMATOCRIT: 40.4 % (ref 36.0–46.0)
HEMOGLOBIN: 13.8 g/dL (ref 12.0–15.0)
LYMPHS PCT: 40.8 % (ref 12.0–46.0)
Lymphs Abs: 2.5 10*3/uL (ref 0.7–4.0)
MCHC: 34.2 g/dL (ref 30.0–36.0)
MCV: 87.7 fl (ref 78.0–100.0)
MONOS PCT: 7.6 % (ref 3.0–12.0)
Monocytes Absolute: 0.5 10*3/uL (ref 0.1–1.0)
NEUTROS PCT: 46.1 % (ref 43.0–77.0)
Neutro Abs: 2.9 10*3/uL (ref 1.4–7.7)
Platelets: 289 10*3/uL (ref 150.0–400.0)
RBC: 4.61 Mil/uL (ref 3.87–5.11)
RDW: 14.1 % (ref 11.5–15.5)
WBC: 6.2 10*3/uL (ref 4.0–10.5)

## 2016-02-20 LAB — POC URINALSYSI DIPSTICK (AUTOMATED)
BILIRUBIN UA: NEGATIVE
Glucose, UA: NEGATIVE
KETONES UA: NEGATIVE
Leukocytes, UA: NEGATIVE
NITRITE UA: NEGATIVE
PH UA: 6.5
PROTEIN UA: NEGATIVE
RBC UA: NEGATIVE
Spec Grav, UA: <=1.005
Urobilinogen, UA: 0.2

## 2016-02-20 LAB — HEPATIC FUNCTION PANEL
ALBUMIN: 4 g/dL (ref 3.5–5.2)
ALK PHOS: 69 U/L (ref 39–117)
ALT: 22 U/L (ref 0–35)
AST: 18 U/L (ref 0–37)
Bilirubin, Direct: 0 mg/dL (ref 0.0–0.3)
TOTAL PROTEIN: 7.7 g/dL (ref 6.0–8.3)
Total Bilirubin: 0.5 mg/dL (ref 0.2–1.2)

## 2016-02-20 LAB — BASIC METABOLIC PANEL
BUN: 16 mg/dL (ref 6–23)
CHLORIDE: 100 meq/L (ref 96–112)
CO2: 31 mEq/L (ref 19–32)
Calcium: 8.9 mg/dL (ref 8.4–10.5)
Creatinine, Ser: 0.72 mg/dL (ref 0.40–1.20)
GFR: 86.03 mL/min (ref >60.00)
Glucose, Bld: 113 mg/dL — ABNORMAL HIGH (ref 70–99)
POTASSIUM: 4.5 meq/L (ref 3.5–5.1)
SODIUM: 138 meq/L (ref 135–145)

## 2016-02-20 LAB — LIPID PANEL
Cholesterol: 188 mg/dL (ref 0–200)
HDL: 49.5 mg/dL (ref >39.00)
LDL Cholesterol: 116 mg/dL — ABNORMAL HIGH (ref 0–99)
NONHDL: 138.31
Total CHOL/HDL Ratio: 4
Triglycerides: 110 mg/dL (ref 0.0–149.0)
VLDL: 22 mg/dL (ref 0.0–40.0)

## 2016-02-20 LAB — TSH: TSH: 4.37 u[IU]/mL (ref 0.35–4.50)

## 2016-02-20 LAB — HEMOGLOBIN A1C: Hgb A1c MFr Bld: 6 % (ref 4.6–6.5)

## 2016-02-20 NOTE — Progress Notes (Signed)
Pre visit review using our clinic review tool, if applicable. No additional management support is needed unless otherwise documented below in the visit note. 

## 2016-02-20 NOTE — Addendum Note (Signed)
Addended by: Marian Sorrow on: 02/20/2016 08:53 AM   Modules accepted: Orders

## 2016-02-20 NOTE — Progress Notes (Signed)
Subjective:    Patient ID: Kylie Davis, female    DOB: [birth year], 65 y.o.   MRN: [id number]  HPI  65 year old female who presents to the office today for follow up regarding pain in left great toe. I saw her on 02/04/2016 for this issue. She had a small pocket of localized infection at the tip of her toe. She was prescribed doxycycline 100mg  BID and advised Epsom salt soaks.   Today she reports that the swelling has resolved and she no longer appears to have an infection. She feels like her toenail is ingrown and would like to see a podiatrist.   Review of Systems  Constitutional: Negative.   Respiratory: Negative.   Cardiovascular: Negative.   Skin: Positive for color change and wound.  All other systems reviewed and are negative.  Past Medical History:  Diagnosis Date  . Hypertension   . Sleep apnea    C PAP    Social History   Social History  . Marital status: Married    Spouse name: N/A  . Number of children: N/A  . Years of education: N/A   Occupational History  . retired Pharmacist, hospital Retired   Social History Main Topics  . Smoking status: Never Smoker  . Smokeless tobacco: Never Used  . Alcohol use 2.4 oz/week    4 Standard drinks or equivalent per week     Comment: weekly  . Drug use: No  . Sexual activity: Yes    Birth control/ protection: Post-menopausal     Comment: 1st intercourse 65 yo.---Fewer than 5 partners   Other Topics Concern  . Not on file   Social History Narrative   Retired from being a Education officer, museum.    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 CHOLECYSTECTOMY  2004    Family History  Problem Relation Age of Onset  . Cancer Mother 17    primary peritoneal cancer  . Alzheimer's disease Mother   . Cancer Father 31    stomach or pancreatic  . Heart disease Father   . Hypertension Brother   . Heart attack Brother 43    MI x 2  . Diabetes Paternal Grandmother   . Colon  cancer Neg Hx   . Breast cancer Paternal Aunt 63    No Known Allergies  Current Outpatient Prescriptions on File Prior to Visit  Medication Sig Dispense Refill  . aspirin 81 MG tablet Take 81 mg by mouth daily.      . calcium-vitamin D (SM CALCIUM 500/VITAMIN D3) 500-400 MG-UNIT per tablet Take 1 tablet by mouth daily.     . JUBLIA 10 % SOLN   12  . losartan-hydrochlorothiazide (HYZAAR) 50-12.5 MG tablet Take 1 tablet by mouth every morning. 90 tablet 3  . multivitamin (THERAGRAN) per tablet Take 1 tablet by mouth daily.       No current facility-administered medications on file prior to visit.     BP 130/72 (BP Location: Left Arm, Patient Position: Sitting, Cuff Size: Large)   Pulse 74   Temp 97.9 F (36.6 C) (Oral)   Resp 20   Ht 5\' 4"  (1.626 m)   Wt 214 lb 4 oz (97.2 kg)   SpO2 99%   BMI 36.78 kg/m       Objective:   Physical Exam  Constitutional: She is oriented to person, place, and time. She appears  well-developed and well-nourished. No distress.  Cardiovascular: Normal rate, regular rhythm, normal heart sounds and intact distal pulses.  Exam reveals no gallop and no friction rub.   No murmur heard. Pulmonary/Chest: Effort normal and breath sounds normal. No respiratory distress. She has no wheezes. She has no rales. She exhibits no tenderness.  Musculoskeletal: Normal range of motion. She exhibits tenderness (distal tip of left great toe. ). She exhibits no edema or deformity.  Neurological: She is alert and oriented to person, place, and time.  Skin: Skin is warm and dry. No rash noted. She is not diaphoretic. No erythema.  Ingrown toenail of left great toe. No signs of infection   Psychiatric: She has a normal mood and affect. Her behavior is normal. Judgment and thought content normal.  Nursing note and vitals reviewed.      Assessment & Plan:  1. Ingrown nail of great toe of left foot - Ambulatory referral to Podiatry - Follow up as needed  [name],  NP

## 2016-02-21 ENCOUNTER — Encounter: Payer: Self-pay | Admitting: Podiatry

## 2016-02-21 ENCOUNTER — Ambulatory Visit (INDEPENDENT_AMBULATORY_CARE_PROVIDER_SITE_OTHER): Payer: Medicare Other | Admitting: Podiatry

## 2016-02-21 DIAGNOSIS — L600 Ingrowing nail: Secondary | ICD-10-CM | POA: Diagnosis not present

## 2016-02-21 DIAGNOSIS — B351 Tinea unguium: Secondary | ICD-10-CM

## 2016-02-21 NOTE — Progress Notes (Signed)
   Subjective:    Patient ID: Kylie Davis, female    DOB: Jul 17, 1950, 65 y.o.   MRN: [id number]  HPI  Patient presents to the office today for concerns of an ingrown toenail to the left big toe which is painful with presure and shoegear. She denies any redness or drainage from the area. She states that she has a nail fungus as well. She has been using jublia. The area does get swollen around the toenail. No other complaints today.   Review of Systems  All other systems reviewed and are negative.      Objective:   Physical Exam  General: AAO x3, NAD  Dermatological: There is incurvation around the left lateral  toenail and there is localized edema to the area without any erythema or increase in warmth. There is no drainage or pus. There is no significant pathology to the other toenails. No open lesions identified. The nail is somewhat hypertrophic, dystrophic, discolored.   Vascular: Dorsalis Pedis artery and Posterior Tibial artery pedal pulses are 2/4 bilateral with immedate capillary fill time. Pedal hair growth present. There is no pain with calf compression, swelling, warmth, erythema.   Neruologic: Grossly intact via light touch bilateral. Vibratory intact via tuning fork bilateral. Protective threshold with Semmes Wienstein monofilament intact to all pedal sites bilateral.   Musculoskeletal: No gross boney pedal deformities bilateral. No pain, crepitus, or limitation noted with foot and ankle range of motion bilateral. Muscular strength 5/5 in all groups tested bilateral.  Gait: Unassisted, Nonantalgic.      Assessment & Plan:  65 year old female left lateral hallux ingrown toenail, likely onychomycosis as well.  -Treatment options discussed including all alternatives, risks, and complications -Etiology of symptoms were discussed -At this time, the patient is requesting partial nail removal with chemical matricectomy to the symptomatic portion of the nail. Risks and  complications were discussed with the patient for which they understand and  verbally consent to the procedure. Under sterile conditions a total of 3 mL of a mixture of 2% lidocaine plain and 0.5% Marcaine plain was infiltrated in a hallux block fashion. Once anesthetized, the skin was prepped in sterile fashion. A tourniquet was then applied. Next the lateral aspect of hallux nail border was then sharply excised making sure to remove the entire offending nail border. Once the nails were ensured to be removed area was debrided and the underlying skin was intact. There is no purulence identified in the procedure. Next phenol was then applied under standard conditions and copiously irrigated. Silvadene was applied. A dry sterile dressing was applied. After application of the dressing the tourniquet was removed and there is found to be an immediate capillary refill time to the digit. The patient tolerated the procedure well any complications. Post procedure instructions were discussed the patient for which he verbally understood. Follow-up in one week for nail check or sooner if any problems are to arise. Discussed signs/symptoms of infection and directed to call the office immediately should any occur or go directly to the emergency room. In the meantime, encouraged to call the office with any questions, concerns, changes symptoms. -Nail sent for culture  Celesta Gentile, DPM

## 2016-02-21 NOTE — Patient Instructions (Signed)

## 2016-02-27 ENCOUNTER — Ambulatory Visit (INDEPENDENT_AMBULATORY_CARE_PROVIDER_SITE_OTHER): Payer: Medicare Other | Admitting: Adult Health

## 2016-02-27 ENCOUNTER — Encounter: Payer: Self-pay | Admitting: Adult Health

## 2016-02-27 VITALS — BP 128/74 | Temp 98.1°F | Ht 64.0 in | Wt 215.2 lb

## 2016-02-27 DIAGNOSIS — E6609 Other obesity due to excess calories: Secondary | ICD-10-CM | POA: Diagnosis not present

## 2016-02-27 DIAGNOSIS — R7309 Other abnormal glucose: Secondary | ICD-10-CM

## 2016-02-27 DIAGNOSIS — I10 Essential (primary) hypertension: Secondary | ICD-10-CM

## 2016-02-27 DIAGNOSIS — Z6835 Body mass index (BMI) 35.0-35.9, adult: Secondary | ICD-10-CM

## 2016-02-27 DIAGNOSIS — Z0000 Encounter for general adult medical examination without abnormal findings: Secondary | ICD-10-CM

## 2016-02-27 LAB — POCT GLYCOSYLATED HEMOGLOBIN (HGB A1C): Hemoglobin A1C: 5.7

## 2016-02-27 NOTE — Progress Notes (Signed)
Subjective:    Patient ID: Kylie Davis, female    DOB: [birth year], 65 y.o.   MRN: [id number]  HPI  Patient presents for yearly preventative medicine examination. She is a pleasant 65 year old female who  has a past medical history of Hypertension and Sleep apnea.  All immunizations and health maintenance protocols were reviewed with the patient and needed orders were placed.  Medication reconciliation,  past medical history, social history, problem list and allergies were reviewed in detail with the patient  Goals were established with regard to weight loss, exercise, and  diet in compliance with medications. She is trying to eat healthy. She is exercising multiple times per week  End of life planning was discussed.  She is up to date on mammogram, colonoscopy, dental and vision exams.   She has seen her Dermatologist this year and did not have any concerns for malignancy.   Review of Systems  Constitutional: Negative.   HENT: Negative.   Eyes: Negative.   Respiratory: Negative.   Cardiovascular: Negative.   Gastrointestinal: Negative.   Endocrine: Negative.   Genitourinary: Negative.   Musculoskeletal: Negative.   Allergic/Immunologic: Negative.   Neurological: Negative.   Hematological: Negative.   All other systems reviewed and are negative.  Past Medical History:  Diagnosis Date  . Hypertension   . Sleep apnea    C PAP    Social History   Social History  . Marital status: Married    Spouse name: N/A  . Number of children: N/A  . Years of education: N/A   Occupational History  . retired Pharmacist, hospital Retired   Social History Main Topics  . Smoking status: Never Smoker  . Smokeless tobacco: Never Used  . Alcohol use 2.4 oz/week    4 Standard drinks or equivalent per week     Comment: weekly  . Drug use: No  . Sexual activity: Yes    Birth control/ protection: Post-menopausal     Comment: 1st intercourse 65 yo.---Fewer than 5 partners   Other Topics  Concern  . Not on file   Social History Narrative   Retired from being a Education officer, museum.    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 CHOLECYSTECTOMY  2004    Family History  Problem Relation Age of Onset  . Cancer Mother 75    primary peritoneal cancer  . Alzheimer's disease Mother   . Cancer Father 105    stomach or pancreatic  . Heart disease Father   . Hypertension Brother   . Heart attack Brother 62    MI x 2  . Diabetes Paternal Grandmother   . Colon cancer Neg Hx   . Breast cancer Paternal Aunt 19    No Known Allergies  Current Outpatient Prescriptions on File Prior to Visit  Medication Sig Dispense Refill  . aspirin 81 MG tablet Take 81 mg by mouth daily.      . calcium-vitamin D (SM CALCIUM 500/VITAMIN D3) 500-400 MG-UNIT per tablet Take 1 tablet by mouth daily.     . JUBLIA 10 % SOLN   12  . losartan-hydrochlorothiazide (HYZAAR) 50-12.5 MG tablet Take 1 tablet by mouth every morning. 90 tablet 3  . multivitamin (THERAGRAN) per tablet Take 1 tablet by mouth daily.       No current facility-administered medications on file prior to visit.     BP 128/74  Temp 98.1 F (36.7 C) (Oral)   Ht 5\' 4"  (1.626 m)   Wt 215 lb 3.2 oz (97.6 kg)   BMI 36.94 kg/m       Objective:   Physical Exam  Constitutional: She is oriented to person, place, and time. She appears well-developed and well-nourished. No distress.  obese  HENT:  Head: Normocephalic and atraumatic.  Right Ear: External ear normal.  Left Ear: External ear normal.  Nose: Nose normal.  Mouth/Throat: Oropharynx is clear and moist. No oropharyngeal exudate.  Eyes: Conjunctivae and EOM are normal. Pupils are equal, round, and reactive to light. Right eye exhibits no discharge. Left eye exhibits no discharge.  Neck: Normal range of motion. Neck supple. No tracheal deviation present. No thyromegaly present.  Cardiovascular: Normal rate, regular  rhythm, normal heart sounds and intact distal pulses.  Exam reveals no gallop and no friction rub.   No murmur heard. Pulmonary/Chest: Effort normal and breath sounds normal. No respiratory distress. She has no wheezes. She has no rales. She exhibits no tenderness.  Abdominal: Soft. Bowel sounds are normal. She exhibits no distension and no mass. There is no tenderness. There is no rebound and no guarding.  Genitourinary:  Genitourinary Comments: Breast Exam: No masses, lumps, dimpling or discharge  Musculoskeletal: Normal range of motion.  Lymphadenopathy:    She has no cervical adenopathy.  Neurological: She is alert and oriented to person, place, and time. No cranial nerve deficit. Coordination normal.  Skin: Skin is warm and dry. No rash noted. No erythema. No pallor.  Psychiatric: She has a normal mood and affect. Her behavior is normal. Judgment and thought content normal.  Nursing note and vitals reviewed.     Assessment & Plan:  1. Routine general medical examination at a health care facility - Reviewed labs in detail. All questions answered.  - EKG 12-Lead - Would like to have her lose weight through diet and exercise.  - Follow up in one year or sooner if needed.   2. Essential hypertension, benign - Near Goal today  - EKG 12-Lead- SR, Nonspecific ST depression, rate 71  - Will continue to monitor  3. Class 2 obesity due to excess calories without serious comorbidity with body mass index (BMI) of 35.0 to 35.9 in adult - educated on the importance of a heart healthy diet and frequent exercise  4. Elevated glucose - POC HgB A1c- 5.7  [name], NP

## 2016-02-28 ENCOUNTER — Ambulatory Visit (INDEPENDENT_AMBULATORY_CARE_PROVIDER_SITE_OTHER): Payer: Medicare Other | Admitting: Podiatry

## 2016-02-28 DIAGNOSIS — B351 Tinea unguium: Secondary | ICD-10-CM

## 2016-03-02 NOTE — Progress Notes (Signed)
Subjective: Kylie Davis is a 65 y.o.  female returns to office today for follow up evaluation after having left lateral Hallux permanent partial nail avulsion performed. Also presents to discuss nail biopsy results. Patient has been soaking using epsom salts and applying topical antibiotic covered with bandaid daily. Denies any pain or drainage/redness. Patient denies fevers, chills, nausea, vomiting. Denies any calf pain, chest pain, SOB.   Objective:  Vitals: Reviewed  General: Well developed, nourished, in no acute distress, alert and oriented x3   Dermatology: Skin is warm, dry and supple bilateral. Left hallux nail border appears to be clean, dry, with mild granular tissue and surrounding scab. There is no surrounding erythema, edema, drainage/purulence. The remaining nails appear unremarkable at this time. There are no other lesions or other signs of infection present.  Neurovascular status: Intact. No lower extremity swelling; No pain with calf compression bilateral.  Musculoskeletal: No tenderness to palpation of the left lateral hallux nail fold. Muscular strength within normal limits bilateral.   Assesement and Plan: S/p partial nail avulsion, doing well.   -Continue soaking in epsom salts twice a day followed by antibiotic ointment and a band-aid. Can leave uncovered at night. Continue this until completely healed.  -Nail culture results were discussed the patient. She is to proceed with topical therapy. Ordered a compound cream for onychomycosis. -If the area has not healed in 2 weeks, call the office for follow-up appointment, or sooner if any problems arise.  -Monitor for any signs/symptoms of infection. Call the office immediately if any occur or go directly to the emergency room. Call with any questions/concerns.  Celesta Gentile, DPM

## 2016-04-14 ENCOUNTER — Telehealth: Payer: Self-pay | Admitting: Adult Health

## 2016-04-14 ENCOUNTER — Other Ambulatory Visit: Payer: Self-pay | Admitting: Adult Health

## 2016-04-14 DIAGNOSIS — E2839 Other primary ovarian failure: Secondary | ICD-10-CM

## 2016-04-14 NOTE — Telephone Encounter (Signed)
Order entered

## 2016-04-14 NOTE — Telephone Encounter (Signed)
Pt needs a bone density and would like the referral sent to the  [facility]

## 2016-04-15 NOTE — Telephone Encounter (Signed)
Patient notified that order has been placed, and appt can be made at her convenience.

## 2016-04-21 ENCOUNTER — Ambulatory Visit
Admission: RE | Admit: 2016-04-21 | Discharge: 2016-04-21 | Disposition: A | Discharge status: Home / Self Care | Payer: Medicare Other | Source: Ambulatory Visit | Attending: Adult Health | Admitting: Adult Health

## 2016-04-21 DIAGNOSIS — E2839 Other primary ovarian failure: Secondary | ICD-10-CM

## 2016-05-04 ENCOUNTER — Encounter: Payer: Self-pay | Admitting: Podiatry

## 2016-05-04 ENCOUNTER — Ambulatory Visit (INDEPENDENT_AMBULATORY_CARE_PROVIDER_SITE_OTHER): Payer: Medicare Other | Admitting: Podiatry

## 2016-05-04 DIAGNOSIS — B351 Tinea unguium: Secondary | ICD-10-CM | POA: Diagnosis not present

## 2016-05-04 NOTE — Progress Notes (Signed)
Subjective: Kylie Davis is a 65 y.o.  female returns to office today for follow up evaluation after having left lateral Hallux permanent partial nail avulsion performed. She states that she is doing well but would like to have the area checked. She has been using the topical anti-fungal on the nails which has been helping. She is asking for the nail to be trimmed today. No redness, drainage, swelling or signs of infections.  Patient denies fevers, chills, nausea, vomiting. Denies any calf pain, chest pain, SOB.   Objective:  Vitals: Reviewed  General: Well developed, nourished, in no acute distress, alert and oriented x3   Dermatology: Skin is warm, dry and supple bilateral. Left hallux nail border appears to be clean, dry and the wound has healed. It appears to be doing very well. There is no swelling, redness, pain to the procedure site. The nail is hypertrophic, dystrophic with yellow brown discoloration to the nail distally. There is evidence of clearing proximally. There is no surrounding erythema, edema, drainage/purulence. The remaining nails appear unremarkable at this time. There are no other lesions or other signs of infection present.  Neurovascular status: Intact. No lower extremity swelling; No pain with calf compression bilateral.  Musculoskeletal: No tenderness to palpation of the left lateral hallux nail fold. Muscular strength within normal limits bilateral.   Assesement and Plan: Healed procedure site to the left hallux; onychomycyosis  -Nail procedure site has healed -Nail was debrided without complications or bleeding. Continue the topical treatment. She is going to the [place] until the Spring. Recommended her to follow-up once she comes back if there are anyfurther concerns.  -Monitor for any signs/symptoms of infection. Call the office immediately if any occur or go directly to the emergency room. Call with any questions/concerns.  Celesta Gentile, DPM

## 2016-10-29 ENCOUNTER — Other Ambulatory Visit: Payer: Self-pay | Admitting: Adult Health

## 2016-10-29 DIAGNOSIS — I10 Essential (primary) hypertension: Secondary | ICD-10-CM

## 2016-12-01 ENCOUNTER — Ambulatory Visit (INDEPENDENT_AMBULATORY_CARE_PROVIDER_SITE_OTHER): Payer: Medicare Other | Admitting: Adult Health

## 2016-12-01 VITALS — BP 142/68 | HR 82 | Temp 98.4°F | Ht 64.0 in | Wt 233.2 lb

## 2016-12-01 DIAGNOSIS — Z4802 Encounter for removal of sutures: Secondary | ICD-10-CM

## 2016-12-01 MED ORDER — CEPHALEXIN 500 MG PO CAPS: 500.0000 mg | ORAL_CAPSULE | Freq: Three times a day (TID) | ORAL | 0 refills | Status: AC

## 2016-12-01 NOTE — Progress Notes (Signed)
Subjective:    Patient ID: Kylie Davis, female    DOB: 07-Dec-1950, 65 y.o.   MRN: [id number]  HPI  66 year old female who  has a past medical history of Hypertension and Sleep apnea. She presents to the office today for suture removal from right pinkie toe. She had sutures [place] in [place] 10 days ago after she injured her foot on a boat.   She denies any foul odor or pain to area of trauma.    Review of Systems See HP I  Past Medical History:  Diagnosis Date  . Hypertension   . Sleep apnea    C PAP    Social History   Social History  . Marital status: Married    Spouse name: N/A  . Number of children: N/A  . Years of education: N/A   Occupational History  . retired Pharmacist, hospital Retired   Social History Main Topics  . Smoking status: Never Smoker  . Smokeless tobacco: Never Used  . Alcohol use 2.4 oz/week    4 Standard drinks or equivalent per week     Comment: weekly  . Drug use: No  . Sexual activity: Yes    Birth control/ protection: Post-menopausal     Comment: 1st intercourse 66 yo.---Fewer than 5 partners   Other Topics Concern  . Not on file   Social History Narrative   Retired from being a Education officer, [employer].    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 CHOLECYSTECTOMY  2004    Family History  Problem Relation Age of Onset  . Cancer Mother 69       primary peritoneal cancer  . Alzheimer's disease Mother   . Cancer Father 69       stomach or pancreatic  . Heart disease Father   . Hypertension Brother   . Heart attack Brother 23       MI x 2  . Diabetes Paternal Grandmother   . Colon cancer Neg Hx   . Breast cancer Paternal Aunt 53    No Known Allergies  Current Outpatient Prescriptions on File Prior to Visit  Medication Sig Dispense Refill  . aspirin 81 MG tablet Take 81 mg by mouth daily.      . calcium-vitamin D (SM CALCIUM 500/VITAMIN D3) 500-400 MG-UNIT per tablet Take 1  tablet by mouth daily.     . JUBLIA 10 % SOLN   12  . losartan-hydrochlorothiazide (HYZAAR) 50-12.5 MG tablet TAKE 1 TABLET BY MOUTH EVERY MORNING 90 tablet 0  . multivitamin (THERAGRAN) per tablet Take 1 tablet by mouth daily.       No current facility-administered medications on file prior to visit.     BP (!) 142/68 (BP Location: Left Arm, Patient Position: Sitting, Cuff Size: Normal)   Pulse 82   Temp 98.4 F (36.9 C) (Oral)   Ht 5\' 4"  (1.626 m)   Wt 233 lb 3.2 oz (105.8 kg)   SpO2 97%   BMI 40.03 kg/m       Objective:   Physical Exam  Constitutional: She is oriented to person, place, and time. She appears well-developed and well-nourished. No distress.  Neurological: She is alert and oriented to person, place, and time.  Skin: Skin is warm and dry. No rash noted. She is not diaphoretic. No erythema. No pallor.  Removed four simple sutures from right pinkie  toe. There is localized infection between the pinkie toe and fourth toe.   Psychiatric: She has a normal mood and affect. Her behavior is normal. Thought content normal.  Nursing note and vitals reviewed.     Assessment & Plan:  1. Visit for suture removal - Will treat infection with Keflex.  - cephALEXin (KEFLEX) 500 MG capsule; Take 1 capsule (500 mg total) by mouth 3 (three) times daily.  Dispense: 30 capsule; Refill: 0 - Follow up if no improvement with infection  - Keep area clean and dry   [name], NP

## 2016-12-21 ENCOUNTER — Other Ambulatory Visit: Payer: Self-pay | Admitting: Gynecology

## 2016-12-21 DIAGNOSIS — Z1231 Encounter for screening mammogram for malignant neoplasm of breast: Secondary | ICD-10-CM

## 2017-01-05 ENCOUNTER — Ambulatory Visit
Admission: RE | Admit: 2017-01-05 | Discharge: 2017-01-05 | Disposition: A | Discharge status: Home / Self Care | Payer: Medicare Other | Source: Ambulatory Visit | Attending: Gynecology | Admitting: Gynecology

## 2017-01-05 DIAGNOSIS — Z1231 Encounter for screening mammogram for malignant neoplasm of breast: Secondary | ICD-10-CM

## 2017-01-25 ENCOUNTER — Other Ambulatory Visit: Payer: Self-pay | Admitting: Adult Health

## 2017-01-25 DIAGNOSIS — I10 Essential (primary) hypertension: Secondary | ICD-10-CM

## 2017-02-05 ENCOUNTER — Encounter: Payer: Self-pay | Admitting: Adult Health

## 2017-03-02 ENCOUNTER — Ambulatory Visit: Payer: Medicare Other | Admitting: Pulmonary Disease

## 2017-03-03 ENCOUNTER — Encounter: Payer: Medicare Other | Admitting: Adult Health

## 2017-03-03 ENCOUNTER — Ambulatory Visit (INDEPENDENT_AMBULATORY_CARE_PROVIDER_SITE_OTHER): Payer: Medicare Other | Admitting: Adult Health

## 2017-03-03 ENCOUNTER — Encounter: Payer: Self-pay | Admitting: Adult Health

## 2017-03-03 VITALS — BP 122/72 | Temp 98.4°F | Ht 63.5 in | Wt 231.0 lb

## 2017-03-03 DIAGNOSIS — Z1159 Encounter for screening for other viral diseases: Secondary | ICD-10-CM

## 2017-03-03 DIAGNOSIS — Z23 Encounter for immunization: Secondary | ICD-10-CM | POA: Diagnosis not present

## 2017-03-03 DIAGNOSIS — E6609 Other obesity due to excess calories: Secondary | ICD-10-CM

## 2017-03-03 DIAGNOSIS — Z0000 Encounter for general adult medical examination without abnormal findings: Secondary | ICD-10-CM | POA: Diagnosis not present

## 2017-03-03 DIAGNOSIS — Z6835 Body mass index (BMI) 35.0-35.9, adult: Secondary | ICD-10-CM

## 2017-03-03 DIAGNOSIS — I10 Essential (primary) hypertension: Secondary | ICD-10-CM

## 2017-03-03 LAB — LIPID PANEL
Cholesterol: 167 mg/dL (ref 0–200)
HDL: 47.1 mg/dL (ref >39.00)
LDL Cholesterol: 97 mg/dL (ref 0–99)
NONHDL: 119.69
Total CHOL/HDL Ratio: 4
Triglycerides: 111 mg/dL (ref 0.0–149.0)
VLDL: 22.2 mg/dL (ref 0.0–40.0)

## 2017-03-03 LAB — HEPATIC FUNCTION PANEL
ALK PHOS: 63 U/L (ref 39–117)
ALT: 26 U/L (ref 0–35)
AST: 18 U/L (ref 0–37)
Albumin: 4 g/dL (ref 3.5–5.2)
BILIRUBIN DIRECT: 0.1 mg/dL (ref 0.0–0.3)
TOTAL PROTEIN: 7.2 g/dL (ref 6.0–8.3)
Total Bilirubin: 0.4 mg/dL (ref 0.2–1.2)

## 2017-03-03 LAB — CBC WITH DIFFERENTIAL/PLATELET
Basophils Absolute: 0 10*3/uL (ref 0.0–0.1)
Basophils Relative: 0.7 % (ref 0.0–3.0)
EOS PCT: 4.1 % (ref 0.0–5.0)
Eosinophils Absolute: 0.2 10*3/uL (ref 0.0–0.7)
HEMATOCRIT: 39.7 % (ref 36.0–46.0)
Hemoglobin: 13.4 g/dL (ref 12.0–15.0)
LYMPHS ABS: 1.9 10*3/uL (ref 0.7–4.0)
Lymphocytes Relative: 33.7 % (ref 12.0–46.0)
MCHC: 33.8 g/dL (ref 30.0–36.0)
MCV: 90 fl (ref 78.0–100.0)
MONOS PCT: 8.3 % (ref 3.0–12.0)
Monocytes Absolute: 0.5 10*3/uL (ref 0.1–1.0)
NEUTROS PCT: 53.2 % (ref 43.0–77.0)
Neutro Abs: 3 10*3/uL (ref 1.4–7.7)
Platelets: 292 10*3/uL (ref 150.0–400.0)
RBC: 4.41 Mil/uL (ref 3.87–5.11)
RDW: 14.5 % (ref 11.5–15.5)
WBC: 5.6 10*3/uL (ref 4.0–10.5)

## 2017-03-03 LAB — BASIC METABOLIC PANEL
BUN: 15 mg/dL (ref 6–23)
CHLORIDE: 100 meq/L (ref 96–112)
CO2: 28 mEq/L (ref 19–32)
Calcium: 9.4 mg/dL (ref 8.4–10.5)
Creatinine, Ser: 0.84 mg/dL (ref 0.40–1.20)
GFR: 71.78 mL/min (ref >60.00)
GLUCOSE: 118 mg/dL — AB (ref 70–99)
POTASSIUM: 4.3 meq/L (ref 3.5–5.1)
Sodium: 138 mEq/L (ref 135–145)

## 2017-03-03 LAB — TSH: TSH: 3.92 u[IU]/mL (ref 0.35–4.50)

## 2017-03-03 LAB — HEMOGLOBIN A1C: Hgb A1c MFr Bld: 6.2 % (ref 4.6–6.5)

## 2017-03-03 NOTE — Progress Notes (Addendum)
Subjective:   Kylie Davis is a 66 y.o. female who presents for an Initial Medicare Annual Wellness Visit.  The Patient was informed that the wellness visit is to identify future health risk and educate and initiate measures that can reduce risk for increased disease through the lifespan.    Annual Wellness Assessment  Reports health as good  Preventive Screening -Counseling & Management  Medicare Annual Preventive Care Visit - Subsequent Last OV was seen today    VS reviewed;   Diet  Average, states she knows she needs to lose weight Plans is to call her friend and get started on weight loss with a buddy. She has lost weight in the past.   BMI 40   [facility] has been [facility] and not able to exercise per her ususal    Stressors: doing well   Sleep patterns: sleeps well most of the time  Still fighting with the cpap machine but does use it     Cardiac Risk Factors Addressed Hyperlipidemia- to be checked today Chol/hdl ratio is 4; hdl 49; trig 100  Pre-diabetes was 6.0 and back to 5.8 Recheck today  Obesity discussed risk   Advanced Directives - completed   Patient Care Team: Kylie Peng, NP as PCP - General (Family Medicine)    Cardiac Risk Factors include: advanced age (>110men, >42 women);family history of premature cardiovascular disease;hypertension;obesity (BMI >30kg/m2)     Objective:    Today's Vitals   03/03/17 0727  BP: 122/72  Temp: 98.4 F (36.9 C)  TempSrc: Oral  Weight: 231 lb (104.8 kg)  Height: 5' 3.5" (1.613 m)   Body mass index is 40.28 kg/m.   Current Medications (verified) Outpatient Encounter Prescriptions as of 03/03/2017  Medication Sig  . aspirin 81 MG tablet Take 81 mg by mouth daily.    . calcium-vitamin D (SM CALCIUM 500/VITAMIN D3) 500-400 MG-UNIT per tablet Take 1 tablet by mouth daily.   Marland Kitchen losartan-hydrochlorothiazide (HYZAAR) 50-12.5 MG tablet TAKE 1 TABLET BY MOUTH EVERY MORNING  .  multivitamin (THERAGRAN) per tablet Take 1 tablet by mouth daily.    . [DISCONTINUED] JUBLIA 10 % SOLN    No facility-administered encounter medications on file as of 03/03/2017.     Allergies (verified) Patient has no known allergies.   History: Past Medical History:  Diagnosis Date  . Hypertension   . Sleep apnea    C PAP   Past Surgical History:  Procedure Laterality Date  . CHOLECYSTECTOMY  2004   Family History  Problem Relation Age of Onset  . Cancer Mother 75       primary peritoneal cancer  . Alzheimer's disease Mother   . Cancer Father 14       stomach or pancreatic  . Heart disease Father   . Hypertension Brother   . Heart attack Brother 11       MI x 2  . Diabetes Paternal Grandmother   . Breast cancer Paternal Aunt 72  . Colon cancer Neg Hx    Social History   Occupational History  . retired Pharmacist, hospital Retired   Social History Main Topics  . Smoking status: Never Smoker  . Smokeless tobacco: Never Used  . Alcohol use 2.4 oz/week    4 Standard drinks or equivalent per week     Comment: weekly  . Drug use: No  . Sexual activity: Yes    Birth control/ protection: Post-menopausal     Comment: 1st  intercourse 66 yo.---Fewer than 5 partners    Tobacco Counseling Counseling given: Yes   Activities of Daily Living In your present state of health, do you have any difficulty performing the following activities: 03/03/2017  Hearing? N  Vision? N  Difficulty concentrating or making decisions? N  Walking or climbing stairs? N  Dressing or bathing? N  Doing errands, shopping? N  Preparing Food and eating ? N  Using the Toilet? N  In the past six months, have you accidently leaked urine? N  Do you have problems with loss of bowel control? N  Managing your Medications? N  Managing your Finances? N  Housekeeping or managing your Housekeeping? N  Some recent data might be hidden    Immunizations and Health Maintenance Immunization History    Administered Date(s) Administered  . Influenza Split 02/25/2012, 03/18/2013, 02/02/2014  . Influenza, High Dose Seasonal PF 02/28/2015, 02/20/2016, 03/03/2017  . Pneumococcal Conjugate-13 02/28/2015  . Pneumococcal Polysaccharide-23 02/20/2016  . Td 05/19/2003  . Tdap 09/13/2013  . Zoster 12/15/2010   Health Maintenance Due  Topic Date Due  . Hepatitis C Screening  [birth year]    Patient Care Team: Kylie Peng, NP as PCP - General (Family Medicine)  Indicate any recent Medical Services you may have received from other than [facility] providers in the past year (date may be approximate).     Assessment:   This is a routine wellness examination for Kylie Davis.   Hearing/Vision screen Hearing Screening Comments: No issues  Vision Screening Comments: Vision check ; to schedule exam Just rec'd a card to schedule  No issues to date   Dietary issues and exercise activities discussed: Current Exercise Habits: Home exercise routine (will get back to a routine soon )  Goals    . Weight (lb) < 200 lb (90.7 kg)          Go talk to your friend and buddy up to lose weight       Depression Screen PHQ 2/9 Scores 03/03/2017 02/28/2015 04/02/2014  PHQ - 2 Score 0 0 0    Fall Risk Fall Risk  03/03/2017 02/28/2015 04/02/2014  Falls in the past year? No No No    Cognitive Function:    no issues; Ad8 score 0     Screening Tests Health Maintenance  Topic Date Due  . Hepatitis C Screening  09/11/1950  . FOOT EXAM  02/15/2018 (Originally 09/19/1959)  . HEMOGLOBIN A1C  03/03/2018 (Originally 08/27/2016)  . OPHTHALMOLOGY EXAM  03/09/2018 (Originally 09/19/1959)  . MAMMOGRAM  01/06/2019  . COLONOSCOPY  02/25/2023  . TETANUS/TDAP  09/14/2023  . INFLUENZA VACCINE  Completed  . DEXA SCAN  Completed  . PNA vac Low Risk Adult  Completed      Plan:     PCP Notes  Health Maintenance Discussed Pre-diabetes and weight loss Set a goal for weight loss Hep c to be drawn today  Eye exam to  be scheduled with practice on [place]. Has regular eye checks   Mammogram 11/2016; norma. Dexa normal   Abnormal Screens  BMI but is going to try to rally her friend and start a weight loss initiative together   Referrals  None  Patient concerns; Cpap bothers her but uses it most nights   Nurse Concerns; As noted   Next PCP apt TBS       I have personally reviewed and noted the following in the patient's chart:   . Medical and social history . Use of alcohol, tobacco  or illicit drugs  . Current medications and supplements . Functional ability and status . Nutritional status . Physical activity . Advanced directives . List of other physicians . Hospitalizations, surgeries, and ER visits in previous 12 months . Vitals . Screenings to include cognitive, depression, and falls . Referrals and appointments  In addition, I have reviewed and discussed with patient certain preventive protocols, quality metrics, and best practice recommendations. A written personalized care plan for preventive services as well as general preventive health recommendations were provided to patient.     XLEZV,GJFTN, RN   03/03/2017   I have reviewed and agree with this plan  - Kylie Davis, AGNP

## 2017-03-03 NOTE — Progress Notes (Signed)
Subjective:    Patient ID: Kylie Davis, female    DOB: 11/21/1950, 65 y.o.   MRN: [id number]  HPI  Patient presents for yearly preventative medicine examination. She is a pleasant 66 year old female who  has a past medical history of Hypertension and Sleep apnea.  She takes Hyzaar 50-12.5 mg for essential hypertension   She takes a Calcium and Vitamin D supplement for bone health   She is using her CPAP on a regular basis  All immunizations and health maintenance protocols were reviewed with the patient and needed orders were placed.  Appropriate screening laboratory values were ordered for the patient including screening of hyperlipidemia, renal function and hepatic function.  Medication reconciliation,  past medical history, social history, problem list and allergies were reviewed in detail with the patient  Goals were established with regard to weight loss, exercise, and  diet in compliance with medications. She is not exercising as much as she would like to and is eating out a lot more due to traveling.   Wt Readings from Last 3 Encounters:  03/03/17 231 lb (104.8 kg)  12/01/16 233 lb 3.2 oz (105.8 kg)  02/27/16 215 lb 3.2 oz (97.6 kg)   End of life planning was discussed.  She is up to date on her mammogram, and dexa scan  and colonoscopy. She participates in routine dental and vision care  She has seen her Dermatologist this year and did not have any concerns for malignancy.   Review of Systems  Constitutional: Negative.   HENT: Negative.   Eyes: Negative.   Respiratory: Negative.   Cardiovascular: Negative.   Gastrointestinal: Negative.   Endocrine: Negative.   Genitourinary: Negative.   Musculoskeletal: Positive for arthralgias and back pain.  Skin: Negative.   Allergic/Immunologic: Negative.   Neurological: Negative.   Hematological: Negative.   Psychiatric/Behavioral: Negative.   All other systems reviewed and are negative.  Past Medical History:    Diagnosis Date  . Hypertension   . Sleep apnea    C PAP    Social History   Social History  . Marital status: Married    Spouse name: N/A  . Number of children: N/A  . Years of education: N/A   Occupational History  . retired Pharmacist, hospital Retired   Social History Main Topics  . Smoking status: Never Smoker  . Smokeless tobacco: Never Used  . Alcohol use 2.4 oz/week    4 Standard drinks or equivalent per week     Comment: weekly  . Drug use: No  . Sexual activity: Yes    Birth control/ protection: Post-menopausal     Comment: 1st intercourse 66 yo.---Fewer than 5 partners   Other Topics Concern  . Not on file   Social History Narrative   Retired from being a Education officer, [employer].    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 CHOLECYSTECTOMY  2004    Family History  Problem Relation Age of Onset  . Cancer Mother 64       primary peritoneal cancer  . Alzheimer's disease Mother   . Cancer Father 25       stomach or pancreatic  . Heart disease Father   . Hypertension Brother   . Heart attack Brother 50       MI x 2  . Diabetes Paternal Grandmother   . Breast cancer Paternal Aunt 52  .  Colon cancer Neg Hx     No Known Allergies  Current Outpatient Prescriptions on File Prior to Visit  Medication Sig Dispense Refill  . aspirin 81 MG tablet Take 81 mg by mouth daily.      . calcium-vitamin D (SM CALCIUM 500/VITAMIN D3) 500-400 MG-UNIT per tablet Take 1 tablet by mouth daily.     Marland Kitchen losartan-hydrochlorothiazide (HYZAAR) 50-12.5 MG tablet TAKE 1 TABLET BY MOUTH EVERY MORNING 90 tablet 0  . multivitamin (THERAGRAN) per tablet Take 1 tablet by mouth daily.       No current facility-administered medications on file prior to visit.     BP 122/72 (BP Location: Left Arm)   Temp 98.4 F (36.9 C) (Oral)   Ht 5' 3.5" (1.613 m)   Wt 231 lb (104.8 kg)   BMI 40.28 kg/m       Objective:   Physical Exam   Constitutional: She is oriented to person, place, and time. She appears well-developed and well-nourished. No distress.  Obese   HENT:  Head: Normocephalic and atraumatic.  Right Ear: External ear normal.  Left Ear: External ear normal.  Nose: Nose normal.  Mouth/Throat: Oropharynx is clear and moist. No oropharyngeal exudate.  Eyes: Pupils are equal, round, and reactive to light. Conjunctivae and EOM are normal. Right eye exhibits no discharge. Left eye exhibits no discharge. No scleral icterus.  Neck: Normal range of motion. Neck supple. No JVD present. Carotid bruit is not present. No tracheal deviation present. No thyroid mass and no thyromegaly present.  Cardiovascular: Normal rate, regular rhythm, normal heart sounds and intact distal pulses.  Exam reveals no gallop and no friction rub.   No murmur heard. Pulmonary/Chest: Effort normal and breath sounds normal. No stridor. No respiratory distress. She has no wheezes. She has no rales. She exhibits no tenderness.  Abdominal: Soft. Bowel sounds are normal. She exhibits no distension and no mass. There is no tenderness. There is no rebound and no guarding.  Genitourinary:  Genitourinary Comments: Refused breast exam   Musculoskeletal: Normal range of motion. She exhibits no edema, tenderness or deformity.  Lymphadenopathy:    She has no cervical adenopathy.  Neurological: She is alert and oriented to person, place, and time. She has normal reflexes. She displays normal reflexes. No cranial nerve deficit. She exhibits normal muscle tone. Coordination normal.  Skin: Skin is warm and dry. No rash noted. She is not diaphoretic. No erythema. No pallor.  Psychiatric: She has a normal mood and affect. Her behavior is normal. Judgment and thought content normal.  Nursing note and vitals reviewed.     Assessment & Plan:  1. Routine general medical examination at a health care facility - Needs to lose weight. Encouraged life style  modifications  - Follow up in one year or sooner if needed - Basic metabolic panel - CBC with Differential/Platelet - Hepatic function panel - Lipid panel - TSH - Hep C Antibody  2. Essential hypertension, benign - Well controlled. No change in medications  - Basic metabolic panel - CBC with Differential/Platelet - Hepatic function panel - Lipid panel - TSH  3. Class 2 obesity due to excess calories without serious comorbidity with body mass index (BMI) of 35.0 to 35.9 in adult - Encouraged life style modifications  - Basic metabolic panel - CBC with Differential/Platelet - Hepatic function panel - Lipid panel - TSH  4. Need for hepatitis C screening test  - Hep C Antibody  5. Need for influenza vaccination  -  Flu vaccine HIGH DOSE PF (Fluzone High dose)   [name], NP

## 2017-03-03 NOTE — Patient Instructions (Addendum)
It was great seeing you, as always   Please work on life style modifications to help lose weight, we need to get you back down to around 200 pounds   I will follow up with you regarding your blood work  Please let me know if you need anything    [name] , Thank you for taking time to come for your Medicare Wellness Visit. I appreciate your ongoing commitment to your health goals. Please review the following plan we discussed and let me know if I can assist you in the future.   Educated regarding prediabetes and numbers;  A1c ranges from 5.8 to 6.5 or fasting Blood sugar > 115 -126; (126 is diabetic)   Risk: >45yo; family hx; overweight or obese; African American; Hispanic; Latino; American [place] American; [place]; history of diabetes when pregnant; or birth to a baby weighing over 9 lbs. Being less physically active than 30 minutes; 3 times a week;   Prevention; Losing a modest 7 to 8 lbs; If over 200 lbs; 10 to 14 lbs;  Choose healthier foods; colorful veggies; fish or lean meats; drinks water Reduce portion size Start exercising; 30 minutes of fast walking x 30 minutes per day/ 60 min for weight loss  Visit the diabetes.org        These are the goals we discussed: Goals    . Weight (lb) < 200 lb (90.7 kg)          Go talk to your friend and buddy up to lose weight        This is a list of the screening recommended for you and due dates:  Health Maintenance  Topic Date Due  .  Hepatitis C: One time screening is recommended by Center for Disease Control  (CDC) for  adults born from [birth year] through 1966.   1950-11-24  . Flu Shot  12/16/2016  . Complete foot exam   02/15/2018*  . Hemoglobin A1C  03/03/2018*  . Eye exam for diabetics  03/09/2018*  . Mammogram  01/06/2019  . Colon Cancer Screening  02/25/2023  . Tetanus Vaccine  09/14/2023  . DEXA scan (bone density measurement)  Completed  . Pneumonia vaccines  Completed  *Topic was postponed. The date  shown is not the original due date.   Fall Prevention in the Home Falls can cause injuries and can affect people from all age groups. There are many simple things that you can do to make your home safe and to help prevent falls. What can I do on the outside of my home?  Regularly repair the edges of walkways and driveways and fix any cracks.  Remove high doorway thresholds.  Trim any shrubbery on the main path into your home.  Use bright outdoor lighting.  Clear walkways of debris and clutter, including tools and rocks.  Regularly check that handrails are securely fastened and in good repair. Both sides of any steps should have handrails.  Install guardrails along the edges of any raised decks or porches.  Have leaves, snow, and ice cleared regularly.  Use sand or salt on walkways during winter months.  In the garage, clean up any spills right away, including grease or oil spills. What can I do in the bathroom?  Use night lights.  Install grab bars by the toilet and in the tub and shower. Do not use towel bars as grab bars.  Use non-skid mats or decals on the floor of the tub or shower.  If  you need to sit down while you are in the shower, use a plastic, non-slip stool.  Keep the floor dry. Immediately clean up any water that spills on the floor.  Remove soap buildup in the tub or shower on a regular basis.  Attach bath mats securely with double-sided non-slip rug tape.  Remove throw rugs and other tripping hazards from the floor. What can I do in the bedroom?  Use night lights.  Make sure that a bedside light is easy to reach.  Do not use oversized bedding that drapes onto the floor.  Have a firm chair that has side arms to use for getting dressed.  Remove throw rugs and other tripping hazards from the floor. What can I do in the kitchen?  Clean up any spills right away.  Avoid walking on wet floors.  Place frequently used items in easy-to-reach  places.  If you need to reach for something above you, use a sturdy step stool that has a grab bar.  Keep electrical cables out of the way.  Do not use floor polish or wax that makes floors slippery. If you have to use wax, make sure that it is non-skid floor wax.  Remove throw rugs and other tripping hazards from the floor. What can I do in the stairways?  Do not leave any items on the stairs.  Make sure that there are handrails on both sides of the stairs. Fix handrails that are broken or loose. Make sure that handrails are as long as the stairways.  Check any carpeting to make sure that it is firmly attached to the stairs. Fix any carpet that is loose or worn.  Avoid having throw rugs at the top or bottom of stairways, or secure the rugs with carpet tape to prevent them from moving.  Make sure that you have a light switch at the top of the stairs and the bottom of the stairs. If you do not have them, have them installed. What are some other fall prevention tips?  Wear closed-toe shoes that fit well and support your feet. Wear shoes that have rubber soles or low heels.  When you use a stepladder, make sure that it is completely opened and that the sides are firmly locked. Have someone hold the ladder while you are using it. Do not climb a closed stepladder.  Add color or contrast paint or tape to grab bars and handrails in your home. Place contrasting color strips on the first and last steps.  Use mobility aids as needed, such as canes, walkers, scooters, and crutches.  Turn on lights if it is dark. Replace any light bulbs that burn out.  Set up furniture so that there are clear paths. Keep the furniture in the same spot.  Fix any uneven floor surfaces.  Choose a carpet design that does not hide the edge of steps of a stairway.  Be aware of any and all pets.  Review your medicines with your healthcare provider. Some medicines can cause dizziness or changes in blood pressure,  which increase your risk of falling. Talk with your health care provider about other ways that you can decrease your risk of falls. This may include working with a physical therapist or trainer to improve your strength, balance, and endurance. This information is not intended to replace advice given to you by your health care provider. Make sure you discuss any questions you have with your health care provider. Document Released: 04/24/2002 Document Revised: 10/01/2015 Document Reviewed:  06/08/2014 Elsevier Interactive Patient Education  2017 [employer] Maintenance, Female Adopting a healthy lifestyle and getting preventive care can go a long way to promote health and wellness. Talk with your health care provider about what schedule of regular examinations is right for you. This is a good chance for you to check in with your provider about disease prevention and staying healthy. In between checkups, there are plenty of things you can do on your own. Experts have done a lot of research about which lifestyle changes and preventive measures are most likely to keep you healthy. Ask your health care provider for more information. Weight and diet Eat a healthy diet  Be sure to include plenty of vegetables, fruits, low-fat dairy products, and lean protein.  Do not eat a lot of foods high in solid fats, added sugars, or salt.  Get regular exercise. This is one of the most important things you can do for your health. ? Most adults should exercise for at least 150 minutes each week. The exercise should increase your heart rate and make you sweat (moderate-intensity exercise). ? Most adults should also do strengthening exercises at least twice a week. This is in addition to the moderate-intensity exercise.  Maintain a healthy weight  Body mass index (BMI) is a measurement that can be used to identify possible weight problems. It estimates body fat based on height and weight. Your health care  provider can help determine your BMI and help you achieve or maintain a healthy weight.  For females 15 years of age and older: ? A BMI below 18.5 is considered underweight. ? A BMI of 18.5 to 24.9 is normal. ? A BMI of 25 to 29.9 is considered overweight. ? A BMI of 30 and above is considered obese.  Watch levels of cholesterol and blood lipids  You should start having your blood tested for lipids and cholesterol at 67 years of age, then have this test every 5 years.  You may need to have your cholesterol levels checked more often if: ? Your lipid or cholesterol levels are high. ? You are older than 67 years of age. ? You are at high risk for heart disease.  Cancer screening Lung Cancer  Lung cancer screening is recommended for adults 59-49 years old who are at high risk for lung cancer because of a history of smoking.  A yearly low-dose CT scan of the lungs is recommended for people who: ? Currently smoke. ? Have quit within the past 15 years. ? Have at least a 30-pack-year history of smoking. A pack year is smoking an average of one pack of cigarettes a day for 1 year.  Yearly screening should continue until it has been 15 years since you quit.  Yearly screening should stop if you develop a health problem that would prevent you from having lung cancer treatment.  Breast Cancer  Practice breast self-awareness. This means understanding how your breasts normally appear and feel.  It also means doing regular breast self-exams. Let your health care provider know about any changes, no matter how small.  If you are in your 20s or 30s, you should have a clinical breast exam (CBE) by a health care provider every 1-3 years as part of a regular health exam.  If you are 31 or older, have a CBE every year. Also consider having a breast X-ray (mammogram) every year.  If you have a family history of breast cancer, talk to your health care  provider about genetic screening.  If you are  at high risk for breast cancer, talk to your health care provider about having an MRI and a mammogram every year.  Breast cancer gene (BRCA) assessment is recommended for women who have family members with BRCA-related cancers. BRCA-related cancers include: ? Breast. ? Ovarian. ? Tubal. ? Peritoneal cancers.  Results of the assessment will determine the need for genetic counseling and BRCA1 and BRCA2 testing.  Cervical Cancer Your health care provider may recommend that you be screened regularly for cancer of the pelvic organs (ovaries, uterus, and vagina). This screening involves a pelvic examination, including checking for microscopic changes to the surface of your cervix (Pap test). You may be encouraged to have this screening done every 3 years, beginning at age 80.  For women ages 56-65, health care providers may recommend pelvic exams and Pap testing every 3 years, or they may recommend the Pap and pelvic exam, combined with testing for human papilloma virus (HPV), every 5 years. Some types of HPV increase your risk of cervical cancer. Testing for HPV may also be done on women of any age with unclear Pap test results.  Other health care providers may not recommend any screening for nonpregnant women who are considered low risk for pelvic cancer and who do not have symptoms. Ask your health care provider if a screening pelvic exam is right for you.  If you have had past treatment for cervical cancer or a condition that could lead to cancer, you need Pap tests and screening for cancer for at least 20 years after your treatment. If Pap tests have been discontinued, your risk factors (such as having a new sexual partner) need to be reassessed to determine if screening should resume. Some women have medical problems that increase the chance of getting cervical cancer. In these cases, your health care provider may recommend more frequent screening and Pap tests.  Colorectal Cancer  This type of  cancer can be detected and often prevented.  Routine colorectal cancer screening usually begins at 67 years of age and continues through 67 years of age.  Your health care provider may recommend screening at an earlier age if you have risk factors for colon cancer.  Your health care provider may also recommend using home test kits to check for hidden blood in the stool.  A small camera at the end of a tube can be used to examine your colon directly (sigmoidoscopy or colonoscopy). This is done to check for the earliest forms of colorectal cancer.  Routine screening usually begins at age 53.  Direct examination of the colon should be repeated every 5-10 years through 67 years of age. However, you may need to be screened more often if early forms of precancerous polyps or small growths are found.  Skin Cancer  Check your skin from head to toe regularly.  Tell your health care provider about any new moles or changes in moles, especially if there is a change in a mole's shape or color.  Also tell your health care provider if you have a mole that is larger than the size of a pencil eraser.  Always use sunscreen. Apply sunscreen liberally and repeatedly throughout the day.  Protect yourself by wearing long sleeves, pants, a wide-brimmed hat, and sunglasses whenever you are outside.  Heart disease, diabetes, and high blood pressure  High blood pressure causes heart disease and increases the risk of stroke. High blood pressure is more likely to develop in: ?  People who have blood pressure in the high end of the normal range (130-139/85-89 mm Hg). ? People who are overweight or obese. ? People who are African American.  If you are 68-68 years of age, have your blood pressure checked every 3-5 years. If you are 37 years of age or older, have your blood pressure checked every year. You should have your blood pressure measured twice-once when you are at a hospital or clinic, and once when you are  not at a hospital or clinic. Record the average of the two measurements. To check your blood pressure when you are not at a hospital or clinic, you can use: ? An automated blood pressure machine at a pharmacy. ? A home blood pressure monitor.  If you are between 17 years and 20 years old, ask your health care provider if you should take aspirin to prevent strokes.  Have regular diabetes screenings. This involves taking a blood sample to check your fasting blood sugar level. ? If you are at a normal weight and have a low risk for diabetes, have this test once every three years after 67 years of age. ? If you are overweight and have a high risk for diabetes, consider being tested at a younger age or more often. Preventing infection Hepatitis B  If you have a higher risk for hepatitis B, you should be screened for this virus. You are considered at high risk for hepatitis B if: ? You were born in a country where hepatitis B is common. Ask your health care provider which countries are considered high risk. ? Your parents were born in a high-risk country, and you have not been immunized against hepatitis B (hepatitis B vaccine). ? You have HIV or AIDS. ? You use needles to inject street drugs. ? You live with someone who has hepatitis B. ? You have had sex with someone who has hepatitis B. ? You get hemodialysis treatment. ? You take certain medicines for conditions, including cancer, organ transplantation, and autoimmune conditions.  Hepatitis C  Blood testing is recommended for: ? Everyone born from [birth year] through 1966. ? Anyone with known risk factors for hepatitis C.  Sexually transmitted infections (STIs)  You should be screened for sexually transmitted infections (STIs) including gonorrhea and chlamydia if: ? You are sexually active and are younger than 67 years of age. ? You are older than 67 years of age and your health care provider tells you that you are at risk for this type of  infection. ? Your sexual activity has changed since you were last screened and you are at an increased risk for chlamydia or gonorrhea. Ask your health care provider if you are at risk.  If you do not have HIV, but are at risk, it may be recommended that you take a prescription medicine daily to prevent HIV infection. This is called pre-exposure prophylaxis (PrEP). You are considered at risk if: ? You are sexually active and do not regularly use condoms or know the HIV status of your partner(s). ? You take drugs by injection. ? You are sexually active with a partner who has HIV.  Talk with your health care provider about whether you are at high risk of being infected with HIV. If you choose to begin PrEP, you should first be tested for HIV. You should then be tested every 3 months for as long as you are taking PrEP. Pregnancy  If you are premenopausal and you may become pregnant, ask your health  care provider about preconception counseling.  If you may become pregnant, take 400 to 800 micrograms (mcg) of folic acid every day.  If you want to prevent pregnancy, talk to your health care provider about birth control (contraception). Osteoporosis and menopause  Osteoporosis is a disease in which the bones lose minerals and strength with aging. This can result in serious bone fractures. Your risk for osteoporosis can be identified using a bone density scan.  If you are 9 years of age or older, or if you are at risk for osteoporosis and fractures, ask your health care provider if you should be screened.  Ask your health care provider whether you should take a calcium or vitamin D supplement to lower your risk for osteoporosis.  Menopause may have certain physical symptoms and risks.  Hormone replacement therapy may reduce some of these symptoms and risks. Talk to your health care provider about whether hormone replacement therapy is right for you. Follow these instructions at home:  Schedule  regular health, dental, and eye exams.  Stay current with your immunizations.  Do not use any tobacco products including cigarettes, chewing tobacco, or electronic cigarettes.  If you are pregnant, do not drink alcohol.  If you are breastfeeding, limit how much and how often you drink alcohol.  Limit alcohol intake to no more than 1 drink per day for nonpregnant women. One drink equals 12 ounces of beer, 5 ounces of wine, or 1 ounces of hard liquor.  Do not use street drugs.  Do not share needles.  Ask your health care provider for help if you need support or information about quitting drugs.  Tell your health care provider if you often feel depressed.  Tell your health care provider if you have ever been abused or do not feel safe at home. This information is not intended to replace advice given to you by your health care provider. Make sure you discuss any questions you have with your health care provider. Document Released: 11/17/2010 Document Revised: 10/10/2015 Document Reviewed: 02/05/2015 Elsevier Interactive Patient Education  [employer].

## 2017-03-04 LAB — HEPATITIS C ANTIBODY
Hepatitis C Ab: NONREACTIVE
SIGNAL TO CUT-OFF: 0.03 (ref <1.00)

## 2017-05-02 ENCOUNTER — Other Ambulatory Visit: Payer: Self-pay | Admitting: Adult Health

## 2017-05-02 DIAGNOSIS — I10 Essential (primary) hypertension: Secondary | ICD-10-CM

## 2017-05-03 NOTE — Telephone Encounter (Signed)
Sent to the pharmacy by e-scribe. 

## 2017-11-15 ENCOUNTER — Ambulatory Visit: Payer: Medicare Other | Admitting: Adult Health

## 2017-11-15 ENCOUNTER — Encounter: Payer: Self-pay | Admitting: Adult Health

## 2017-11-15 DIAGNOSIS — G4733 Obstructive sleep apnea (adult) (pediatric): Secondary | ICD-10-CM

## 2017-11-15 NOTE — Patient Instructions (Signed)
Keep up the good work Continue on CPAP at bedtime Work on healthy weight Do not drive sleeping Follow-up in 1 year with Dr. Elsworth Soho  Or Ruthellen Tippy NP and As needed

## 2017-11-15 NOTE — Assessment & Plan Note (Signed)
Wt loss  

## 2017-11-15 NOTE — Progress Notes (Signed)
@Patient  ID: Kylie Davis, female    DOB: [birth year], 67 y.o.   MRN: [id number]  Chief Complaint  Patient presents with  . Follow-up    OSA     Referring provider: [name], NP  HPI: 67 year old female followed for obstructive sleep apnea  TEST  HST 10/2013:  AHI 25/hr.   11/15/2017 Follow up ; OSA  Pt returns for yearly follow up for sleep apnea.  Patient says overall she is doing well on CPAP.  She tries to wear it each night.  Does miss a few nights intermittently.  When she does wear she feels rested with no significant daytime sleepiness.  Download shows 63% usage.  Average daily usage at 6.5 hours.  Patient is on auto CPAP 5 to 20 cm H2O.  AHI 3.9.  Minimal leaks.   No Known Allergies  Immunization History  Administered Date(s) Administered  . Influenza Split 02/25/2012, 03/18/2013, 02/02/2014  . Influenza, High Dose Seasonal PF 02/28/2015, 02/20/2016, 03/03/2017  . Pneumococcal Conjugate-13 02/28/2015  . Pneumococcal Polysaccharide-23 02/20/2016  . Td 05/19/2003  . Tdap 09/13/2013  . Zoster 12/15/2010    Past Medical History:  Diagnosis Date  . Hypertension   . Sleep apnea    C PAP    Tobacco History: Social History   Tobacco Use  Smoking Status Never Smoker  Smokeless Tobacco Never Used   Counseling given: Not Answered   Outpatient Encounter Medications as of 11/15/2017  Medication Sig  . aspirin 81 MG tablet Take 81 mg by mouth daily.    . calcium-vitamin D (SM CALCIUM 500/VITAMIN D3) 500-400 MG-UNIT per tablet Take 1 tablet by mouth daily.   Marland Kitchen losartan-hydrochlorothiazide (HYZAAR) 50-12.5 MG tablet TAKE 1 TABLET BY MOUTH EVERY MORNING  . multivitamin (THERAGRAN) per tablet Take 1 tablet by mouth daily.     No facility-administered encounter medications on file as of 11/15/2017.      Review of Systems  Constitutional:   No  weight loss, night sweats,  Fevers, chills, fatigue, or  lassitude.  HEENT:   No headaches,  Difficulty swallowing,   Tooth/dental problems, or  Sore throat,                No sneezing, itching, ear ache, nasal congestion, post nasal drip,   CV:  No chest pain,  Orthopnea, PND, swelling in lower extremities, anasarca, dizziness, palpitations, syncope.   GI  No heartburn, indigestion, abdominal pain, nausea, vomiting, diarrhea, change in bowel habits, loss of appetite, bloody stools.   Resp: No shortness of breath with exertion or at rest.  No excess mucus, no productive cough,  No non-productive cough,  No coughing up of blood.  No change in color of mucus.  No wheezing.  No chest wall deformity  Skin: no rash or lesions.  GU: no dysuria, change in color of urine, no urgency or frequency.  No flank pain, no hematuria   MS:  No joint pain or swelling.  No decreased range of motion.  No back pain.    Physical Exam  BP 118/70 (BP Location: Left Arm, Cuff Size: Normal)   Pulse 68   Ht 5\' 4"  (1.626 m)   Wt 206 lb (93.4 kg)   SpO2 96%   BMI 35.36 kg/m   GEN: A/Ox3; pleasant , NAD, obese    HEENT:  [place]/AT,  EACs-clear, TMs-wnl, NOSE-clear, THROAT-clear, no lesions, no postnasal drip or exudate noted. Class 2 MP airway   NECK:  Supple w/ fair ROM;  no JVD; normal carotid impulses w/o bruits; no thyromegaly or nodules palpated; no lymphadenopathy.    RESP  Clear  P & A; w/o, wheezes/ rales/ or rhonchi. no accessory muscle use, no dullness to percussion  CARD:  RRR, no m/r/g, no peripheral edema, pulses intact, no cyanosis or clubbing.  GI:   Soft & nt; nml bowel sounds; no organomegaly or masses detected.   Musco: Warm bil, no deformities or joint swelling noted.   Neuro: alert, no focal deficits noted.    Skin: Warm, no lesions or rashes    Lab Results:  CBC  BMET  BNP No results found for: BNP  ProBNP No results found for: PROBNP  Imaging: No results found.   Assessment & Plan:   OSA (obstructive sleep apnea) Well-controlled on CPAP.  Patient encouraged on daily  compliance  Plan  Patient Instructions  Keep up the good work Continue on CPAP at bedtime Work on healthy weight Do not drive sleeping Follow-up in 1 year with Dr. Elsworth Soho  Or Parrett NP and As needed       Obesity Wt loss      Rexene Edison, NP 11/15/2017

## 2017-11-15 NOTE — Assessment & Plan Note (Signed)
Well-controlled on CPAP.  Patient encouraged on daily compliance  Plan  Patient Instructions  Keep up the good work Continue on CPAP at bedtime Work on healthy weight Do not drive sleeping Follow-up in 1 year with Dr. Elsworth Soho  Or Milaya Hora NP and As needed

## 2017-11-16 NOTE — Addendum Note (Signed)
Addended by: Parke Poisson E on: 11/16/2017 12:50 PM   Modules accepted: Orders

## 2017-11-23 ENCOUNTER — Encounter: Payer: Self-pay | Admitting: Adult Health

## 2017-11-24 ENCOUNTER — Telehealth: Payer: Self-pay | Admitting: *Deleted

## 2017-11-24 NOTE — Telephone Encounter (Signed)
Kylie Davis, see message from pt.  Last A1C 6.2.  Please advise.

## 2017-11-24 NOTE — Telephone Encounter (Signed)
She will need an office visit for insurance to pay for it

## 2017-11-24 NOTE — Telephone Encounter (Signed)
Copied from [place] [facility] [id number]. Topic: Inquiry >> Nov 24, 2017  9:33 AM Pricilla Handler wrote: Reason for CRM: Patient wants Tommi Rumps to place an order for her A1C Lab. Patient does not want to come in to see Tommi Rumps if it is not nessessary. Patient wants to come in to have the A1C Lab only. Please call patient this morning to let her know if she needs an appt or can have the lab only.       Thank You!!!

## 2017-11-24 NOTE — Telephone Encounter (Signed)
Please advise. Result notes from October 2018 say patient to return in 6 months for A1c, but I'm unclear if this is supposed to be OV or lab visit. Thanks!

## 2017-11-25 NOTE — Telephone Encounter (Signed)
Called and left a message for pt to call back and schedule appt with [facility].  Pt does need appt.

## 2017-11-30 NOTE — Telephone Encounter (Signed)
Pt scheduled for 12/01/17 with Tommi Rumps.  No further action required.

## 2017-12-01 ENCOUNTER — Ambulatory Visit: Payer: Medicare Other | Admitting: Adult Health

## 2017-12-01 ENCOUNTER — Encounter: Payer: Self-pay | Admitting: Adult Health

## 2017-12-01 DIAGNOSIS — R7303 Prediabetes: Secondary | ICD-10-CM | POA: Diagnosis not present

## 2017-12-01 LAB — POCT GLYCOSYLATED HEMOGLOBIN (HGB A1C): HBA1C, POC (CONTROLLED DIABETIC RANGE): 5.4 % (ref 0.0–7.0)

## 2017-12-01 NOTE — Progress Notes (Signed)
Subjective:    Patient ID: Kylie Davis, female    DOB: 1951-04-26, 67 y.o.   MRN: [id number]  HPI  67 year old female who  has a past medical history of Hypertension and Sleep apnea. She presents to the office today for follow up regarding hyperglycemia. Her A1c was last checked 8 months ago and had increased from 5.7 to 6.2. She was advised to work on lifestyle modifications   Today in the office she reports that she has been working on lifestyle modifications and has been able to lose 26 pounds since October. She reports that she feels much better since she started losing weight.   Wt Readings from Last 3 Encounters:  12/01/17 205 lb (93 kg)  11/15/17 206 lb (93.4 kg)  03/03/17 231 lb (104.8 kg)     Review of Systems See HPI   Past Medical History:  Diagnosis Date  . Hypertension   . Sleep apnea    C PAP    Social History   Socioeconomic History  . Marital status: Married    Spouse name: Not on file  . Number of children: Not on file  . Years of education: Not on file  . Highest education level: Not on file  Occupational History  . Occupation: retired Product manager: RETIRED  Social Needs  . Financial resource strain: Not on file  . Food insecurity:    Worry: Not on file    Inability: Not on file  . Transportation needs:    Medical: Not on file    Non-medical: Not on file  Tobacco Use  . Smoking status: Never Smoker  . Smokeless tobacco: Never Used  Substance and Sexual Activity  . Alcohol use: Yes    Alcohol/week: 2.4 oz    Types: 4 Standard drinks or equivalent per week    Comment: weekly  . Drug use: No  . Sexual activity: Yes    Birth control/protection: Post-menopausal    Comment: 1st intercourse 67 yo.---Fewer than 5 partners  Lifestyle  . Physical activity:    Days per week: Not on file    Minutes per session: Not on file  . Stress: Not on file  Relationships  . Social connections:    Talks on phone: Not on file    Gets together:  Not on file    Attends religious service: Not on file    Active member of club or organization: Not on file    Attends meetings of clubs or organizations: Not on file    Relationship status: Not on file  . Intimate partner violence:    Fear of current or ex partner: Not on file    Emotionally abused: Not on file    Physically abused: Not on file    Forced sexual activity: Not on file  Other Topics Concern  . Not on file  Social History Narrative   Retired from being a Education officer, museum.    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 years    0 children     Past Surgical History:  Procedure Laterality Date  . CHOLECYSTECTOMY  2004    Family History  Problem Relation Age of Onset  . Cancer Mother 40       primary peritoneal cancer  . Alzheimer's disease Mother   . Cancer Father 43       stomach or pancreatic  . Heart disease Father   . Hypertension Brother   . Heart attack Brother 48  MI x 2  . Diabetes Paternal Grandmother   . Breast cancer Paternal Aunt 72  . Colon cancer Neg Hx     No Known Allergies  Current Outpatient Medications on File Prior to Visit  Medication Sig Dispense Refill  . aspirin 81 MG tablet Take 81 mg by mouth daily.      . calcium-vitamin D (SM CALCIUM 500/VITAMIN D3) 500-400 MG-UNIT per tablet Take 1 tablet by mouth daily.     Marland Kitchen losartan-hydrochlorothiazide (HYZAAR) 50-12.5 MG tablet TAKE 1 TABLET BY MOUTH EVERY MORNING 90 tablet 2  . multivitamin (THERAGRAN) per tablet Take 1 tablet by mouth daily.       No current facility-administered medications on file prior to visit.     BP 120/80   Temp 98.4 F (36.9 C) (Oral)   Wt 205 lb (93 kg)   BMI 35.19 kg/m       Objective:   Physical Exam  Constitutional: She is oriented to person, place, and time. She appears well-developed and well-nourished. No distress.  Cardiovascular: Normal rate, regular rhythm, normal heart sounds and intact distal pulses. Exam reveals no gallop and no friction rub.  No murmur  heard. Pulmonary/Chest: Effort normal and breath sounds normal. No stridor. No respiratory distress. She has no wheezes. She has no rales. She exhibits no tenderness.  Neurological: She is alert and oriented to person, place, and time.  Skin: Skin is warm and dry. She is not diaphoretic.  Psychiatric: She has a normal mood and affect. Her behavior is normal. Judgment and thought content normal.  Nursing note and vitals reviewed.     Assessment & Plan:  1. Pre-diabetes - POC HgB A1c- 5.4  - Continue to work on diet and exercise in order to obtain a healthy weight  - Will follow up with at [facility] in October   [name], NP

## 2017-12-10 ENCOUNTER — Other Ambulatory Visit: Payer: Self-pay | Admitting: Gynecology

## 2017-12-10 DIAGNOSIS — Z1231 Encounter for screening mammogram for malignant neoplasm of breast: Secondary | ICD-10-CM

## 2018-01-11 ENCOUNTER — Ambulatory Visit
Admission: RE | Admit: 2018-01-11 | Discharge: 2018-01-11 | Disposition: A | Discharge status: Home / Self Care | Payer: Medicare Other | Source: Ambulatory Visit | Attending: Gynecology | Admitting: Gynecology

## 2018-01-11 DIAGNOSIS — Z1231 Encounter for screening mammogram for malignant neoplasm of breast: Secondary | ICD-10-CM

## 2018-01-12 ENCOUNTER — Ambulatory Visit: Payer: Medicare Other | Admitting: Family Medicine

## 2018-01-12 ENCOUNTER — Ambulatory Visit: Payer: Self-pay | Admitting: Adult Health

## 2018-01-12 ENCOUNTER — Encounter: Payer: Self-pay | Admitting: Family Medicine

## 2018-01-12 VITALS — BP 134/80 | HR 78 | Temp 98.4°F | Wt 203.0 lb

## 2018-01-12 DIAGNOSIS — L03116 Cellulitis of left lower limb: Secondary | ICD-10-CM

## 2018-01-12 MED ORDER — CEPHALEXIN 500 MG PO CAPS: 500.0000 mg | ORAL_CAPSULE | Freq: Four times a day (QID) | ORAL | 0 refills | Status: DC

## 2018-01-12 NOTE — Telephone Encounter (Signed)
Pt c/o left lower leg redness and wamth that encircles the lower leg just above the ankle. Pt stated the redness and swelling is warm to touch. Pt stated the area affected is hot and tingly. Pt denies chest pain or difficulty breathing.  Pt given appointment for 0830 with Dr Sarajane Jews. Pt assured NT that she can make the appointment on time.  Reason for Disposition . [1] Red area or streak [2] large (> 2 in. or 5 cm)    Pt given early appointment  Answer Assessment - Initial Assessment Questions 1. ONSET: "When did the swelling start?" (e.g., minutes, hours, days)     Last night 8:00 pm 2. LOCATION: "What part of the leg is swollen?"  "Are both legs swollen or just one leg?"     Lower left leg above ankle 3. SEVERITY: "How bad is the swelling?" (e.g., localized; mild, moderate, severe)  - Localized - small area of swelling localized to one leg  - MILD pedal edema - swelling limited to foot and ankle, pitting edema < 1/4 inch (6 mm) deep, rest and elevation eliminate most or all swelling  - MODERATE edema - swelling of lower leg to knee, pitting edema > 1/4 inch (6 mm) deep, rest and elevation only partially reduce swelling  - SEVERE edema - swelling extends above knee, facial or hand swelling present      moderate 4. REDNESS: "Does the swelling look red or infected?"     Yes red hot and itchy and tingly 5. PAIN: "Is the swelling painful to touch?" If so, ask: "How painful is it?"   (Scale 1-10; mild, moderate or severe)     no 6. FEVER: "Do you have a fever?" If so, ask: "What is it, how was it measured, and when did it start?"      no 7. CAUSE: "What do you think is causing the leg swelling?"     Pt doesn't know 8. MEDICAL HISTORY: "Do you have a history of heart failure, kidney disease, liver failure, or cancer?"     no 9. RECURRENT SYMPTOM: "Have you had leg swelling before?" If so, ask: "When was the last time?" "What happened that time?"     no 10. OTHER SYMPTOMS: "Do you have any other  symptoms?" (e.g., chest pain, difficulty breathing)       No  11. PREGNANCY: "Is there any chance you are pregnant?" "When was your last menstrual period?"       n/a  Protocols used: LEG SWELLING AND EDEMA-A-AH

## 2018-01-12 NOTE — Progress Notes (Signed)
   Subjective:    Patient ID: Kylie Davis, female    DOB: 05-Mar-1951, 67 y.o.   MRN: [id number]  HPI Here for the onset last night of redness and mild pain in the left lower leg. No fever. She has never had this before. Her last A1c on 12-01-17 was 5.4.    Review of Systems  Constitutional: Negative.   Respiratory: Negative.   Cardiovascular: Negative.   Skin: Positive for color change.       Objective:   Physical Exam  Constitutional: She appears well-developed and well-nourished.  Cardiovascular: Normal rate, regular rhythm, normal heart sounds and intact distal pulses.  Pulmonary/Chest: Effort normal and breath sounds normal.  Musculoskeletal:  Trace edema in the lower legs   Skin:  Left lower leg has a band of erythema all around the leg with warmth and tenderness. No breaks in the skin are seen           Assessment & Plan:  Cellulitis, treat with Keflex. She will follow up in 2 days with her PCP. Alysia Penna, MD

## 2018-01-12 NOTE — Telephone Encounter (Signed)
Patient has arrived here in the office now to see Dr. Sarajane Jews.

## 2018-01-14 ENCOUNTER — Encounter: Payer: Self-pay | Admitting: Adult Health

## 2018-01-14 ENCOUNTER — Ambulatory Visit: Payer: Medicare Other | Admitting: Adult Health

## 2018-01-14 VITALS — BP 130/74 | Temp 98.7°F | Wt 201.0 lb

## 2018-01-14 DIAGNOSIS — L03116 Cellulitis of left lower limb: Secondary | ICD-10-CM | POA: Diagnosis not present

## 2018-01-14 NOTE — Progress Notes (Signed)
Subjective:    Patient ID: Kylie Davis, female    DOB: 10/24/1950, 67 y.o.   MRN: [id number]  HPI  67 year old female who  has a past medical history of Hypertension and Sleep apnea.  She presents to the office for follow up after being seen two days ago for redness and mild pain on left lower leg. She was seen by another provider who prescribed Keflex for cellulitis.   She has been taking her medication as directed. She reports that she has noticed improvement since starting abx. She feels as though the area is less warm and the redness has started to retreat.   Review of Systems See HPI   Past Medical History:  Diagnosis Date  . Hypertension   . Sleep apnea    C PAP    Social History   Socioeconomic History  . Marital status: Married    Spouse name: Not on file  . Number of children: Not on file  . Years of education: Not on file  . Highest education level: Not on file  Occupational History  . Occupation: retired Product manager: RETIRED  Social Needs  . Financial resource strain: Not on file  . Food insecurity:    Worry: Not on file    Inability: Not on file  . Transportation needs:    Medical: Not on file    Non-medical: Not on file  Tobacco Use  . Smoking status: Never Smoker  . Smokeless tobacco: Never Used  Substance and Sexual Activity  . Alcohol use: Yes    Alcohol/week: 4.0 standard drinks    Types: 4 Standard drinks or equivalent per week    Comment: weekly  . Drug use: No  . Sexual activity: Yes    Birth control/protection: Post-menopausal    Comment: 1st intercourse 67 yo.---Fewer than 5 partners  Lifestyle  . Physical activity:    Days per week: Not on file    Minutes per session: Not on file  . Stress: Not on file  Relationships  . Social connections:    Talks on phone: Not on file    Gets together: Not on file    Attends religious service: Not on file    Active member of club or organization: Not on file    Attends meetings of  clubs or organizations: Not on file    Relationship status: Not on file  . Intimate partner violence:    Fear of current or ex partner: Not on file    Emotionally abused: Not on file    Physically abused: Not on file    Forced sexual activity: Not on file  Other Topics Concern  . Not on file  Social History Narrative   Retired from being a Education officer, museum.    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 years    0 children     Past Surgical History:  Procedure Laterality Date  . CHOLECYSTECTOMY  2004    Family History  Problem Relation Age of Onset  . Cancer Mother 64       primary peritoneal cancer  . Alzheimer's disease Mother   . Cancer Father 50       stomach or pancreatic  . Heart disease Father   . Hypertension Brother   . Heart attack Brother 57       MI x 2  . Diabetes Paternal Grandmother   . Breast cancer Paternal Aunt 72  . Colon cancer Neg Hx  No Known Allergies  Current Outpatient Medications on File Prior to Visit  Medication Sig Dispense Refill  . aspirin 81 MG tablet Take 81 mg by mouth daily.      . calcium-vitamin D (SM CALCIUM 500/VITAMIN D3) 500-400 MG-UNIT per tablet Take 1 tablet by mouth daily.     . cephALEXin (KEFLEX) 500 MG capsule Take 1 capsule (500 mg total) by mouth 4 (four) times daily. 40 capsule 0  . losartan-hydrochlorothiazide (HYZAAR) 50-12.5 MG tablet TAKE 1 TABLET BY MOUTH EVERY MORNING 90 tablet 2  . multivitamin (THERAGRAN) per tablet Take 1 tablet by mouth daily.       No current facility-administered medications on file prior to visit.     BP 130/74   Temp 98.7 F (37.1 C) (Oral)   Wt 201 lb (91.2 kg)   BMI 34.50 kg/m       Objective:   Physical Exam  Constitutional: She is oriented to person, place, and time. She appears well-developed and well-nourished. No distress.  Musculoskeletal: Normal range of motion. She exhibits tenderness (trace edema in lower legs ).  Neurological: She is alert and oriented to person, place, and time.  Skin:  She is not diaphoretic. There is erythema.  Redness noted on left lower leg has band like appearance. Appears to be fading. Area no longer warm or painful to touch   Psychiatric: She has a normal mood and affect. Her behavior is normal. Judgment and thought content normal.  Nursing note and vitals reviewed.     Assessment & Plan:  1. Cellulitis of left lower extremity - Seems to be improving. Will have her continue abx and follow up in one week   [name], NP

## 2018-01-15 ENCOUNTER — Encounter: Payer: Self-pay | Admitting: Adult Health

## 2018-01-18 NOTE — Telephone Encounter (Signed)
Copied from [place] [id number]. Topic: Quick Communication - Rx Refill/Question >> Jan 18, 2018 11:55 AM Antonieta Iba C wrote: Medication: cephALEXin (KEFLEX) 500 MG capsule. Pt would also like to know if provider could supply her with a few days of her BP medication also? losartan-hydrochlorothiazide (HYZAAR) 50-12.5 MG tablet   Has the patient contacted their pharmacy? No  (Agent: If no, request that the patient contact the pharmacy for the refill.) (Agent: If yes, when and what did the pharmacy advise?)  Preferred Pharmacy (with phone number or street name): [facility] at [address] [place], [place] [address]   Agent: Please be advised that RX refills may take up to 3 business days. We ask that you follow-up with your pharmacy.

## 2018-02-24 ENCOUNTER — Other Ambulatory Visit: Payer: Self-pay | Admitting: Adult Health

## 2018-02-24 DIAGNOSIS — I10 Essential (primary) hypertension: Secondary | ICD-10-CM

## 2018-02-25 NOTE — Telephone Encounter (Signed)
Sent to the pharmacy by e-scribe for 90 days.  Pt is scheduled for cpx on 03/08/18.

## 2018-03-08 ENCOUNTER — Encounter: Payer: Self-pay | Admitting: Adult Health

## 2018-03-08 ENCOUNTER — Ambulatory Visit (INDEPENDENT_AMBULATORY_CARE_PROVIDER_SITE_OTHER): Payer: Medicare Other | Admitting: Adult Health

## 2018-03-08 VITALS — BP 126/80 | Temp 98.4°F | Ht 63.5 in | Wt 207.0 lb

## 2018-03-08 DIAGNOSIS — I10 Essential (primary) hypertension: Secondary | ICD-10-CM

## 2018-03-08 DIAGNOSIS — R7303 Prediabetes: Secondary | ICD-10-CM | POA: Diagnosis not present

## 2018-03-08 DIAGNOSIS — Z0000 Encounter for general adult medical examination without abnormal findings: Secondary | ICD-10-CM | POA: Diagnosis not present

## 2018-03-08 DIAGNOSIS — Z23 Encounter for immunization: Secondary | ICD-10-CM

## 2018-03-08 LAB — BASIC METABOLIC PANEL
BUN: 16 mg/dL (ref 6–23)
CALCIUM: 9.3 mg/dL (ref 8.4–10.5)
CO2: 32 meq/L (ref 19–32)
CREATININE: 0.75 mg/dL (ref 0.40–1.20)
Chloride: 100 mEq/L (ref 96–112)
GFR: 81.56 mL/min (ref >60.00)
GLUCOSE: 107 mg/dL — AB (ref 70–99)
Potassium: 4.6 mEq/L (ref 3.5–5.1)
Sodium: 137 mEq/L (ref 135–145)

## 2018-03-08 LAB — HEPATIC FUNCTION PANEL
ALT: 25 U/L (ref 0–35)
AST: 18 U/L (ref 0–37)
Albumin: 4.4 g/dL (ref 3.5–5.2)
Alkaline Phosphatase: 74 U/L (ref 39–117)
BILIRUBIN DIRECT: 0.1 mg/dL (ref 0.0–0.3)
BILIRUBIN TOTAL: 0.5 mg/dL (ref 0.2–1.2)
Total Protein: 7.6 g/dL (ref 6.0–8.3)

## 2018-03-08 LAB — CBC WITH DIFFERENTIAL/PLATELET
BASOS ABS: 0 10*3/uL (ref 0.0–0.1)
Basophils Relative: 0.6 % (ref 0.0–3.0)
EOS ABS: 0.2 10*3/uL (ref 0.0–0.7)
Eosinophils Relative: 4.1 % (ref 0.0–5.0)
HCT: 41.4 % (ref 36.0–46.0)
Hemoglobin: 13.9 g/dL (ref 12.0–15.0)
LYMPHS ABS: 2.1 10*3/uL (ref 0.7–4.0)
Lymphocytes Relative: 34.8 % (ref 12.0–46.0)
MCHC: 33.5 g/dL (ref 30.0–36.0)
MCV: 88.3 fl (ref 78.0–100.0)
Monocytes Absolute: 0.5 10*3/uL (ref 0.1–1.0)
Monocytes Relative: 8.3 % (ref 3.0–12.0)
NEUTROS ABS: 3.2 10*3/uL (ref 1.4–7.7)
NEUTROS PCT: 52.2 % (ref 43.0–77.0)
PLATELETS: 291 10*3/uL (ref 150.0–400.0)
RBC: 4.68 Mil/uL (ref 3.87–5.11)
RDW: 14.8 % (ref 11.5–15.5)
WBC: 6.1 10*3/uL (ref 4.0–10.5)

## 2018-03-08 LAB — LIPID PANEL
CHOL/HDL RATIO: 3
Cholesterol: 185 mg/dL (ref 0–200)
HDL: 54.2 mg/dL (ref >39.00)
LDL CALC: 111 mg/dL — AB (ref 0–99)
NonHDL: 131.06
TRIGLYCERIDES: 102 mg/dL (ref 0.0–149.0)
VLDL: 20.4 mg/dL (ref 0.0–40.0)

## 2018-03-08 LAB — HEMOGLOBIN A1C: HEMOGLOBIN A1C: 5.9 % (ref 4.6–6.5)

## 2018-03-08 LAB — TSH: TSH: 4.66 u[IU]/mL — ABNORMAL HIGH (ref 0.35–4.50)

## 2018-03-08 MED ORDER — LOSARTAN POTASSIUM-HCTZ 50-12.5 MG PO TABS: 1.0000 | ORAL_TABLET | Freq: Every morning | ORAL | 1 refills | Status: DC

## 2018-03-08 NOTE — Progress Notes (Signed)
Subjective:    Patient ID: Kylie Davis, female    DOB: 01-02-1951, 67 y.o.   MRN: [id number]  HPI Patient presents for yearly preventative medicine examination. She is a pleasant 67 year old female who  has a past medical history of Hypertension and Sleep apnea. She is leaving shortly for the [place], where she and her husband will spend the winter.   Hypertension - Currently controlled with Hyzaar 50-12.5.  She is also been working on lifestyle modifications and has been able to lose a significant amount of weight BP Readings from Last 3 Encounters:  03/08/18 126/80  01/14/18 130/74  01/12/18 134/80   All immunizations and health maintenance protocols were reviewed with the patient and needed orders were placed.  Appropriate screening laboratory values were ordered for the patient including screening of hyperlipidemia, renal function and hepatic function.  Medication reconciliation,  past medical history, social history, problem list and allergies were reviewed in detail with the patient  Goals were established with regard to weight loss, exercise, and  diet in compliance with medications. She has been traveling a lot and helping out a friend getting moved to [place].   Wt Readings from Last 3 Encounters:  03/08/18 207 lb (93.9 kg)  01/14/18 201 lb (91.2 kg)  01/12/18 203 lb (92.1 kg)   End of life planning was discussed.  She is Up to date on routine health maintenance items such as dental and vision screens, routine colonoscopy, and mammogram. She does home breast exams and has not noticed any changes.   She denies any acute issues   Review of Systems  Constitutional: Negative.   HENT: Negative.   Eyes: Negative.   Respiratory: Negative.   Cardiovascular: Negative.   Gastrointestinal: Negative.   Endocrine: Negative.   Genitourinary: Negative.   Musculoskeletal: Negative.   Skin: Negative.   Allergic/Immunologic: Negative.   Neurological: Negative.     Hematological: Negative.   Psychiatric/Behavioral: Negative.      Past Medical History:  Diagnosis Date  . Hypertension   . Sleep apnea    C PAP    Social History   Socioeconomic History  . Marital status: Married    Spouse name: Not on file  . Number of children: Not on file  . Years of education: Not on file  . Highest education level: Not on file  Occupational History  . Occupation: retired Product manager: RETIRED  Social Needs  . Financial resource strain: Not on file  . Food insecurity:    Worry: Not on file    Inability: Not on file  . Transportation needs:    Medical: Not on file    Non-medical: Not on file  Tobacco Use  . Smoking status: Never Smoker  . Smokeless tobacco: Never Used  Substance and Sexual Activity  . Alcohol use: Yes    Alcohol/week: 4.0 standard drinks    Types: 4 Standard drinks or equivalent per week    Comment: weekly  . Drug use: No  . Sexual activity: Yes    Birth control/protection: Post-menopausal    Comment: 1st intercourse 67 yo.---Fewer than 5 partners  Lifestyle  . Physical activity:    Days per week: Not on file    Minutes per session: Not on file  . Stress: Not on file  Relationships  . Social connections:    Talks on phone: Not on file    Gets together: Not on file    Attends religious service:  Not on file    Active member of club or organization: Not on file    Attends meetings of clubs or organizations: Not on file    Relationship status: Not on file  . Intimate partner violence:    Fear of current or ex partner: Not on file    Emotionally abused: Not on file    Physically abused: Not on file    Forced sexual activity: Not on file  Other Topics Concern  . Not on file  Social History Narrative   Retired from being a Education officer, museum.    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 years    0 children     Past Surgical History:  Procedure Laterality Date  . CHOLECYSTECTOMY  2004    Family History  Problem Relation Age of Onset   . Cancer Mother 87       primary peritoneal cancer  . Alzheimer's disease Mother   . Cancer Father 63       stomach or pancreatic  . Heart disease Father   . Hypertension Brother   . Heart attack Brother 40       MI x 2  . Diabetes Paternal Grandmother   . Breast cancer Paternal Aunt 31  . Colon cancer Neg Hx     No Known Allergies  Current Outpatient Medications on File Prior to Visit  Medication Sig Dispense Refill  . aspirin 81 MG tablet Take 81 mg by mouth daily.      . calcium-vitamin D (SM CALCIUM 500/VITAMIN D3) 500-400 MG-UNIT per tablet Take 1 tablet by mouth daily.     . cephALEXin (KEFLEX) 500 MG capsule Take 1 capsule (500 mg total) by mouth 4 (four) times daily. 40 capsule 0  . losartan-hydrochlorothiazide (HYZAAR) 50-12.5 MG tablet TAKE 1 TABLET BY MOUTH EVERY MORNING 90 tablet 0  . multivitamin (THERAGRAN) per tablet Take 1 tablet by mouth daily.       No current facility-administered medications on file prior to visit.     BP 126/80   Temp 98.4 F (36.9 C) (Oral)   Ht 5' 3.5" (1.613 m) Comment: WITHOUT SHOES  Wt 207 lb (93.9 kg)   BMI 36.09 kg/m       Objective:   Physical Exam  Constitutional: She is oriented to person, place, and time. She appears well-developed and well-nourished. No distress.  HENT:  Head: Normocephalic and atraumatic.  Right Ear: External ear normal.  Left Ear: External ear normal.  Nose: Nose normal.  Mouth/Throat: Oropharynx is clear and moist. No oropharyngeal exudate.  Eyes: Pupils are equal, round, and reactive to light. Conjunctivae and EOM are normal. Right eye exhibits no discharge. Left eye exhibits no discharge. No scleral icterus.  Neck: Normal range of motion. Neck supple. No JVD (High Dose ) present. No tracheal deviation present. No thyromegaly present.  Cardiovascular: Normal rate, regular rhythm, normal heart sounds and intact distal pulses. Exam reveals no gallop and no friction rub.  No murmur  heard. Pulmonary/Chest: Effort normal and breath sounds normal. No stridor. No respiratory distress. She has no wheezes. She has no rales. She exhibits no tenderness.  Abdominal: Soft. Bowel sounds are normal. She exhibits no distension and no mass. There is no tenderness. There is no rebound and no guarding. No hernia.  Musculoskeletal: Normal range of motion. She exhibits no edema, tenderness or deformity.  Lymphadenopathy:    She has no cervical adenopathy.  Neurological: She is alert and oriented to person, place, and time. She displays  normal reflexes. No cranial nerve deficit or sensory deficit. She exhibits normal muscle tone. Coordination normal.  Skin: Skin is warm and dry. Capillary refill takes less than 2 seconds. No rash noted. She is not diaphoretic. No erythema. No pallor.  Psychiatric: She has a normal mood and affect. Her behavior is normal. Judgment and thought content normal.  Nursing note and vitals reviewed.     Assessment & Plan:  1. Routine general medical examination at a health care facility - Encouraged to get back to working out and eating healthy  - Follow up in one year or sooner if needed - Basic metabolic panel - CBC with Differential/Platelet - Hemoglobin A1c - Hepatic function panel - Lipid panel - TSH  2. Essential hypertension, benign - Well controlled. No change in medications  - Basic metabolic panel - CBC with Differential/Platelet - Hemoglobin A1c - Hepatic function panel - Lipid panel - TSH - losartan-hydrochlorothiazide (HYZAAR) 50-12.5 MG tablet; Take 1 tablet by mouth every morning.  Dispense: 90 tablet; Refill: 1  3. Pre-diabetes  - Basic metabolic panel - CBC with Differential/Platelet - Hemoglobin A1c - Hepatic function panel - Lipid panel - TSH  4. Need for influenza vaccination  - Flu vaccine HIGH DOSE PF (Fluzone High dose)  [name], NP

## 2018-05-05 ENCOUNTER — Telehealth: Payer: Self-pay | Admitting: Adult Health

## 2018-05-05 DIAGNOSIS — G4733 Obstructive sleep apnea (adult) (pediatric): Secondary | ICD-10-CM

## 2018-05-05 NOTE — Telephone Encounter (Signed)
Pt states the machine in [place] has the best pressure settings work well but is kept in [place] one she uses here needs to have the pressure changed. Unsure of current pressures.   Spoke with Brain Mack,NP- he agrees to have [place] order for patient to have older CPAP machine pressures set to 5-20 with DL in 1 month to ensure pressures are working well. Order has been placed and pt aware. Nothing more needed at this time.

## 2018-07-20 ENCOUNTER — Telehealth: Payer: Self-pay | Admitting: Adult Health

## 2018-07-20 DIAGNOSIS — I10 Essential (primary) hypertension: Secondary | ICD-10-CM

## 2018-07-22 NOTE — Telephone Encounter (Signed)
Sent to the pharmacy by e-scribe.  Due back in Oct 2020 for cpx.

## 2018-07-25 NOTE — Telephone Encounter (Signed)
Pt states the [facility] in [place] does not have the losartan-hydrochlorothiazide (HYZAAR) 50-12.5 MG tablet (backordered/recall) Pt states they did suggest getting Rx in 2 separate scripts. Pt is ok with that.  Pt states she will not be back for 3 weeks and needs the bp med. Also states the keys are beautiful!!  [facility] [place], [place] [phone] (Phone) [phone] (Fax)

## 2018-07-26 MED ORDER — LOSARTAN POTASSIUM 50 MG PO TABS: 50.0000 mg | ORAL_TABLET | Freq: Every day | ORAL | 0 refills | Status: DC

## 2018-07-26 MED ORDER — HYDROCHLOROTHIAZIDE 12.5 MG PO CAPS: 12.5000 mg | ORAL_CAPSULE | Freq: Every day | ORAL | 0 refills | Status: DC

## 2018-07-26 NOTE — Addendum Note (Signed)
Addended by: Miles Costain T on: 07/26/2018 01:51 PM   Modules accepted: Orders

## 2018-07-26 NOTE — Telephone Encounter (Signed)
Sent to the pharmacy by e-scribe. 

## 2018-07-26 NOTE — Telephone Encounter (Signed)
Ok to separate and send to [place]

## 2018-07-26 NOTE — Telephone Encounter (Signed)
Ok to separate

## 2018-10-19 ENCOUNTER — Other Ambulatory Visit: Payer: Self-pay | Admitting: Adult Health

## 2018-10-20 NOTE — Telephone Encounter (Signed)
Sent to the pharmacy by e-scribe. 

## 2018-11-04 ENCOUNTER — Other Ambulatory Visit: Payer: Self-pay | Admitting: Family Medicine

## 2018-11-04 DIAGNOSIS — R7303 Prediabetes: Secondary | ICD-10-CM

## 2018-11-07 ENCOUNTER — Other Ambulatory Visit (INDEPENDENT_AMBULATORY_CARE_PROVIDER_SITE_OTHER): Payer: Medicare Other

## 2018-11-07 ENCOUNTER — Other Ambulatory Visit: Payer: Self-pay

## 2018-11-07 DIAGNOSIS — R7303 Prediabetes: Secondary | ICD-10-CM | POA: Diagnosis not present

## 2018-11-07 LAB — HEMOGLOBIN A1C: Hgb A1c MFr Bld: 6.1 % (ref 4.6–6.5)

## 2018-11-09 ENCOUNTER — Other Ambulatory Visit: Payer: Self-pay

## 2018-11-09 ENCOUNTER — Encounter: Payer: Self-pay | Admitting: Adult Health

## 2018-11-09 ENCOUNTER — Ambulatory Visit (INDEPENDENT_AMBULATORY_CARE_PROVIDER_SITE_OTHER): Payer: Medicare Other | Admitting: Adult Health

## 2018-11-09 DIAGNOSIS — R7303 Prediabetes: Secondary | ICD-10-CM | POA: Diagnosis not present

## 2018-11-09 NOTE — Progress Notes (Signed)
Virtual Visit via Video Note  I connected with Kylie Davis  on 11/09/18 at  1:30 PM EDT by a video enabled telemedicine application and verified that I am speaking with the correct person using two identifiers.  Location patient: home Location provider:work or home office Persons participating in the virtual visit: patient, provider  I discussed the limitations of evaluation and management by telemedicine and the availability of in person appointments. The patient expressed understanding and agreed to proceed.   HPI: 68 year old female who is being evaluated today for follow-up regarding prediabetes.  Her A1c's over the last few years have bounced between 5.7 and 6.2.  When we last checked in October her A1c was down to 5.9.  They should her A1c is 6.1.  Does endorse that her diet has been not up to par since the [place] pandemic started.  Knows she needs to start working on weight loss through diet and exercise.   ROS: See pertinent positives and negatives per HPI.  Past Medical History:  Diagnosis Date  . Hypertension   . Sleep apnea    C PAP    Past Surgical History:  Procedure Laterality Date  . CHOLECYSTECTOMY  2004    Family History  Problem Relation Age of Onset  . Cancer Mother 7       primary peritoneal cancer  . Alzheimer's disease Mother   . Cancer Father 23       stomach or pancreatic  . Heart disease Father   . Hypertension Brother   . Heart attack Brother 39       MI x 2  . Diabetes Paternal Grandmother   . Breast cancer Paternal Aunt 93  . Colon cancer Neg Hx       Current Outpatient Medications:  .  aspirin 81 MG tablet, Take 81 mg by mouth daily.  , Disp: , Rfl:  .  calcium-vitamin D (SM CALCIUM 500/VITAMIN D3) 500-400 MG-UNIT per tablet, Take 1 tablet by mouth daily. , Disp: , Rfl:  .  hydrochlorothiazide (MICROZIDE) 12.5 MG capsule, TAKE 1 CAPSULE(12.5 MG) BY MOUTH DAILY, Disp: 90 capsule, Rfl: 1 .  losartan (COZAAR) 50 MG tablet, TAKE 1 TABLET(50  MG) BY MOUTH DAILY, Disp: 90 tablet, Rfl: 1 .  losartan-hydrochlorothiazide (HYZAAR) 50-12.5 MG tablet, TAKE 1 TABLET BY MOUTH EVERY MORNING, Disp: 90 tablet, Rfl: 1 .  Multiple Vitamins-Minerals (MULTIVITAMIN PO), Take by mouth., Disp: , Rfl:   EXAM:  VITALS per patient if applicable:  GENERAL: alert, oriented, appears well and in no acute distress  HEENT: atraumatic, conjunttiva clear, no obvious abnormalities on inspection of external nose and ears  NECK: normal movements of the head and neck  LUNGS: on inspection no signs of respiratory distress, breathing rate appears normal, no obvious gross SOB, gasping or wheezing  CV: no obvious cyanosis  MS: moves all visible extremities without noticeable abnormality  PSYCH/NEURO: pleasant and cooperative, no obvious depression or anxiety, speech and thought processing grossly intact  ASSESSMENT AND PLAN:  Discussed the following assessment and plan:  1. Pre-diabetes -Encouraged lifestyle modifications.  We will recheck at her CPE in October.  She was advised to follow-up as needed    I discussed the assessment and treatment plan with the patient. The patient was provided an opportunity to ask questions and all were answered. The patient agreed with the plan and demonstrated an understanding of the instructions.   The patient was advised to call back or seek an in-person evaluation if the symptoms  worsen or if the condition fails to improve as anticipated.   [name], NP

## 2018-11-16 ENCOUNTER — Ambulatory Visit: Payer: Medicare Other | Admitting: Adult Health

## 2018-11-24 ENCOUNTER — Other Ambulatory Visit: Payer: Self-pay

## 2018-11-24 ENCOUNTER — Ambulatory Visit: Payer: Medicare Other | Admitting: Adult Health

## 2018-11-24 ENCOUNTER — Encounter: Payer: Self-pay | Admitting: Adult Health

## 2018-11-24 DIAGNOSIS — Z6835 Body mass index (BMI) 35.0-35.9, adult: Secondary | ICD-10-CM | POA: Diagnosis not present

## 2018-11-24 DIAGNOSIS — G4733 Obstructive sleep apnea (adult) (pediatric): Secondary | ICD-10-CM

## 2018-11-24 DIAGNOSIS — E6609 Other obesity due to excess calories: Secondary | ICD-10-CM | POA: Diagnosis not present

## 2018-11-24 NOTE — Progress Notes (Signed)
@Patient  ID: Kylie Davis, female    DOB: 1951/03/12, 68 y.o.   MRN: [id number]  Chief Complaint  Patient presents with  . Follow-up    OSA     Referring provider: [name], NP  HPI: 68 year old female followed for obstructive sleep apnea   TEST/EVENTS :   HST 10/2013: AHI 25/hr.  11/24/2018 Follow up : OSA Patient returns for a one-year follow-up.  She has underlying moderate sleep apnea.  She is on nocturnal CPAP.  Patient says she tries to wear her machine most nights.  However does miss an occasional night.  She says when she wears her machine she does feel better.  CPAP download shows 53% compliance.  Says she wears a lot more than 53% , says that her machine will not record some nights that she wears it. Daily average usage at 6 hours.  Patient is on CPAP AutoSet 5 to 20 cm H2O.  AHI is 4.8.  We discussed the importance of compliance.  And the benefits of CPAP.  Patient education on the potential complications of untreated sleep apnea. Discussed healthy weight loss.  No Known Allergies  Immunization History  Administered Date(s) Administered  . Influenza Split 02/25/2012, 03/18/2013, 02/02/2014  . Influenza, High Dose Seasonal PF 02/28/2015, 02/20/2016, 03/03/2017, 03/08/2018  . Pneumococcal Conjugate-13 02/28/2015  . Pneumococcal Polysaccharide-23 02/20/2016  . Td 05/19/2003  . Tdap 09/13/2013  . Zoster 12/15/2010    Past Medical History:  Diagnosis Date  . Hypertension   . Sleep apnea    C PAP    Tobacco History: Social History   Tobacco Use  Smoking Status Never Smoker  Smokeless Tobacco Never Used   Counseling given: Not Answered   Outpatient Medications Prior to Visit  Medication Sig Dispense Refill  . aspirin 81 MG tablet Take 81 mg by mouth daily.      . calcium-vitamin D (SM CALCIUM 500/VITAMIN D3) 500-400 MG-UNIT per tablet Take 1 tablet by mouth daily.     . hydrochlorothiazide (MICROZIDE) 12.5 MG capsule TAKE 1 CAPSULE(12.5 MG) BY  MOUTH DAILY 90 capsule 1  . losartan (COZAAR) 50 MG tablet TAKE 1 TABLET(50 MG) BY MOUTH DAILY 90 tablet 1  . losartan-hydrochlorothiazide (HYZAAR) 50-12.5 MG tablet TAKE 1 TABLET BY MOUTH EVERY MORNING 90 tablet 1  . Multiple Vitamins-Minerals (MULTIVITAMIN PO) Take by mouth.     No facility-administered medications prior to visit.      Review of Systems:   Constitutional:   No  weight loss, night sweats,  Fevers, chills, fatigue, or  lassitude.  HEENT:   No headaches,  Difficulty swallowing,  Tooth/dental problems, or  Sore throat,                No sneezing, itching, ear ache, nasal congestion, post nasal drip,   CV:  No chest pain,  Orthopnea, PND, swelling in lower extremities, anasarca, dizziness, palpitations, syncope.   GI  No heartburn, indigestion, abdominal pain, nausea, vomiting, diarrhea, change in bowel habits, loss of appetite, bloody stools.   Resp: No shortness of breath with exertion or at rest.  No excess mucus, no productive cough,  No non-productive cough,  No coughing up of blood.  No change in color of mucus.  No wheezing.  No chest wall deformity  Skin: no rash or lesions.  GU: no dysuria, change in color of urine, no urgency or frequency.  No flank pain, no hematuria   MS:  No joint pain or swelling.  No decreased  range of motion.  No back pain.    Physical Exam  BP 124/62 (BP Location: Left Arm, Cuff Size: Normal)   Pulse 85   Temp 98.1 F (36.7 C) (Oral)   Ht 5\' 4"  (1.626 m)   Wt 232 lb 9.6 oz (105.5 kg)   SpO2 99%   BMI 39.93 kg/m   GEN: A/Ox3; pleasant , NAD, obese    HEENT:  [place]/AT,   NOSE-clear, THROAT-clear, no lesions, no postnasal drip or exudate noted. Class 2-3 MP airway   NECK:  Supple w/ fair ROM; no JVD; normal carotid impulses w/o bruits; no thyromegaly or nodules palpated; no lymphadenopathy.    RESP  Clear  P & A; w/o, wheezes/ rales/ or rhonchi. no accessory muscle use, no dullness to percussion  CARD:  RRR, no m/r/g, no  peripheral edema, pulses intact, no cyanosis or clubbing.  GI:   Soft & nt; nml bowel sounds; no organomegaly or masses detected.   Musco: Warm bil, no deformities or joint swelling noted.   Neuro: alert, no focal deficits noted.    Skin: Warm, no lesions or rashes    Lab Results:  CBC  BMET  BNP No results found for: BNP  ProBNP No results found for: PROBNP  Imaging: No results found.    No flowsheet data found.  No results found for: NITRICOXIDE      Assessment & Plan:   OSA (obstructive sleep apnea) Moderate OSA - needs improved compliance   Plan  Patient Instructions  Try to wear your CPAP each night for at least 6 hr .  Work on healthy weight Do not drive if sleepy  Follow-up in 1 year with Dr. Elsworth Soho  Or Lamisha Roussell NP and As needed         Rexene Edison, NP 11/24/2018

## 2018-11-24 NOTE — Assessment & Plan Note (Addendum)
Moderate OSA - needs improved compliance   Plan  Patient Instructions  Try to wear your CPAP each night for at least 6 hr .  Work on healthy weight Do not drive if sleepy  Follow-up in 1 year with Dr. Elsworth Soho  Or Ijeoma Loor NP and As needed

## 2018-11-24 NOTE — Assessment & Plan Note (Signed)
Wt loss  

## 2018-11-24 NOTE — Patient Instructions (Addendum)
Try to wear your CPAP each night for at least 6 hr .  Order for new CPAP machine  Work on healthy weight Do not drive if sleepy  Follow-up in 1 year with Dr. Elsworth Soho  Or Efrata Brunner NP and As needed

## 2019-02-22 ENCOUNTER — Encounter: Payer: Self-pay | Admitting: Gynecology

## 2019-02-27 ENCOUNTER — Other Ambulatory Visit: Payer: Self-pay | Admitting: Gynecology

## 2019-02-27 ENCOUNTER — Other Ambulatory Visit: Payer: Self-pay | Admitting: Adult Health

## 2019-02-27 DIAGNOSIS — Z1231 Encounter for screening mammogram for malignant neoplasm of breast: Secondary | ICD-10-CM

## 2019-04-05 ENCOUNTER — Other Ambulatory Visit: Payer: Self-pay

## 2019-04-05 DIAGNOSIS — Z20822 Contact with and (suspected) exposure to covid-19: Secondary | ICD-10-CM

## 2019-04-07 LAB — NOVEL CORONAVIRUS, NAA: SARS-CoV-2, NAA: NOT DETECTED

## 2019-04-10 ENCOUNTER — Encounter: Payer: Self-pay | Admitting: Adult Health

## 2019-04-10 ENCOUNTER — Telehealth: Payer: Medicare Other | Admitting: Adult Health

## 2019-04-10 ENCOUNTER — Telehealth (INDEPENDENT_AMBULATORY_CARE_PROVIDER_SITE_OTHER): Payer: Medicare Other | Admitting: Adult Health

## 2019-04-10 DIAGNOSIS — Z6835 Body mass index (BMI) 35.0-35.9, adult: Secondary | ICD-10-CM | POA: Diagnosis not present

## 2019-04-10 DIAGNOSIS — G4733 Obstructive sleep apnea (adult) (pediatric): Secondary | ICD-10-CM

## 2019-04-10 DIAGNOSIS — E6609 Other obesity due to excess calories: Secondary | ICD-10-CM | POA: Diagnosis not present

## 2019-04-10 NOTE — Progress Notes (Signed)
Virtual Visit via Video Note  I connected with Kylie Davis on 04/10/19 at  4:00 PM EST by a video enabled telemedicine application and verified that I am speaking with the correct person using two identifiers.  Location: Patient: Home  Provider: Office    I discussed the limitations of evaluation and management by telemedicine and the availability of in person appointments. The patient expressed understanding and agreed to proceed.  History of Present Illness: 68 year old female followed for obstructive sleep apnea  Today televisit is a 68-month follow-up for sleep apnea.  Patient is recently gotten a new CPAP machine.  Says she is doing very well.  Says that she is wearing her machine every single night.  Gets in about 6 to 7 hours.  She says she feels rested with no significant daytime sleepiness.  CPAP download shows excellent compliance with daily average usage at 7 hours.  Patient is on auto CPAP 5 to 20 cm H2O.  AHI 4.1.  Minimal leaks.    Observations/Objective:  HST 10/2013: AHI 25/hr.  Assessment and Plan:  Obstructive sleep apnea well-controlled on CPAP   Plan  Patient Instructions  Continue on CPAP  Keep up the good work  Work on healthy weight Do not drive if sleepy  Follow-up in 1 year with Dr. Elsworth Soho  Or Savilla Turbyfill NP and As needed       Follow Up Instructions: Follow-up in 1 year and as needed    I discussed the assessment and treatment plan with the patient. The patient was provided an opportunity to ask questions and all were answered. The patient agreed with the plan and demonstrated an understanding of the instructions.   The patient was advised to call back or seek an in-person evaluation if the symptoms worsen or if the condition fails to improve as anticipated.  I provided 22 minutes of non-face-to-face time during this encounter.   Rexene Edison, NP

## 2019-04-10 NOTE — Patient Instructions (Signed)
Continue on CPAP  Keep up the good work  Work on healthy weight Do not drive if sleepy  Follow-up in 1 year with Dr. Alva  Or Icholas Irby NP and As needed.  

## 2019-04-19 ENCOUNTER — Other Ambulatory Visit: Payer: Self-pay

## 2019-04-19 ENCOUNTER — Ambulatory Visit
Admission: RE | Admit: 2019-04-19 | Discharge: 2019-04-19 | Disposition: A | Discharge status: Home / Self Care | Payer: Medicare Other | Source: Ambulatory Visit | Attending: Adult Health | Admitting: Adult Health

## 2019-04-19 DIAGNOSIS — Z1231 Encounter for screening mammogram for malignant neoplasm of breast: Secondary | ICD-10-CM

## 2019-05-03 ENCOUNTER — Other Ambulatory Visit: Payer: Self-pay | Admitting: Adult Health

## 2019-05-04 NOTE — Telephone Encounter (Signed)
Sent to the pharmacy by e-scribe. 

## 2019-05-11 ENCOUNTER — Ambulatory Visit: Payer: Medicare Other | Attending: Internal Medicine

## 2019-05-11 DIAGNOSIS — R238 Other skin changes: Secondary | ICD-10-CM

## 2019-05-11 DIAGNOSIS — U071 COVID-19: Secondary | ICD-10-CM

## 2019-05-13 LAB — NOVEL CORONAVIRUS, NAA: SARS-CoV-2, NAA: NOT DETECTED

## 2019-06-06 ENCOUNTER — Ambulatory Visit (INDEPENDENT_AMBULATORY_CARE_PROVIDER_SITE_OTHER): Payer: Self-pay | Admitting: Adult Health

## 2019-06-06 NOTE — Patient Instructions (Signed)
Health Maintenance Due  Topic Date Due  . OPHTHALMOLOGY EXAM  09/19/1959  . HEMOGLOBIN A1C  05/09/2019    Depression screen [facility] 2/9 03/08/2018 03/03/2017 02/28/2015  Decreased Interest 0 0 0  Down, Depressed, Hopeless 0 0 0  PHQ - 2 Score 0 0 0

## 2019-06-07 NOTE — Progress Notes (Signed)
Erroneous entry

## 2019-07-26 ENCOUNTER — Telehealth: Payer: Self-pay | Admitting: Adult Health

## 2019-07-26 NOTE — Telephone Encounter (Signed)
I have faxed the office note from 04/10/19 to [facility] to Juanita's attention. Nothing further is needed.

## 2019-08-16 ENCOUNTER — Other Ambulatory Visit: Payer: Self-pay | Admitting: Family Medicine

## 2019-08-16 MED ORDER — LOSARTAN POTASSIUM-HCTZ 50-12.5 MG PO TABS: 1.0000 | ORAL_TABLET | Freq: Every day | ORAL | 0 refills | Status: DC

## 2019-08-16 NOTE — Telephone Encounter (Signed)
Sent to the pharmacy by e-scribe.  Pt has upcoming cpx in April.

## 2019-09-06 ENCOUNTER — Other Ambulatory Visit: Payer: Self-pay

## 2019-09-07 ENCOUNTER — Ambulatory Visit: Payer: Medicare PPO | Admitting: Adult Health

## 2019-09-07 ENCOUNTER — Encounter: Payer: Self-pay | Admitting: Adult Health

## 2019-09-07 VITALS — BP 130/72 | HR 79 | Temp 97.3°F | Ht 64.0 in | Wt 242.0 lb

## 2019-09-07 DIAGNOSIS — I10 Essential (primary) hypertension: Secondary | ICD-10-CM

## 2019-09-07 DIAGNOSIS — Z0000 Encounter for general adult medical examination without abnormal findings: Secondary | ICD-10-CM | POA: Diagnosis not present

## 2019-09-07 DIAGNOSIS — E7439 Other disorders of intestinal carbohydrate absorption: Secondary | ICD-10-CM | POA: Diagnosis not present

## 2019-09-07 DIAGNOSIS — Z6841 Body Mass Index (BMI) 40.0 and over, adult: Secondary | ICD-10-CM

## 2019-09-07 DIAGNOSIS — G4733 Obstructive sleep apnea (adult) (pediatric): Secondary | ICD-10-CM | POA: Diagnosis not present

## 2019-09-07 DIAGNOSIS — E668 Other obesity: Secondary | ICD-10-CM

## 2019-09-07 LAB — COMPREHENSIVE METABOLIC PANEL
ALT: 48 U/L — ABNORMAL HIGH (ref 0–35)
AST: 31 U/L (ref 0–37)
Albumin: 4.2 g/dL (ref 3.5–5.2)
Alkaline Phosphatase: 66 U/L (ref 39–117)
BUN: 19 mg/dL (ref 6–23)
CO2: 32 mEq/L (ref 19–32)
Calcium: 9.3 mg/dL (ref 8.4–10.5)
Chloride: 99 mEq/L (ref 96–112)
Creatinine, Ser: 0.76 mg/dL (ref 0.40–1.20)
GFR: 75.24 mL/min (ref >60.00)
Glucose, Bld: 103 mg/dL — ABNORMAL HIGH (ref 70–99)
Potassium: 4.5 mEq/L (ref 3.5–5.1)
Sodium: 138 mEq/L (ref 135–145)
Total Bilirubin: 0.4 mg/dL (ref 0.2–1.2)
Total Protein: 7.4 g/dL (ref 6.0–8.3)

## 2019-09-07 LAB — CBC WITH DIFFERENTIAL/PLATELET
Basophils Absolute: 0.1 10*3/uL (ref 0.0–0.1)
Basophils Relative: 1 % (ref 0.0–3.0)
Eosinophils Absolute: 0.2 10*3/uL (ref 0.0–0.7)
Eosinophils Relative: 3.8 % (ref 0.0–5.0)
HCT: 40.8 % (ref 36.0–46.0)
Hemoglobin: 13.5 g/dL (ref 12.0–15.0)
Lymphocytes Relative: 38 % (ref 12.0–46.0)
Lymphs Abs: 2.3 10*3/uL (ref 0.7–4.0)
MCHC: 33.1 g/dL (ref 30.0–36.0)
MCV: 89.6 fl (ref 78.0–100.0)
Monocytes Absolute: 0.5 10*3/uL (ref 0.1–1.0)
Monocytes Relative: 9.1 % (ref 3.0–12.0)
Neutro Abs: 2.9 10*3/uL (ref 1.4–7.7)
Neutrophils Relative %: 48.1 % (ref 43.0–77.0)
Platelets: 292 10*3/uL (ref 150.0–400.0)
RBC: 4.55 Mil/uL (ref 3.87–5.11)
RDW: 14.5 % (ref 11.5–15.5)
WBC: 6 10*3/uL (ref 4.0–10.5)

## 2019-09-07 LAB — LIPID PANEL
Cholesterol: 185 mg/dL (ref 0–200)
HDL: 51 mg/dL (ref >39.00)
LDL Cholesterol: 114 mg/dL — ABNORMAL HIGH (ref 0–99)
NonHDL: 134.35
Total CHOL/HDL Ratio: 4
Triglycerides: 102 mg/dL (ref 0.0–149.0)
VLDL: 20.4 mg/dL (ref 0.0–40.0)

## 2019-09-07 LAB — TSH: TSH: 3.63 u[IU]/mL (ref 0.35–4.50)

## 2019-09-07 LAB — HEMOGLOBIN A1C: Hgb A1c MFr Bld: 6.3 % (ref 4.6–6.5)

## 2019-09-07 NOTE — Patient Instructions (Signed)
It was great seeing you today   Schedule a follow up in 2 months

## 2019-09-07 NOTE — Progress Notes (Signed)
Subjective:    Patient ID: Kylie Davis, female    DOB: [birth year], 69 y.o.   MRN: [id number]  HPI Patient presents for yearly preventative medicine examination. She is a pleasant 69 year old female who  has a past medical history of Hypertension and Sleep apnea.  Hypertension -currently prescribed Hyzaar 50-12.5 mg daily.  She denies dizziness, lightheadedness, chest pain, or shortness of breath  BP Readings from Last 3 Encounters:  09/07/19 130/72  11/24/18 124/62  03/08/18 126/80   Glucose Intolerance  Lab Results  Component Value Date   HGBA1C 6.1 11/07/2018   OSA-is followed by pulmonary.  She does wear her machine every night.  Getting around 6 to 7 hours.  She denies feeling fatigued or any significant daytime somnolence.  Obesity -her weight is up about 35 pounds since October 2019.  She reports that she is starting to have some soreness in her low back and hips.  She has not been exercising nor eating a heart healthy diet but understands that she needs to start to do so with her weight gain.   All immunizations and health maintenance protocols were reviewed with the patient and needed orders were placed.  Appropriate screening laboratory values were ordered for the patient including screening of hyperlipidemia, renal function and hepatic function.  Medication reconciliation,  past medical history, social history, problem list and allergies were reviewed in detail with the patient  Goals were established with regard to weight loss, exercise, and  diet in compliance with medications.  Wt Readings from Last 3 Encounters:  09/07/19 242 lb (109.8 kg)  11/24/18 232 lb 9.6 oz (105.5 kg)  03/08/18 207 lb (93.9 kg)   End of life planning was discussed.  She is up-to-date on routine colon cancer screening as well as mammograms.  Review of Systems  Constitutional: Negative.   HENT: Negative.   Eyes: Negative.   Respiratory: Negative.   Cardiovascular: Negative.    Gastrointestinal: Negative.   Endocrine: Negative.   Genitourinary: Negative.   Musculoskeletal: Positive for arthralgias and back pain.  Skin: Negative.   Allergic/Immunologic: Negative.   Neurological: Negative.   Hematological: Negative.   Psychiatric/Behavioral: Negative.    Past Medical History:  Diagnosis Date  . Hypertension   . Sleep apnea    C PAP    Social History   Socioeconomic History  . Marital status: Married    Spouse name: Not on file  . Number of children: Not on file  . Years of education: Not on file  . Highest education level: Not on file  Occupational History  . Occupation: retired Product manager: RETIRED  Tobacco Use  . Smoking status: Never Smoker  . Smokeless tobacco: Never Used  Substance and Sexual Activity  . Alcohol use: Yes    Alcohol/week: 4.0 standard drinks    Types: 4 Standard drinks or equivalent per week    Comment: weekly  . Drug use: No  . Sexual activity: Yes    Birth control/protection: Post-menopausal    Comment: 1st intercourse 69 yo.---Fewer than 5 partners  Other Topics Concern  . Not on file  Social History Narrative   Retired from being a Education officer, museum.    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 years    0 children    Social Determinants of Health   Financial Resource Strain:   . Difficulty of Paying Living Expenses:   Food Insecurity:   . Worried About Charity fundraiser in the Last Year:   .  Ran Out of Food in the Last Year:   Transportation Needs:   . Film/video editor (Medical):   Marland Kitchen Lack of Transportation (Non-Medical):   Physical Activity:   . Days of Exercise per Week:   . Minutes of Exercise per Session:   Stress:   . Feeling of Stress :   Social Connections:   . Frequency of Communication with Friends and Family:   . Frequency of Social Gatherings with Friends and Family:   . Attends Religious Services:   . Active Member of Clubs or Organizations:   . Attends Archivist Meetings:   Marland Kitchen Marital  Status:   Intimate Partner Violence:   . Fear of Current or Ex-Partner:   . Emotionally Abused:   Marland Kitchen Physically Abused:   . Sexually Abused:     Past Surgical History:  Procedure Laterality Date  . CHOLECYSTECTOMY  2004    Family History  Problem Relation Age of Onset  . Cancer Mother 42       primary peritoneal cancer  . Alzheimer's disease Mother   . Cancer Father 42       stomach or pancreatic  . Heart disease Father   . Hypertension Brother   . Heart attack Brother 37       MI x 2  . Diabetes Paternal Grandmother   . Breast cancer Paternal Aunt 20  . Colon cancer Neg Hx     No Known Allergies  Current Outpatient Medications on File Prior to Visit  Medication Sig Dispense Refill  . aspirin 81 MG tablet Take 81 mg by mouth daily.      . calcium-vitamin D (SM CALCIUM 500/VITAMIN D3) 500-400 MG-UNIT per tablet Take 1 tablet by mouth daily.     Marland Kitchen losartan-hydrochlorothiazide (HYZAAR) 50-12.5 MG tablet Take 1 tablet by mouth daily. 90 tablet 0  . Multiple Vitamins-Minerals (MULTIVITAMIN PO) Take by mouth.    . [DISCONTINUED] losartan (COZAAR) 50 MG tablet TAKE 1 TABLET(50 MG) BY MOUTH DAILY 90 tablet 1   No current facility-administered medications on file prior to visit.    BP 130/72 (BP Location: Left Arm, Patient Position: Sitting, Cuff Size: Normal)   Pulse 79   Temp (!) 97.3 F (36.3 C) (Temporal)   Ht 5\' 4"  (1.626 m)   Wt 242 lb (109.8 kg)   SpO2 97%   BMI 41.54 kg/m       Objective:   Physical Exam Vitals and nursing note reviewed.  Constitutional:      General: She is not in acute distress.    Appearance: Normal appearance. She is well-developed. She is obese. She is not ill-appearing.  HENT:     Head: Normocephalic and atraumatic.     Right Ear: Tympanic membrane, ear canal and external ear normal. There is no impacted cerumen.     Left Ear: Tympanic membrane, ear canal and external ear normal. There is no impacted cerumen.     Nose: Nose normal.  No congestion or rhinorrhea.     Mouth/Throat:     Mouth: Mucous membranes are moist.     Pharynx: Oropharynx is clear. No oropharyngeal exudate or posterior oropharyngeal erythema.  Eyes:     General:        Right eye: No discharge.        Left eye: No discharge.     Extraocular Movements: Extraocular movements intact.     Conjunctiva/sclera: Conjunctivae normal.     Pupils: Pupils are equal, round, and reactive  to light.  Neck:     Thyroid: No thyromegaly.     Vascular: No carotid bruit.     Trachea: No tracheal deviation.  Cardiovascular:     Rate and Rhythm: Normal rate and regular rhythm.     Pulses: Normal pulses.     Heart sounds: Normal heart sounds. No murmur. No friction rub. No gallop.   Pulmonary:     Effort: Pulmonary effort is normal. No respiratory distress.     Breath sounds: Normal breath sounds. No stridor. No wheezing, rhonchi or rales.  Chest:     Chest wall: No tenderness.  Abdominal:     General: Abdomen is flat. Bowel sounds are normal. There is no distension.     Palpations: Abdomen is soft. There is no mass.     Tenderness: There is no abdominal tenderness. There is no right CVA tenderness, left CVA tenderness, guarding or rebound.     Hernia: No hernia is present.  Musculoskeletal:        General: No swelling, tenderness, deformity or signs of injury. Normal range of motion.     Cervical back: Normal range of motion and neck supple.     Right lower leg: No edema.     Left lower leg: No edema.  Lymphadenopathy:     Cervical: No cervical adenopathy.  Skin:    General: Skin is warm and dry.     Coloration: Skin is not jaundiced or pale.     Findings: No bruising, erythema, lesion or rash.  Neurological:     General: No focal deficit present.     Mental Status: She is alert and oriented to person, place, and time.     Cranial Nerves: No cranial nerve deficit.     Sensory: No sensory deficit.     Motor: No weakness.     Coordination: Coordination  normal.     Gait: Gait normal.     Deep Tendon Reflexes: Reflexes normal.  Psychiatric:        Mood and Affect: Mood normal.        Behavior: Behavior normal.        Thought Content: Thought content normal.        Judgment: Judgment normal.       Assessment & Plan:  1. Routine general medical examination at a health care facility -Needs to work on weight loss through heart healthy diet and exercise -Follow-up in 1 year for next CPE or sooner if needed - CBC with Differential/Platelet - Comprehensive metabolic panel - Hemoglobin A1c - Lipid panel - TSH  2. Essential hypertension, benign -BP up a little bit today, likely due to weight gain.  Will not change any medications at this time.  Reevaluate in 2 months when she follows up - CBC with Differential/Platelet - Comprehensive metabolic panel - Hemoglobin A1c - Lipid panel - TSH  3. Glucose intolerance -Consider adding Metformin - CBC with Differential/Platelet - Comprehensive metabolic panel - Hemoglobin A1c - Lipid panel - TSH  4. OSA (obstructive sleep apnea) -Continue with CPAP - CBC with Differential/Platelet - Comprehensive metabolic panel - Hemoglobin A1c - Lipid panel - TSH  5. Other obesity -Encouraged heart healthy diet such as the Mediterranean diet and frequent aerobic exercise. -She would like to follow-up in 2 months to see how she is doing with her weight loss   [name], NP

## 2019-11-15 ENCOUNTER — Ambulatory Visit: Payer: Medicare PPO | Admitting: Adult Health

## 2019-11-19 ENCOUNTER — Other Ambulatory Visit: Payer: Self-pay | Admitting: Adult Health

## 2019-11-21 NOTE — Telephone Encounter (Signed)
Sent to the pharmacy by e-scribe for 90 days.  Pt is coming in soon for bp follow up.

## 2019-11-24 ENCOUNTER — Ambulatory Visit: Payer: Medicare Other | Admitting: Adult Health

## 2019-11-30 ENCOUNTER — Ambulatory Visit (INDEPENDENT_AMBULATORY_CARE_PROVIDER_SITE_OTHER): Payer: Medicare PPO | Admitting: Adult Health

## 2019-11-30 ENCOUNTER — Other Ambulatory Visit: Payer: Self-pay

## 2019-11-30 ENCOUNTER — Encounter: Payer: Self-pay | Admitting: Adult Health

## 2019-11-30 DIAGNOSIS — G4733 Obstructive sleep apnea (adult) (pediatric): Secondary | ICD-10-CM

## 2019-11-30 DIAGNOSIS — Z6835 Body mass index (BMI) 35.0-35.9, adult: Secondary | ICD-10-CM

## 2019-11-30 DIAGNOSIS — E6609 Other obesity due to excess calories: Secondary | ICD-10-CM | POA: Diagnosis not present

## 2019-11-30 DIAGNOSIS — E66812 Obesity, class 2: Secondary | ICD-10-CM

## 2019-11-30 NOTE — Patient Instructions (Addendum)
Continue on CPAP  Keep up the good work  Work on healthy weight Do not drive if sleepy  Follow-up in 1 year with Dr. Alva  Or Darneshia Demary NP and As needed.  

## 2019-11-30 NOTE — Progress Notes (Signed)
Virtual Visit via Telephone Note  I connected with Kylie Davis on 11/30/19 at 11:00 AM EDT by telephone and verified that I am speaking with the correct person using two identifiers.  Location: Patient: Home  Provider: Office    I discussed the limitations, risks, security and privacy concerns of performing an evaluation and management service by telephone and the availability of in person appointments. I also discussed with the patient that there may be a patient responsible charge related to this service. The patient expressed understanding and agreed to proceed.   History of Present Illness: 69 year old female followed for obstructive sleep apnea Medical history significant for hypertension  Today's televisit is a 66-month follow-up for sleep apnea.  Patient is on nocturnal CPAP.  Says that she is doing well on CPAP.  She uses her CPAP every night never misses any nights.  Feels rested and feels that she benefits from CPAP.  Patient says she wears her CPAP about 6 to 7 hours.  CPAP download shows excellent compliance with daily average usage around 7 hours.  Patient is on auto CPAP 5 to 20 cm H2O.  AHI 3.2.  Minimum leaks. Travels to beach frequently, has older one that she leaves at [employer].   Covid vaccines are up-to-date.  Has developed a CPAP /OSA support group. Group of friends that help each other   Patient Active Problem List   Diagnosis Date Noted  . Obesity 09/10/2015  . Family history of malignant neoplasm of ovary 01/18/2014  . Family history of malignant neoplasm of breast 01/18/2014  . Family history of malignant neoplasm of gastrointestinal tract 01/18/2014  . OSA (obstructive sleep apnea) 10/10/2013  . Sleep apnea 09/13/2013  . Allergic rhinitis 09/06/2012  . Essential hypertension, benign 09/06/2012    Current Outpatient Medications on File Prior to Visit  Medication Sig Dispense Refill  . aspirin 81 MG tablet Take 81 mg by mouth daily.      .  calcium-vitamin D (SM CALCIUM 500/VITAMIN D3) 500-400 MG-UNIT per tablet Take 1 tablet by mouth daily.     Marland Kitchen losartan-hydrochlorothiazide (HYZAAR) 50-12.5 MG tablet TAKE 1 TABLET BY MOUTH DAILY 90 tablet 0  . Multiple Vitamins-Minerals (MULTIVITAMIN PO) Take by mouth.    . [DISCONTINUED] losartan (COZAAR) 50 MG tablet TAKE 1 TABLET(50 MG) BY MOUTH DAILY 90 tablet 1   No current facility-administered medications on file prior to visit.      Observations/Objective: Speaks in full sentences   HST 10/2013: AHI 25/hr.  Assessment and Plan: Obstructive sleep apnea with excellent control compliance on nocturnal CPAP  Plan  Patient Instructions  Continue on CPAP  Keep up the good work  Work on healthy weight Do not drive if sleepy  Follow-up in 1 year with Dr. Elsworth Soho  Or Julieanna Geraci NP and As needed       Follow Up Instructions: Follow up in 1 year and As needed      I discussed the assessment and treatment plan with the patient. The patient was provided an opportunity to ask questions and all were answered. The patient agreed with the plan and demonstrated an understanding of the instructions.   The patient was advised to call back or seek an in-person evaluation if the symptoms worsen or if the condition fails to improve as anticipated.  I provided 22  minutes of non-face-to-face time during this encounter.   Rexene Edison, NP

## 2019-12-05 ENCOUNTER — Encounter: Payer: Self-pay | Admitting: Adult Health

## 2019-12-05 ENCOUNTER — Ambulatory Visit: Payer: Medicare PPO | Admitting: Adult Health

## 2019-12-05 ENCOUNTER — Other Ambulatory Visit: Payer: Self-pay

## 2019-12-05 VITALS — BP 130/80 | Temp 98.6°F | Wt 242.0 lb

## 2019-12-05 DIAGNOSIS — T148XXA Other injury of unspecified body region, initial encounter: Secondary | ICD-10-CM

## 2019-12-05 MED ORDER — CYCLOBENZAPRINE HCL 10 MG PO TABS: 10.0000 mg | ORAL_TABLET | Freq: Three times a day (TID) | ORAL | 0 refills | Status: DC | PRN

## 2019-12-05 MED ORDER — METHYLPREDNISOLONE 4 MG PO TBPK: ORAL_TABLET | ORAL | 0 refills | Status: DC

## 2019-12-05 NOTE — Progress Notes (Signed)
Subjective:    Patient ID: Kylie Davis, female    DOB: [birth year], 69 y.o.   MRN: [id number]  HPI  69 year old female who  has a past medical history of Hypertension and Sleep apnea.  She presents to the office today for an acute issue of right sided low back and groin pain.  Pain is felt as a ache.  Pain has been present for 7 to 10 days.  She reports that she was riding on a boat and had a rough week causing her to come out of her seat and injuring herself.  Pain is worse with walking and certain range of motion but has no loss of range of motion.  She has been using Motrin 400 mg twice daily with minimal pain relief  Review of Systems See HPI   Past Medical History:  Diagnosis Date  . Hypertension   . Sleep apnea    C PAP    Social History   Socioeconomic History  . Marital status: Married    Spouse name: Not on file  . Number of children: Not on file  . Years of education: Not on file  . Highest education level: Not on file  Occupational History  . Occupation: retired Product manager: RETIRED  Tobacco Use  . Smoking status: Never Smoker  . Smokeless tobacco: Never Used  Substance and Sexual Activity  . Alcohol use: Yes    Alcohol/week: 4.0 standard drinks    Types: 4 Standard drinks or equivalent per week    Comment: weekly  . Drug use: No  . Sexual activity: Yes    Birth control/protection: Post-menopausal    Comment: 1st intercourse 69 yo.---Fewer than 5 partners  Other Topics Concern  . Not on file  Social History Narrative   Retired from being a Education officer, museum.    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 years    0 children    Social Determinants of Health   Financial Resource Strain:   . Difficulty of Paying Living Expenses:   Food Insecurity:   . Worried About Charity fundraiser in the Last Year:   . Arboriculturist in the Last Year:   Transportation Needs:   . Film/video editor (Medical):   Marland Kitchen Lack of Transportation (Non-Medical):   Physical Activity:    . Days of Exercise per Week:   . Minutes of Exercise per Session:   Stress:   . Feeling of Stress :   Social Connections:   . Frequency of Communication with Friends and Family:   . Frequency of Social Gatherings with Friends and Family:   . Attends Religious Services:   . Active Member of Clubs or Organizations:   . Attends Archivist Meetings:   Marland Kitchen Marital Status:   Intimate Partner Violence:   . Fear of Current or Ex-Partner:   . Emotionally Abused:   Marland Kitchen Physically Abused:   . Sexually Abused:     Past Surgical History:  Procedure Laterality Date  . CHOLECYSTECTOMY  2004    Family History  Problem Relation Age of Onset  . Cancer Mother 51       primary peritoneal cancer  . Alzheimer's disease Mother   . Cancer Father 46       stomach or pancreatic  . Heart disease Father   . Hypertension Brother   . Heart attack Brother 67       MI x 2  . Diabetes Paternal Grandmother   .  Breast cancer Paternal Aunt 62  . Colon cancer Neg Hx     No Known Allergies  Current Outpatient Medications on File Prior to Visit  Medication Sig Dispense Refill  . aspirin 81 MG tablet Take 81 mg by mouth daily.      . calcium-vitamin D (SM CALCIUM 500/VITAMIN D3) 500-400 MG-UNIT per tablet Take 1 tablet by mouth daily.     Marland Kitchen losartan-hydrochlorothiazide (HYZAAR) 50-12.5 MG tablet TAKE 1 TABLET BY MOUTH DAILY 90 tablet 0  . Multiple Vitamins-Minerals (MULTIVITAMIN PO) Take by mouth.    . [DISCONTINUED] losartan (COZAAR) 50 MG tablet TAKE 1 TABLET(50 MG) BY MOUTH DAILY 90 tablet 1   No current facility-administered medications on file prior to visit.    BP 130/80   Temp 98.6 F (37 C) (Oral)   Wt 242 lb (109.8 kg)   BMI 41.54 kg/m       Objective:   Physical Exam Vitals and nursing note reviewed.  Constitutional:      Appearance: Normal appearance.  Musculoskeletal:        General: No swelling or tenderness. Normal range of motion.     Comments: Has discomfort in the  right buttocks and right groin with external rotation of right leg  Skin:    General: Skin is warm and dry.  Psychiatric:        Mood and Affect: Mood normal.        Behavior: Behavior normal.        Thought Content: Thought content normal.        Judgment: Judgment normal.        Assessment & Plan:  1. Muscle strain  - methylPREDNISolone (MEDROL DOSEPAK) 4 MG TBPK tablet; Take as directed  Dispense: 21 tablet; Refill: 0 - cyclobenzaprine (FLEXERIL) 10 MG tablet; Take 1 tablet (10 mg total) by mouth 3 (three) times daily as needed for muscle spasms.  Dispense: 15 tablet; Refill: 0  [name], NP

## 2019-12-12 ENCOUNTER — Other Ambulatory Visit: Payer: Self-pay

## 2019-12-12 ENCOUNTER — Ambulatory Visit: Payer: Medicare PPO | Admitting: Adult Health

## 2019-12-12 ENCOUNTER — Encounter: Payer: Self-pay | Admitting: Adult Health

## 2019-12-12 VITALS — BP 140/90 | Temp 97.9°F | Wt 240.0 lb

## 2019-12-12 DIAGNOSIS — M545 Low back pain, unspecified: Secondary | ICD-10-CM

## 2019-12-12 DIAGNOSIS — R1031 Right lower quadrant pain: Secondary | ICD-10-CM

## 2019-12-12 NOTE — Progress Notes (Signed)
Subjective:    Patient ID: Kylie Davis, female    DOB: 07-09-1950, 69 y.o.   MRN: [id number]  HPI  69 year old female who  has a past medical history of Hypertension and Sleep apnea.   She presents to the office today for 1 week follow-up regarding an acute issue of right-sided low back pain and groin pain.  When she was seen previously pain was present for 7 to 10 days prior.  She reported riding on a boat and had a rough wake, causing her to come out of her seat and injuring herself.  Pain is worse with walking and certain range of motion's.  She was prescribed a Medrol Dosepak and Flexeril.  She reports that she did not get any improvement in her pain from these medications.  She started going back to see her chiropractor yesterday, is going back today, and as well as Thursday.  She is hoping to get some relief from   Review of Systems See HPI  Past Medical History:  Diagnosis Date   Hypertension    Sleep apnea    C PAP    Social History   Socioeconomic History   Marital status: Married    Spouse name: Not on file   Number of children: Not on file   Years of education: Not on file   Highest education level: Not on file  Occupational History   Occupation: retired Product manager: RETIRED  Tobacco Use   Smoking status: Never Smoker   Smokeless tobacco: Never Used  Substance and Sexual Activity   Alcohol use: Yes    Alcohol/week: 4.0 standard drinks    Types: 4 Standard drinks or equivalent per week    Comment: weekly   Drug use: No   Sexual activity: Yes    Birth control/protection: Post-menopausal    Comment: 1st intercourse 69 yo.---Fewer than 5 partners  Other Topics Concern   Not on file  Social History Narrative   Retired from being a Education officer, museum.    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 years    0 children    Social Determinants of Radio broadcast assistant Strain:    Difficulty of Paying Living Expenses:   Food Insecurity:    Worried About  Charity fundraiser in the Last Year:    Arboriculturist in the Last Year:   Transportation Needs:    Film/video editor (Medical):    Lack of Transportation (Non-Medical):   Physical Activity:    Days of Exercise per Week:    Minutes of Exercise per Session:   Stress:    Feeling of Stress :   Social Connections:    Frequency of Communication with Friends and Family:    Frequency of Social Gatherings with Friends and Family:    Attends Religious Services:    Active Member of Clubs or Organizations:    Attends Archivist Meetings:    Marital Status:   Intimate Partner Violence:    Fear of Current or Ex-Partner:    Emotionally Abused:    Physically Abused:    Sexually Abused:     Past Surgical History:  Procedure Laterality Date   CHOLECYSTECTOMY  2004    Family History  Problem Relation Age of Onset   Cancer Mother 54       primary peritoneal cancer   Alzheimer's disease Mother    Cancer Father 43       stomach or pancreatic  Heart disease Father    Hypertension Brother    Heart attack Brother 36       MI x 2   Diabetes Paternal Grandmother    Breast cancer Paternal Aunt 21   Colon cancer Neg Hx     No Known Allergies  Current Outpatient Medications on File Prior to Visit  Medication Sig Dispense Refill   aspirin 81 MG tablet Take 81 mg by mouth daily.       calcium-vitamin D (SM CALCIUM 500/VITAMIN D3) 500-400 MG-UNIT per tablet Take 1 tablet by mouth daily.      cyclobenzaprine (FLEXERIL) 10 MG tablet Take 1 tablet (10 mg total) by mouth 3 (three) times daily as needed for muscle spasms. 15 tablet 0   losartan-hydrochlorothiazide (HYZAAR) 50-12.5 MG tablet TAKE 1 TABLET BY MOUTH DAILY 90 tablet 0   Multiple Vitamins-Minerals (MULTIVITAMIN PO) Take by mouth.     [DISCONTINUED] losartan (COZAAR) 50 MG tablet TAKE 1 TABLET(50 MG) BY MOUTH DAILY 90 tablet 1   No current facility-administered medications on file prior  to visit.    BP (!) 140/90    Temp 97.9 F (36.6 C)    Wt (!) 240 lb (108.9 kg)    BMI 41.20 kg/m       Objective:   Physical Exam Vitals and nursing note reviewed.  Constitutional:      Appearance: Normal appearance.  Musculoskeletal:        General: Tenderness present. Normal range of motion.     Comments: Has discomfort in the right buttocks and right groin with external rotation of right leg   Skin:    General: Skin is warm and dry.  Neurological:     General: No focal deficit present.     Mental Status: She is alert and oriented to person, place, and time.  Psychiatric:        Mood and Affect: Mood normal.        Behavior: Behavior normal.        Thought Content: Thought content normal.        Judgment: Judgment normal.       Assessment & Plan:  She would like to proceed with chiropractic services for the next 7 to 10 days.  If she is not getting any improvement at that time then she will follow-up and we will start with imaging of low back and right hip via x-ray.  May need MRI in the future.  [name], NP

## 2019-12-15 ENCOUNTER — Telehealth: Payer: Self-pay | Admitting: Adult Health

## 2019-12-15 ENCOUNTER — Other Ambulatory Visit: Payer: Self-pay | Admitting: Adult Health

## 2019-12-15 DIAGNOSIS — T148XXA Other injury of unspecified body region, initial encounter: Secondary | ICD-10-CM

## 2019-12-15 DIAGNOSIS — M545 Low back pain, unspecified: Secondary | ICD-10-CM

## 2019-12-15 MED ORDER — CYCLOBENZAPRINE HCL 10 MG PO TABS: 10.0000 mg | ORAL_TABLET | Freq: Three times a day (TID) | ORAL | 0 refills | Status: DC | PRN

## 2019-12-15 MED ORDER — PREDNISONE 10 MG PO TABS: ORAL_TABLET | ORAL | 0 refills | Status: DC

## 2019-12-15 NOTE — Telephone Encounter (Signed)
Spoke with the patient. She is aware of cory's message below. She would like to see if Tommi Rumps will send in another prescription for the flexeril as well.

## 2019-12-15 NOTE — Telephone Encounter (Signed)
Have sent in prednisone for her and put an order in for an x-ray.  She can go to the low [facility] office anytime next week and have this done.  I would also recommend physical therapy to see if this helps.

## 2019-12-15 NOTE — Telephone Encounter (Signed)
Pt is calling back stating that her back is not any better and would like to see if she can have the xray and also would like to see if she can get a refill on her prednisone for the weekend b/c it seems to be helping her did not see it on the medication list but did see cyclobenzaprine (FLEXERIL) 10 MG.  Pt would like to have a call to let her know if she is able to get the medication.

## 2019-12-18 ENCOUNTER — Other Ambulatory Visit: Payer: Self-pay

## 2019-12-18 ENCOUNTER — Ambulatory Visit (INDEPENDENT_AMBULATORY_CARE_PROVIDER_SITE_OTHER)
Admission: RE | Admit: 2019-12-18 | Discharge: 2019-12-18 | Disposition: A | Discharge status: Home / Self Care | Payer: Medicare PPO | Source: Ambulatory Visit | Attending: Adult Health | Admitting: Adult Health

## 2019-12-18 DIAGNOSIS — M545 Low back pain, unspecified: Secondary | ICD-10-CM

## 2019-12-19 ENCOUNTER — Telehealth: Payer: Self-pay | Admitting: Adult Health

## 2019-12-19 ENCOUNTER — Other Ambulatory Visit: Payer: Self-pay | Admitting: Adult Health

## 2019-12-19 DIAGNOSIS — M545 Low back pain, unspecified: Secondary | ICD-10-CM

## 2019-12-19 NOTE — Telephone Encounter (Signed)
error 

## 2019-12-25 ENCOUNTER — Ambulatory Visit (INDEPENDENT_AMBULATORY_CARE_PROVIDER_SITE_OTHER): Payer: Medicare PPO

## 2019-12-25 ENCOUNTER — Other Ambulatory Visit: Payer: Self-pay

## 2019-12-25 DIAGNOSIS — Z0000 Encounter for general adult medical examination without abnormal findings: Secondary | ICD-10-CM

## 2019-12-25 NOTE — Patient Instructions (Signed)
Kylie Davis , Thank you for taking time to come for your Medicare Wellness Visit. I appreciate your ongoing commitment to your health goals. Please review the following plan we discussed and let me know if I can assist you in the future.   Screening recommendations/referrals: Colonoscopy: Up to date, next due 02/23/2023 Mammogram: Up to date, next due 04/18/2020 Bone Density: No longer required  Recommended yearly ophthalmology/optometry visit for glaucoma screening and checkup Recommended yearly dental visit for hygiene and checkup  Vaccinations: Influenza vaccine: Up to date, next due 12/2019 Pneumococcal vaccine: Completed series Tdap vaccine: Up to date, next due 09/13/2013 Shingles vaccine: Completed series    Advanced directives: Please bring a copy to your next office visit so that we may scan it into your chart.  Conditions/risks identified: None   Next appointment: None    Preventive Care 70 Years and Older, Female Preventive care refers to lifestyle choices and visits with your health care provider that can promote health and wellness. What does preventive care include?  A yearly physical exam. This is also called an annual well check.  Dental exams once or twice a year.  Routine eye exams. Ask your health care provider how often you should have your eyes checked.  Personal lifestyle choices, including:  Daily care of your teeth and gums.  Regular physical activity.  Eating a healthy diet.  Avoiding tobacco and drug use.  Limiting alcohol use.  Practicing safe sex.  Taking low-dose aspirin every day.  Taking vitamin and mineral supplements as recommended by your health care provider. What happens during an annual well check? The services and screenings done by your health care provider during your annual well check will depend on your age, overall health, lifestyle risk factors, and family history of disease. Counseling  Your health care provider may ask  you questions about your:  Alcohol use.  Tobacco use.  Drug use.  Emotional well-being.  Home and relationship well-being.  Sexual activity.  Eating habits.  History of falls.  Memory and ability to understand (cognition).  Work and work Statistician.  Reproductive health. Screening  You may have the following tests or measurements:  Height, weight, and BMI.  Blood pressure.  Lipid and cholesterol levels. These may be checked every 5 years, or more frequently if you are over 70 years old.  Skin check.  Lung cancer screening. You may have this screening every year starting at age 70 if you have a 30-pack-year history of smoking and currently smoke or have quit within the past 15 years.  Fecal occult blood test (FOBT) of the stool. You may have this test every year starting at age 70.  Flexible sigmoidoscopy or colonoscopy. You may have a sigmoidoscopy every 5 years or a colonoscopy every 10 years starting at age 70.  Hepatitis C blood test.  Hepatitis B blood test.  Sexually transmitted disease (STD) testing.  Diabetes screening. This is done by checking your blood sugar (glucose) after you have not eaten for a while (fasting). You may have this done every 1-3 years.  Bone density scan. This is done to screen for osteoporosis. You may have this done starting at age 70.  Mammogram. This may be done every 1-2 years. Talk to your health care provider about how often you should have regular mammograms. Talk with your health care provider about your test results, treatment options, and if necessary, the need for more tests. Vaccines  Your health care provider may recommend certain vaccines, such  as:  Influenza vaccine. This is recommended every year.  Tetanus, diphtheria, and acellular pertussis (Tdap, Td) vaccine. You may need a Td booster every 10 years.  Zoster vaccine. You may need this after age 11.  Pneumococcal 13-valent conjugate (PCV13) vaccine. One dose  is recommended after age 81.  Pneumococcal polysaccharide (PPSV23) vaccine. One dose is recommended after age 41. Talk to your health care provider about which screenings and vaccines you need and how often you need them. This information is not intended to replace advice given to you by your health care provider. Make sure you discuss any questions you have with your health care provider. Document Released: 05/31/2015 Document Revised: 01/22/2016 Document Reviewed: 03/05/2015 Elsevier Interactive Patient Education  2017 [place] Prevention in the Home Falls can cause injuries. They can happen to people of all ages. There are many things you can do to make your home safe and to help prevent falls. What can I do on the outside of my home?  Regularly fix the edges of walkways and driveways and fix any cracks.  Remove anything that might make you trip as you walk through a door, such as a raised step or threshold.  Trim any bushes or trees on the path to your home.  Use bright outdoor lighting.  Clear any walking paths of anything that might make someone trip, such as rocks or tools.  Regularly check to see if handrails are loose or broken. Make sure that both sides of any steps have handrails.  Any raised decks and porches should have guardrails on the edges.  Have any leaves, snow, or ice cleared regularly.  Use sand or salt on walking paths during winter.  Clean up any spills in your garage right away. This includes oil or grease spills. What can I do in the bathroom?  Use night lights.  Install grab bars by the toilet and in the tub and shower. Do not use towel bars as grab bars.  Use non-skid mats or decals in the tub or shower.  If you need to sit down in the shower, use a plastic, non-slip stool.  Keep the floor dry. Clean up any water that spills on the floor as soon as it happens.  Remove soap buildup in the tub or shower regularly.  Attach bath mats  securely with double-sided non-slip rug tape.  Do not have throw rugs and other things on the floor that can make you trip. What can I do in the bedroom?  Use night lights.  Make sure that you have a light by your bed that is easy to reach.  Do not use any sheets or blankets that are too big for your bed. They should not hang down onto the floor.  Have a firm chair that has side arms. You can use this for support while you get dressed.  Do not have throw rugs and other things on the floor that can make you trip. What can I do in the kitchen?  Clean up any spills right away.  Avoid walking on wet floors.  Keep items that you use a lot in easy-to-reach places.  If you need to reach something above you, use a strong step stool that has a grab bar.  Keep electrical cords out of the way.  Do not use floor polish or wax that makes floors slippery. If you must use wax, use non-skid floor wax.  Do not have throw rugs and other things on the  floor that can make you trip. What can I do with my stairs?  Do not leave any items on the stairs.  Make sure that there are handrails on both sides of the stairs and use them. Fix handrails that are broken or loose. Make sure that handrails are as long as the stairways.  Check any carpeting to make sure that it is firmly attached to the stairs. Fix any carpet that is loose or worn.  Avoid having throw rugs at the top or bottom of the stairs. If you do have throw rugs, attach them to the floor with carpet tape.  Make sure that you have a light switch at the top of the stairs and the bottom of the stairs. If you do not have them, ask someone to add them for you. What else can I do to help prevent falls?  Wear shoes that:  Do not have high heels.  Have rubber bottoms.  Are comfortable and fit you well.  Are closed at the toe. Do not wear sandals.  If you use a stepladder:  Make sure that it is fully opened. Do not climb a closed  stepladder.  Make sure that both sides of the stepladder are locked into place.  Ask someone to hold it for you, if possible.  Clearly mark and make sure that you can see:  Any grab bars or handrails.  First and last steps.  Where the edge of each step is.  Use tools that help you move around (mobility aids) if they are needed. These include:  Canes.  Walkers.  Scooters.  Crutches.  Turn on the lights when you go into a dark area. Replace any light bulbs as soon as they burn out.  Set up your furniture so you have a clear path. Avoid moving your furniture around.  If any of your floors are uneven, fix them.  If there are any pets around you, be aware of where they are.  Review your medicines with your doctor. Some medicines can make you feel dizzy. This can increase your chance of falling. Ask your doctor what other things that you can do to help prevent falls. This information is not intended to replace advice given to you by your health care provider. Make sure you discuss any questions you have with your health care provider. Document Released: 02/28/2009 Document Revised: 10/10/2015 Document Reviewed: 06/08/2014 Elsevier Interactive Patient Education  2017 [employer].

## 2019-12-25 NOTE — Progress Notes (Signed)
Subjective:   Kylie Davis is a 69 y.o. female who presents for Medicare Annual (Subsequent) preventive examination.  I connected with Letitia Caul today by telephone and verified that I am speaking with the correct person using two identifiers. Location patient: home Location provider: work Persons participating in the virtual visit: patient, provider.   I discussed the limitations, risks, security and privacy concerns of performing an evaluation and management service by telephone and the availability of in person appointments. I also discussed with the patient that there may be a patient responsible charge related to this service. The patient expressed understanding and verbally consented to this telephonic visit.    Interactive audio and video telecommunications were attempted between this provider and patient, however failed, due to patient having technical difficulties OR patient did not have access to video capability.  We continued and completed visit with audio only.      Review of Systems    N/A Cardiac Risk Factors include: advanced age (>65men, >19 women);hypertension     Objective:    Today's Vitals   12/25/19 1024  PainSc: 4    There is no height or weight on file to calculate BMI.  Advanced Directives 12/25/2019 03/03/2017 04/02/2014  Does Patient Have a Medical Advance Directive? Yes Yes Yes  Type of Paramedic of [place];Living will - [place]  Does patient want to make changes to medical advance directive? No - Patient declined - -  Copy of [place] in Chart? No - copy requested - -    Current Medications (verified) Outpatient Encounter Medications as of 12/25/2019  Medication Sig  . aspirin 81 MG tablet Take 81 mg by mouth daily.    . calcium-vitamin D (SM CALCIUM 500/VITAMIN D3) 500-400 MG-UNIT per tablet Take 1 tablet by mouth daily.   Marland Kitchen losartan-hydrochlorothiazide (HYZAAR) 50-12.5  MG tablet TAKE 1 TABLET BY MOUTH DAILY  . Multiple Vitamins-Minerals (MULTIVITAMIN PO) Take by mouth.  . [DISCONTINUED] losartan (COZAAR) 50 MG tablet TAKE 1 TABLET(50 MG) BY MOUTH DAILY  . cyclobenzaprine (FLEXERIL) 10 MG tablet Take 1 tablet (10 mg total) by mouth 3 (three) times daily as needed for muscle spasms. (Patient not taking: Reported on 12/25/2019)  . [DISCONTINUED] predniSONE (DELTASONE) 10 MG tablet 40 mg x 3 days, 20 mg x 3 days, 10 mg x 3 days   No facility-administered encounter medications on file as of 12/25/2019.    Allergies (verified) Patient has no known allergies.   History: Past Medical History:  Diagnosis Date  . Hypertension   . Sleep apnea    C PAP   Past Surgical History:  Procedure Laterality Date  . CHOLECYSTECTOMY  2004   Family History  Problem Relation Age of Onset  . Cancer Mother 23       primary peritoneal cancer  . Alzheimer's disease Mother   . Cancer Father 66       stomach or pancreatic  . Heart disease Father   . Hypertension Brother   . Heart attack Brother 67       MI x 2  . Diabetes Paternal Grandmother   . Breast cancer Paternal Aunt 13  . Colon cancer Neg Hx    Social History   Socioeconomic History  . Marital status: Married    Spouse name: Not on file  . Number of children: Not on file  . Years of education: Not on file  . Highest education level: Not on file  Occupational History  . Occupation: retired Product manager: RETIRED  Tobacco Use  . Smoking status: Never Smoker  . Smokeless tobacco: Never Used  Substance and Sexual Activity  . Alcohol use: Yes    Alcohol/week: 4.0 standard drinks    Types: 4 Standard drinks or equivalent per week    Comment: weekly  . Drug use: No  . Sexual activity: Yes    Birth control/protection: Post-menopausal    Comment: 1st intercourse 69 yo.---Fewer than 5 partners  Other Topics Concern  . Not on file  Social History Narrative   Retired from being a Education officer, museum.     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 years    0 children    Social Determinants of Health   Financial Resource Strain: Low Risk   . Difficulty of Paying Living Expenses: Not hard at all  Food Insecurity: No Food Insecurity  . Worried About Charity fundraiser in the Last Year: Never true  . Ran Out of Food in the Last Year: Never true  Transportation Needs: No Transportation Needs  . Lack of Transportation (Medical): No  . Lack of Transportation (Non-Medical): No  Physical Activity: Inactive  . Days of Exercise per Week: 0 days  . Minutes of Exercise per Session: 0 min  Stress: No Stress Concern Present  . Feeling of Stress : Not at all  Social Connections: Moderately Integrated  . Frequency of Communication with Friends and Family: More than three times a week  . Frequency of Social Gatherings with Friends and Family: Twice a week  . Attends Religious Services: More than 4 times per year  . Active Member of Clubs or Organizations: No  . Attends Archivist Meetings: Never  . Marital Status: Married    Tobacco Counseling Counseling given: Not Answered   Clinical Intake:  Pre-visit preparation completed: Yes  Pain : 0-10 Pain Score: 4  Pain Type: Chronic pain Pain Location: Leg (hip) Pain Orientation: Right Pain Descriptors / Indicators: Discomfort Pain Onset: More than a month ago Pain Frequency: Constant Pain Relieving Factors: Ibuprofen  Pain Relieving Factors: Ibuprofen  Nutritional Risks: None Diabetes: No  How often do you need to have someone help you when you read instructions, pamphlets, or other written materials from your doctor or pharmacy?: 1 - Never What is the last grade level you completed in school?: Masters Degree  Diabetic?No  Interpreter Needed?: No  Information entered by :: [place] of Daily Living In your present state of health, do you have any difficulty performing the following activities: 12/25/2019  Hearing? N  Vision? N    Difficulty concentrating or making decisions? N  Walking or climbing stairs? N  Dressing or bathing? N  Doing errands, shopping? N  Preparing Food and eating ? N  Using the Toilet? N  In the past six months, have you accidently leaked urine? N  Do you have problems with loss of bowel control? N  Managing your Medications? N  Managing your Finances? N  Housekeeping or managing your Housekeeping? N  Some recent data might be hidden    Patient Care Team: [name], NP as PCP - General (Family Medicine)  Indicate any recent Medical Services you may have received from other than [facility] providers in the past year (date may be approximate).     Assessment:   This is a routine wellness examination for Lonita.  Hearing/Vision screen  Hearing Screening   125Hz  250Hz  500Hz  1000Hz  2000Hz  3000Hz   4000Hz  6000Hz  8000Hz   Right ear:           Left ear:           Vision Screening Comments: Patient states gets eye examined yearly   Dietary issues and exercise activities discussed: Current Exercise Habits: The patient does not participate in regular exercise at present  Goals    . Weight (lb) < 200 lb (90.7 kg)     Go talk to your friend and buddy up to lose weight       Depression Screen PHQ 2/9 Scores 12/25/2019 03/08/2018 03/03/2017 02/28/2015 04/02/2014  PHQ - 2 Score 0 0 0 0 0  PHQ- 9 Score 0 - - - -    Fall Risk Fall Risk  12/25/2019 03/08/2018 03/03/2017 02/28/2015 04/02/2014  Falls in the past year? 0 No No No No  Number falls in past yr: 0 - - - -  Injury with Fall? 0 - - - -  Risk for fall due to : Medication side effect;Orthopedic patient - - - -  Follow up Falls evaluation completed;Falls prevention discussed - - - -    Any stairs in or around the home? No  If so, are there any without handrails? No  Home free of loose throw rugs in walkways, pet beds, electrical cords, etc? Yes  Adequate lighting in your home to reduce risk of falls? Yes   ASSISTIVE DEVICES UTILIZED  TO PREVENT FALLS:  Life alert? No  Use of a cane, walker or w/c? No  Grab bars in the bathroom? No  Shower chair or bench in shower? No  Elevated toilet seat or a handicapped toilet? No    Cognitive Function: Cognitive screening not indicated based on direct observation.        Immunizations Immunization History  Administered Date(s) Administered  . Influenza Split 02/25/2012, 03/18/2013, 02/02/2014  . Influenza, High Dose Seasonal PF 02/28/2015, 02/20/2016, 03/03/2017, 03/08/2018, 03/17/2019  . Moderna SARS-COVID-2 Vaccination 07/22/2019, 08/22/2019  . Pneumococcal Conjugate-13 02/28/2015  . Pneumococcal Polysaccharide-23 02/20/2016  . Td 05/19/2003  . Tdap 09/13/2013  . Zoster 12/15/2010  . Zoster Recombinat (Shingrix) 06/25/2018, 11/06/2018    TDAP status: Up to date Flu Vaccine status: Up to date Pneumococcal vaccine status: Up to date Covid-19 vaccine status: Completed vaccines  Qualifies for Shingles Vaccine? Yes   Zostavax completed Yes   Shingrix Completed?: Yes  Screening Tests Health Maintenance  Topic Date Due  . OPHTHALMOLOGY EXAM  Never done  . INFLUENZA VACCINE  12/17/2019  . HEMOGLOBIN A1C  03/08/2020  . MAMMOGRAM  04/18/2021  . COLONOSCOPY  02/25/2023  . TETANUS/TDAP  09/14/2023  . DEXA SCAN  Completed  . COVID-19 Vaccine  Completed  . Hepatitis C Screening  Completed  . PNA vac Low Risk Adult  Completed  . FOOT EXAM  Discontinued    Health Maintenance  Health Maintenance Due  Topic Date Due  . OPHTHALMOLOGY EXAM  Never done  . INFLUENZA VACCINE  12/17/2019    Colorectal cancer screening: Completed 02/24/2013. Repeat every 10 years Mammogram status: Completed 04/19/2019. Repeat every year Bone Density status: Completed 04/21/2016. Results reflect: Bone density results: NORMAL. Repeat every 2 years.  Lung Cancer Screening: (Low Dose CT Chest recommended if Age 75-80 years, 30 pack-year currently smoking OR have quit w/in 15years.) does  not qualify.   Lung Cancer Screening Referral: N/A  Additional Screening:  Hepatitis C Screening: does qualify; Completed 03/03/2017   Vision Screening: Recommended annual ophthalmology exams for early detection  of glaucoma and other disorders of the eye. Is the patient up to date with their annual eye exam?  Yes  Who is the provider or what is the name of the office in which the patient attends annual eye exams? [place]  If pt is not established with a provider, would they like to be referred to a provider to establish care? No .   Dental Screening: Recommended annual dental exams for proper oral hygiene  Community Resource Referral / Chronic Care Management: CRR required this visit?  No   CCM required this visit?  No      Plan:     I have personally reviewed and noted the following in the patient's chart:   . Medical and social history . Use of alcohol, tobacco or illicit drugs  . Current medications and supplements . Functional ability and status . Nutritional status . Physical activity . Advanced directives . List of other physicians . Hospitalizations, surgeries, and ER visits in previous 12 months . Vitals . Screenings to include cognitive, depression, and falls . Referrals and appointments  In addition, I have reviewed and discussed with patient certain preventive protocols, quality metrics, and best practice recommendations. A written personalized care plan for preventive services as well as general preventive health recommendations were provided to patient.     Ofilia Neas, LPN   05/24/9148   Nurse Notes: None

## 2020-01-08 ENCOUNTER — Telehealth: Payer: Self-pay | Admitting: Adult Health

## 2020-01-08 NOTE — Telephone Encounter (Signed)
Pt would like a call back stating what the MRI order included? She said they thought it was her back but then they discussed it might be her hip so she wants to be sure it is including what they discussed.   Pt can be reached at [phone] -ok to leave detailed message per pt

## 2020-01-09 NOTE — Telephone Encounter (Signed)
Left message on machine for patient to return our call 

## 2020-01-09 NOTE — Telephone Encounter (Signed)
MRI is of lumbar spine

## 2020-01-10 ENCOUNTER — Other Ambulatory Visit: Payer: Self-pay | Admitting: Adult Health

## 2020-01-10 DIAGNOSIS — M545 Low back pain, unspecified: Secondary | ICD-10-CM

## 2020-01-10 NOTE — Telephone Encounter (Signed)
Patient notified and verbalized understanding. Patient stated that it okay, she wanted them added to the MRI.

## 2020-01-10 NOTE — Telephone Encounter (Signed)
I can try adding them but she should know before I do so that insurance may not pay for it and it could delay the MRI of lumbar spine since it will have to be approved by her insurance first.

## 2020-01-10 NOTE — Telephone Encounter (Signed)
Spoke with patient and she stated that she was told by a doctor that he believes the pain is coming from her hip and radiating down right knee. Patient would like to know if MRI can include hip and right knee. Please advise.

## 2020-01-16 ENCOUNTER — Other Ambulatory Visit: Payer: Medicare PPO

## 2020-01-31 ENCOUNTER — Other Ambulatory Visit: Payer: Self-pay

## 2020-01-31 ENCOUNTER — Ambulatory Visit: Payer: Medicare PPO | Attending: Adult Health | Admitting: Physical Therapy

## 2020-01-31 ENCOUNTER — Encounter: Payer: Self-pay | Admitting: Physical Therapy

## 2020-01-31 ENCOUNTER — Other Ambulatory Visit: Payer: Self-pay | Admitting: Adult Health

## 2020-01-31 DIAGNOSIS — M25551 Pain in right hip: Secondary | ICD-10-CM | POA: Diagnosis present

## 2020-01-31 DIAGNOSIS — M25652 Stiffness of left hip, not elsewhere classified: Secondary | ICD-10-CM | POA: Insufficient documentation

## 2020-01-31 DIAGNOSIS — M5441 Lumbago with sciatica, right side: Secondary | ICD-10-CM
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
## 2020-01-31 NOTE — Patient Instructions (Signed)
Access Code: [email or web address]: [email or web address]: 09/15/2021Prepared by: [place]  Supine bridge off the edge of couch - 1 x daily - 7 x weekly - 3 sets - 10 reps  Supine Hip Internal and External Rotation - 2 x daily - 7 x weekly - 1 sets - 10 reps   [facility] [address], [place], [place] [address] Phone # [phone] Fax [phone]

## 2020-02-04 ENCOUNTER — Ambulatory Visit
Admission: RE | Admit: 2020-02-04 | Discharge: 2020-02-04 | Disposition: A | Discharge status: Home / Self Care | Payer: Medicare PPO | Source: Ambulatory Visit | Attending: Adult Health | Admitting: Adult Health

## 2020-02-04 ENCOUNTER — Other Ambulatory Visit: Payer: Self-pay

## 2020-02-04 DIAGNOSIS — M545 Low back pain, unspecified: Secondary | ICD-10-CM

## 2020-02-05 ENCOUNTER — Other Ambulatory Visit: Payer: Medicare PPO

## 2020-02-06 ENCOUNTER — Telehealth: Payer: Self-pay | Admitting: Adult Health

## 2020-02-06 DIAGNOSIS — S83282A Other tear of lateral meniscus, current injury, left knee, initial encounter: Secondary | ICD-10-CM

## 2020-02-06 NOTE — Telephone Encounter (Signed)
Spoke to patient informed her of her MRI results  MRI of right knee shows   IMPRESSION: 1. Small radial tear of the lateral meniscus midbody. 2. Mild medial and lateral compartment osteoarthritis.   MRI of right hip shows IMPRESSION: 1. Moderate right hip osteoarthritis. 2. Small right hip joint effusion with synovitis. 3. Moderate bilateral sacroiliac joint arthropathy.  Will refer to orthopedics for further evaluation

## 2020-02-07 ENCOUNTER — Ambulatory Visit: Payer: Medicare PPO

## 2020-02-21 ENCOUNTER — Ambulatory Visit: Payer: Medicare PPO | Attending: Adult Health | Admitting: Physical Therapy

## 2020-02-21 ENCOUNTER — Other Ambulatory Visit: Payer: Self-pay

## 2020-02-21 ENCOUNTER — Encounter: Payer: Self-pay | Admitting: Physical Therapy

## 2020-02-21 DIAGNOSIS — M5441 Lumbago with sciatica, right side: Secondary | ICD-10-CM | POA: Diagnosis not present

## 2020-02-21 DIAGNOSIS — M25551 Pain in right hip: Secondary | ICD-10-CM

## 2020-02-21 DIAGNOSIS — M25652 Stiffness of left hip, not elsewhere classified: Secondary | ICD-10-CM | POA: Diagnosis not present

## 2020-02-21 NOTE — Therapy (Signed)
[facility] [address], [place], [place], [address] Phone: [phone]   Fax:  [phone]  Physical Therapy Treatment  Patient Details  Name: Kylie Davis MRN: [id number] Date of Birth: 1950/05/30 Referring Provider (PT): [name], NP    Encounter Date: 02/21/2020   PT End of Session - 02/21/20 1054    Visit Number 2    Date for PT Re-Evaluation 04/01/20    Authorization Type [employer]    Authorization Time Period 01/31/20 to 04/01/20    Authorization - Number of Visits 8    PT Start Time 1055    PT Stop Time 1140    PT Time Calculation (min) 45 min    Activity Tolerance Patient tolerated treatment well    Behavior During Therapy [facility] for tasks assessed/performed           Past Medical History:  Diagnosis Date  . Hypertension   . Sleep apnea    C PAP    Past Surgical History:  Procedure Laterality Date  . CHOLECYSTECTOMY  2004    There were no vitals filed for this visit.   Subjective Assessment - 02/21/20 1057    Subjective Pt had MRI: moderate hip OA, joint effusion, SI arthropathy. Having injection next Wednesday.    Currently in Pain? Yes    Pain Score 3     Pain Location Hip    Pain Orientation Medial;Lateral    Pain Descriptors / Indicators --   quick jab   Aggravating Factors  walking    Pain Relieving Factors being in hot tub, anything warm    Multiple Pain Sites No                             [facility] Adult PT Treatment/Exercise - 02/21/20 0001      Knee/Hip Exercises: Stretches   Other Knee/Hip Stretches Hip flexor stretch leg on/off bed for progression. Pt demo both coreectly       Knee/Hip Exercises: Aerobic   Recumbent Bike tried 1 min bc she is thinking of going to gym to do on her  own.     Nustep L1x  6 min      Knee/Hip Exercises: Supine   Bridges AROM;Strengthening;Both;1 set;10 reps    Bridges Limitations VC to slow ex down    Other Supine  Knee/Hip Exercises gentle hip IR/ER x5 reps- pt denies pain with this    TC to move feet out wide                   PT Short Term Goals - 01/31/20 1312      PT SHORT TERM GOAL #1   Title Pt will be independent with her initial HEP to increase ROM and activity tolerance.    Time 4    Period Weeks    Status New    Target Date 03/01/20             PT Long Term Goals - 01/31/20 1312      PT LONG TERM GOAL #1   Title Pt will have 5/5 MMT strength of Rt LE to improve her efficency with daily activity.    Time 8    Period Weeks    Status New      PT LONG TERM GOAL #2   Title Pt will have atleast 10 deg improvement in hip external and internal rotation to improve her mechanics  with daily activity.    Time 8    Period Weeks    Status New      PT LONG TERM GOAL #3   Title Pt will report atleast 50% improvement in her pain with daily activity and donning her shoes.    Time 8    Period Weeks    Status New      PT LONG TERM GOAL #4   Title Pt will be able to go on her usual neighborhood walk without increase in Rt hip pain.    Time 8    Period Weeks    Status New                 Plan - 02/21/20 1055    Clinical Impression Statement Pt compliant and independent in initial HEP. PTA encouraged pt to hold her bridges 3-5 sec to increase her hip stretch. PTA also progressed RT hip flexor stretch for HEP. Pt experimented with a few different positions with leg on/off the mat table to see which stretch she was most comfortable with. Pt liked hanging her leg off the bed using a stool to support her Rt foot. Pt also felt good performing the Nustep. She is thinking of going to her gym on her own to ride either the recumbent bike or the Nustep. She had no pain with either machine.    Personal Factors and Comorbidities Age;Fitness    Examination-Activity Limitations Stairs;Stand;Locomotion Level;Dressing    Stability/Clinical Decision Making Stable/Uncomplicated    Rehab  Potential Good    PT Frequency 1x / week    PT Duration 8 weeks    PT Treatment/Interventions ADLs/Self Care Home Management;Cryotherapy;Electrical Stimulation;Moist Heat;Therapeutic exercise;Therapeutic activities;Neuromuscular re-education;Manual techniques;Dry needling;Patient/family education    PT Next Visit Plan Pt to get hip injection next Wednesday, follow up with whether or not pt went to gym or not, contiue with gentle hip ROM and progressive strength.    PT Home Exercise Plan [email or web address]    Consulted and Agree with Plan of Care Patient           Patient will benefit from skilled therapeutic intervention in order to improve the following deficits and impairments:  Pain, Increased muscle spasms, Decreased mobility, Hypomobility, Decreased strength, Decreased range of motion, Impaired flexibility, Difficulty walking  Visit Diagnosis: Acute right-sided low back pain with right-sided sciatica  Pain in right hip  Stiffness of left hip, not elsewhere classified     Problem List Patient Active Problem List   Diagnosis Date Noted  . Obesity 09/10/2015  . Family history of malignant neoplasm of ovary 01/18/2014  . Family history of malignant neoplasm of breast 01/18/2014  . Family history of malignant neoplasm of gastrointestinal tract 01/18/2014  . OSA (obstructive sleep apnea) 10/10/2013  . Sleep apnea 09/13/2013  . Allergic rhinitis 09/06/2012  . Essential hypertension, benign 09/06/2012    Pawel Soules, PTA 02/21/2020, 11:43 AM  [facility] [address] [place], [place], [address] Phone: [phone]   Fax:  [phone]  Name: Kylie Davis MRN: [id number] Date of Birth: 02-09-1951

## 2020-02-22 ENCOUNTER — Other Ambulatory Visit: Payer: Self-pay | Admitting: Adult Health

## 2020-02-27 ENCOUNTER — Other Ambulatory Visit: Payer: Self-pay

## 2020-02-27 ENCOUNTER — Ambulatory Visit: Payer: Medicare PPO

## 2020-02-27 DIAGNOSIS — M25652 Stiffness of left hip, not elsewhere classified: Secondary | ICD-10-CM | POA: Diagnosis not present

## 2020-02-27 DIAGNOSIS — M25551 Pain in right hip: Secondary | ICD-10-CM

## 2020-02-27 DIAGNOSIS — M5441 Lumbago with sciatica, right side: Secondary | ICD-10-CM

## 2020-02-27 NOTE — Patient Instructions (Signed)
Access Code: [email or web address] URL: [email or web address] Date: 02/27/2020 Prepared by: Claiborne Billings  Exercises  Hip Flexor Stretch on Step - 1 x daily - 7 x weekly - 3 sets - 10 reps

## 2020-02-27 NOTE — Therapy (Signed)
[facility] [address], [place], [place], [address] Phone: [phone]   Fax:  [phone]  Physical Therapy Treatment  Patient Details  Name: Latricia Cerrito MRN: [id number] Date of Birth: September 03, 1950 Referring Provider (PT): [name], NP    Encounter Date: 02/27/2020   PT End of Session - 02/27/20 1058    Visit Number 3    Date for PT Re-Evaluation 04/01/20    Authorization Type Hum[employer]    Authorization - Number of Visits 8    PT Start Time 1017    PT Stop Time 1104    PT Time Calculation (min) 47 min    Activity Tolerance Patient tolerated treatment well    Behavior During Therapy [facility] for tasks assessed/performed           Past Medical History:  Diagnosis Date  . Hypertension   . Sleep apnea    C PAP    Past Surgical History:  Procedure Laterality Date  . CHOLECYSTECTOMY  2004    There were no vitals filed for this visit.   Subjective Assessment - 02/27/20 1021    Subjective My pain is overall better.  I didn't do my exercises this morning because I was very busy.    Currently in Pain? No/denies   up to 2-3/10 with walking   Pain Location Hip    Pain Orientation Lateral;Medial    Pain Type Chronic pain    Pain Onset More than a month ago    Pain Frequency Intermittent    Aggravating Factors  walking    Pain Relieving Factors hot tub, heat                             [facility] Adult PT Treatment/Exercise - 02/27/20 0001      Knee/Hip Exercises: Stretches   Other Knee/Hip Stretches hip flexor stretch on step 3x 20 seconds       Knee/Hip Exercises: Aerobic   Nustep L 8 x  8  min      Knee/Hip Exercises: Supine   Bridges AROM;Strengthening;Both;10 reps;2 sets    Other Supine Knee/Hip Exercises gentle hip IR/ER x5 reps- pt denies pain with this    TC to move feet out wide     Manual Therapy   Manual Therapy Soft tissue mobilization;Myofascial release    Manual  therapy comments Addaday assisted to Rt hip flexor, lateral hip  and proximal quad                  PT Education - 02/27/20 1050    Education Details Access Code: [place], addaday information    Person(s) Educated Patient    Methods Explanation;Demonstration;Handout    Comprehension Verbalized understanding;Returned demonstration            PT Short Term Goals - 01/31/20 1312      PT SHORT TERM GOAL #1   Title Pt will be independent with her initial HEP to increase ROM and activity tolerance.    Time 4    Period Weeks    Status New    Target Date 03/01/20             PT Long Term Goals - 01/31/20 1312      PT LONG TERM GOAL #1   Title Pt will have 5/5 MMT strength of Rt LE to improve her efficency with daily activity.    Time 8  Period Weeks    Status New      PT LONG TERM GOAL #2   Title Pt will have atleast 10 deg improvement in hip external and internal rotation to improve her mechanics with daily activity.    Time 8    Period Weeks    Status New      PT LONG TERM GOAL #3   Title Pt will report atleast 50% improvement in her pain with daily activity and donning her shoes.    Time 8    Period Weeks    Status New      PT LONG TERM GOAL #4   Title Pt will be able to go on her usual neighborhood walk without increase in Rt hip pain.    Time 8    Period Weeks    Status New                 Plan - 02/27/20 1116    Clinical Impression Statement Pt continues to report Rt anterior hip pain that is intermittent with walking.  Session focused on hip extension stretch and hip strength and mobility.  Pt had hip flexor pain with attempt of Thomas stretch today.  Pt responded well to Addaday for tissue mobilization.  Pt required minor tactile cues for technique with bridge and hip rotation stretch.  Pt will continue to benefit from skilled PT to address Rt hip flexor and LE pain.    PT Frequency 1x / week    PT Duration 8 weeks    PT  Treatment/Interventions ADLs/Self Care Home Management;Cryotherapy;Electrical Stimulation;Moist Heat;Therapeutic exercise;Therapeutic activities;Neuromuscular re-education;Manual techniques;Dry needling;Patient/family education    PT Next Visit Plan see how injection goes.  Work on [employer] hip flexibility and strength    PT Home Exercise Plan [email or web address]    Consulted and Agree with Plan of Care Patient           Patient will benefit from skilled therapeutic intervention in order to improve the following deficits and impairments:  Pain, Increased muscle spasms, Decreased mobility, Hypomobility, Decreased strength, Decreased range of motion, Impaired flexibility, Difficulty walking  Visit Diagnosis: Pain in right hip  Acute right-sided low back pain with right-sided sciatica  Stiffness of left hip, not elsewhere classified     Problem List Patient Active Problem List   Diagnosis Date Noted  . Obesity 09/10/2015  . Family history of malignant neoplasm of ovary 01/18/2014  . Family history of malignant neoplasm of breast 01/18/2014  . Family history of malignant neoplasm of gastrointestinal tract 01/18/2014  . OSA (obstructive sleep apnea) 10/10/2013  . Sleep apnea 09/13/2013  . Allergic rhinitis 09/06/2012  . Essential hypertension, benign 09/06/2012     Sigurd Sos, PT 02/27/20 11:19 AM  [facility] [address] [place], [place], [address] Phone: [phone]   Fax:  [phone]  Name: Zula Hovsepian MRN: [id number] Date of Birth: 1951-02-24

## 2020-02-28 DIAGNOSIS — M25551 Pain in right hip: Secondary | ICD-10-CM | POA: Diagnosis not present

## 2020-03-07 ENCOUNTER — Ambulatory Visit: Payer: Medicare PPO

## 2020-03-07 ENCOUNTER — Other Ambulatory Visit: Payer: Self-pay

## 2020-03-07 DIAGNOSIS — M25652 Stiffness of left hip, not elsewhere classified: Secondary | ICD-10-CM | POA: Diagnosis not present

## 2020-03-07 DIAGNOSIS — M5441 Lumbago with sciatica, right side: Secondary | ICD-10-CM

## 2020-03-07 DIAGNOSIS — M25551 Pain in right hip: Secondary | ICD-10-CM

## 2020-03-07 NOTE — Patient Instructions (Signed)
Access Code: [email or web address] URL: [email or web address] Date: 03/07/2020 Prepared by: Claiborne Billings  Exercises  Clamshell - 1 x daily - 7 x weekly - 2 sets - 10 reps Supine Hip External Rotation AAROM - 2 x daily - 7 x weekly - 1 sets - 3 reps - 20 hold Supine Butterfly Groin Stretch - 2 x daily - 7 x weekly - 1 sets - 3 reps - 20 hold

## 2020-03-07 NOTE — Therapy (Signed)
[facility] [address] [place], [place], [address] Phone: [phone]   Fax:  [phone]  Physical Therapy Treatment  Patient Details  Name: Kylie Davis MRN: [id number] Date of Birth: 06/29/1950 Referring Provider (PT): [name], NP    Encounter Date: 03/07/2020   PT End of Session - 03/07/20 1146    Visit Number 4    Date for PT Re-Evaluation 04/01/20    Authorization Type [employer]: 12 visits 9/15-11/15    Authorization - Visit Number 4    Authorization - Number of Visits 12    Progress Note Due on Visit 10    PT Start Time 1102    PT Stop Time 1146    PT Time Calculation (min) 44 min    Activity Tolerance Patient tolerated treatment well    Behavior During Therapy [facility] for tasks assessed/performed           Past Medical History:  Diagnosis Date  . Hypertension   . Sleep apnea    C PAP    Past Surgical History:  Procedure Laterality Date  . CHOLECYSTECTOMY  2004    There were no vitals filed for this visit.   Subjective Assessment - 03/07/20 1101    Subjective I had an injection into my Rt hip on 02/28/20.  It helped but wasn't miraculous.    Pain Location Hip    Pain Orientation Anterior    Pain Type Chronic pain    Pain Onset More than a month ago    Pain Frequency Intermittent    Aggravating Factors  walking, activity    Pain Relieving Factors hot tub, heat                             [facility] Adult PT Treatment/Exercise - 03/07/20 0001      Knee/Hip Exercises: Stretches   Quad Stretch Limitations hip ER stretch in supine with overpressure 3x20 seconds    Other Knee/Hip Stretches hip flexor stretch on step 3x 20 seconds     Other Knee/Hip Stretches supine butterfly 3x20 seconds       Knee/Hip Exercises: Aerobic   Nustep L 3  x  8  min      Knee/Hip Exercises: Supine   Bridges AROM;Strengthening;Both;10 reps;2 sets      Knee/Hip Exercises: Sidelying    Clams 2x10      Manual Therapy   Manual Therapy Soft tissue mobilization;Myofascial release    Manual therapy comments Addaday assisted to Rt hip flexor, lateral hip  and proximal quad   PT showed pt how to do at home for tissue mobility                 PT Education - 03/07/20 1128    Education Details Access Code: [email or web address]    Person(s) Educated Patient    Methods Explanation;Demonstration;Handout    Comprehension Verbalized understanding;Returned demonstration            PT Short Term Goals - 03/07/20 1106      PT SHORT TERM GOAL #1   Title Pt will be independent with her initial HEP to increase ROM and activity tolerance.    Status Achieved             PT Long Term Goals - 03/07/20 1106      PT LONG TERM GOAL #3   Title Pt will report atleast 50% improvement in her pain with  daily activity and donning her shoes.    Baseline 20%    Time 8    Period Weeks    Status On-going                 Plan - 03/07/20 1120    Clinical Impression Statement Pt reports 20-30% overall improvement in Rt hip pain and mobility since the start of care.  Pt reports 20% increased ease with putting on shoes/socks.  Pt continues to demonstrate limited Rt hip ER needed for this motion.  Session focused on hip flexibility, strength and mobility.  Pt required some tactile cueing for technique to reduce substitution with exercise.  Pt responded well to Addaday for tissue mobilization.  Pt will continue to benefit from skilled PT to address Rt hip flexor and LE pain.    PT Frequency 1x / week    PT Duration 8 weeks    PT Treatment/Interventions ADLs/Self Care Home Management;Cryotherapy;Electrical Stimulation;Moist Heat;Therapeutic exercise;Therapeutic activities;Neuromuscular re-education;Manual techniques;Dry needling;Patient/family education    PT Next Visit Plan Work on Rt hip flexibility and strength    PT Home Exercise Plan [email or web address]    Recommended Other Services initial cert is  signed    Consulted and Agree with Plan of Care Patient           Patient will benefit from skilled therapeutic intervention in order to improve the following deficits and impairments:  Pain, Increased muscle spasms, Decreased mobility, Hypomobility, Decreased strength, Decreased range of motion, Impaired flexibility, Difficulty walking  Visit Diagnosis: Pain in right hip  Acute right-sided low back pain with right-sided sciatica  Stiffness of left hip, not elsewhere classified     Problem List Patient Active Problem List   Diagnosis Date Noted  . Obesity 09/10/2015  . Family history of malignant neoplasm of ovary 01/18/2014  . Family history of malignant neoplasm of breast 01/18/2014  . Family history of malignant neoplasm of gastrointestinal tract 01/18/2014  . OSA (obstructive sleep apnea) 10/10/2013  . Sleep apnea 09/13/2013  . Allergic rhinitis 09/06/2012  . Essential hypertension, benign 09/06/2012    Sigurd Sos, PT 03/07/20 11:48 AM  [facility] [address] [place], [place], [address] Phone: [phone]   Fax:  [phone]  Name: Kylie Davis MRN: [id number] Date of Birth: 04-Oct-1950

## 2020-03-08 ENCOUNTER — Encounter: Payer: Medicare PPO | Admitting: Physical Therapy

## 2020-03-13 ENCOUNTER — Encounter: Payer: Self-pay | Admitting: Physical Therapy

## 2020-03-13 ENCOUNTER — Ambulatory Visit: Payer: Medicare PPO | Admitting: Physical Therapy

## 2020-03-13 ENCOUNTER — Other Ambulatory Visit: Payer: Self-pay

## 2020-03-13 DIAGNOSIS — M25551 Pain in right hip: Secondary | ICD-10-CM

## 2020-03-13 DIAGNOSIS — M25652 Stiffness of left hip, not elsewhere classified: Secondary | ICD-10-CM | POA: Diagnosis not present

## 2020-03-13 DIAGNOSIS — M5441 Lumbago with sciatica, right side: Secondary | ICD-10-CM

## 2020-03-13 NOTE — Therapy (Signed)
[facility] [address] [place], [place], [address] Phone: [phone]   Fax:  [phone]  Physical Therapy Treatment  Patient Details  Name: Kylie Davis MRN: [id number] Date of Birth: May 08, 1951 Referring Provider (PT): [name], NP    Encounter Date: 03/13/2020   PT End of Session - 03/13/20 1102    Visit Number 5    Date for PT Re-Evaluation 04/01/20    Authorization Type [employer]: 12 visits 9/15-11/15    Authorization Time Period 01/31/20 to 04/01/20    Authorization - Visit Number 5    Authorization - Number of Visits 12    Progress Note Due on Visit 10    PT Start Time 1100    PT Stop Time 1141    PT Time Calculation (min) 41 min    Activity Tolerance Patient tolerated treatment well    Behavior During Therapy [facility] for tasks assessed/performed           Past Medical History:  Diagnosis Date  . Hypertension   . Sleep apnea    C PAP    Past Surgical History:  Procedure Laterality Date  . CHOLECYSTECTOMY  2004    There were no vitals filed for this visit.   Subjective Assessment - 03/13/20 1103    Subjective I am doing stairs much better. Still cannot get my sock on but I will get will    Limitations Sitting;Walking    Currently in Pain? No/denies    Multiple Pain Sites No              [facility] PT Assessment - 03/13/20 0001      PROM   Overall PROM Comments RT hip ER 35 deg passive: Hip IR 11 deg                         [facility] Adult PT Treatment/Exercise - 03/13/20 0001      Knee/Hip Exercises: Stretches   Other Knee/Hip Stretches Seated ER stretch with towel for assiatnce 4x 10 sec      Knee/Hip Exercises: Aerobic   Nustep L3 x 10 min with PTA present to discuss status.      Knee/Hip Exercises: Supine   Other Supine Knee/Hip Exercises Hip ER series with black spikey ball: Too painful to contiinue: tried purple soft ball and pt did much better.      Manual  Therapy   Manual Therapy Soft tissue mobilization;Myofascial release    Manual therapy comments Addaday assisted to Rt hip flexor, lateral hip  and proximal quad   PT showed pt how to do at home for tissue mobility                 PT Education - 03/13/20 1224    Education Details HEP    Person(s) Educated Patient    Methods Explanation;Demonstration;Verbal cues    Comprehension Returned demonstration;Verbalized understanding            PT Short Term Goals - 03/07/20 1106      PT SHORT TERM GOAL #1   Title Pt will be independent with her initial HEP to increase ROM and activity tolerance.    Status Achieved             PT Long Term Goals - 03/07/20 1106      PT LONG TERM GOAL #3   Title Pt will report atleast 50% improvement in her pain with daily activity and  donning her shoes.    Baseline 20%    Time 8    Period Weeks    Status On-going                 Plan - 03/13/20 1103    Clinical Impression Statement Pt increased RT hip ER passively by 35 degrees and an insignificant amount in internal rotation. Pt reports she does notices she can negotiate stairs better but still has a very difficult time getting Rt shoe, or cannot do it at all. Had printer issues so pt was not able to get a picture of the supine IR AROM for Bil hips. Pt is going to use a towel to assist her seated hip ER, again no picture given.    Personal Factors and Comorbidities Age;Fitness    Examination-Activity Limitations Stairs;Stand;Locomotion Level;Dressing    Stability/Clinical Decision Making Stable/Uncomplicated    Rehab Potential Good    PT Frequency 1x / week    PT Duration 8 weeks    PT Treatment/Interventions ADLs/Self Care Home Management;Cryotherapy;Electrical Stimulation;Moist Heat;Therapeutic exercise;Therapeutic activities;Neuromuscular re-education;Manual techniques;Dry needling;Patient/family education    PT Next Visit Plan printer would not work so pt may need copies of  hip IR stretch, otherwise  cont to work on RT hip ER/IR    PT Parker and Agree with Plan of Care Patient           Patient will benefit from skilled therapeutic intervention in order to improve the following deficits and impairments:  Pain, Increased muscle spasms, Decreased mobility, Hypomobility, Decreased strength, Decreased range of motion, Impaired flexibility, Difficulty walking  Visit Diagnosis: Pain in right hip  Acute right-sided low back pain with right-sided sciatica  Stiffness of left hip, not elsewhere classified     Problem List Patient Active Problem List   Diagnosis Date Noted  . Obesity 09/10/2015  . Family history of malignant neoplasm of ovary 01/18/2014  . Family history of malignant neoplasm of breast 01/18/2014  . Family history of malignant neoplasm of gastrointestinal tract 01/18/2014  . OSA (obstructive sleep apnea) 10/10/2013  . Sleep apnea 09/13/2013  . Allergic rhinitis 09/06/2012  . Essential hypertension, benign 09/06/2012    Marieta Markov, pTa 03/13/2020, 12:25 PM  [facility] [address], [place], [place], [address] Phone: [phone]   Fax:  [phone]  Name: Tia Hieronymus MRN: [id number] Date of Birth: Dec 22, 1950

## 2020-03-19 ENCOUNTER — Ambulatory Visit: Payer: Medicare PPO | Attending: Adult Health

## 2020-03-19 ENCOUNTER — Other Ambulatory Visit: Payer: Self-pay

## 2020-03-19 DIAGNOSIS — M5441 Lumbago with sciatica, right side: Secondary | ICD-10-CM | POA: Diagnosis not present

## 2020-03-19 DIAGNOSIS — M25652 Stiffness of left hip, not elsewhere classified: Secondary | ICD-10-CM | POA: Insufficient documentation

## 2020-03-19 DIAGNOSIS — M25551 Pain in right hip: Secondary | ICD-10-CM

## 2020-03-19 DIAGNOSIS — M25651 Stiffness of right hip, not elsewhere classified: Secondary | ICD-10-CM | POA: Diagnosis not present

## 2020-03-19 NOTE — Patient Instructions (Signed)

## 2020-03-19 NOTE — Therapy (Signed)
[facility] [address] [place], [place], [address] Phone: [phone]   Fax:  [phone]  Physical Therapy Treatment  Patient Details  Name: Kylie Davis MRN: [id number] Date of Birth: Mar 25, 1951 Referring Provider (PT): [name], NP    Encounter Date: 03/19/2020   PT End of Session - 03/19/20 1145    Visit Number 6    Date for PT Re-Evaluation 04/01/20    Authorization Type [employer]: 12 visits 9/15-11/15    Authorization - Visit Number 6    Authorization - Number of Visits 12    Progress Note Due on Visit 10    PT Start Time 1106    PT Stop Time 1141    PT Time Calculation (min) 35 min    Activity Tolerance Patient tolerated treatment well    Behavior During Therapy [facility] for tasks assessed/performed           Past Medical History:  Diagnosis Date  . Hypertension   . Sleep apnea    C PAP    Past Surgical History:  Procedure Laterality Date  . CHOLECYSTECTOMY  2004    There were no vitals filed for this visit.   Subjective Assessment - 03/19/20 1142    Subjective My back and Rt gluteals are sore from gardening.    Currently in Pain? Yes    Pain Score 3     Pain Location Hip    Pain Orientation Right    Pain Descriptors / Indicators Aching;Spasm    Pain Type Chronic pain                             [facility] Adult PT Treatment/Exercise - 03/19/20 0001      Manual Therapy   Manual Therapy Soft tissue mobilization;Myofascial release;Passive ROM    Manual therapy comments elongation and release to bil lumbar and Rt>Lt gluteals     Passive ROM ER  and IR to Rt hip by PT            Trigger Point Dry Needling - 03/19/20 0001    Consent Given? Yes    Education Handout Provided Yes    Muscles Treated Back/Hip Lumbar multifidi;Gluteus minimus;Gluteus medius;Gluteus maximus    Gluteus Minimus Response Twitch response elicited;Palpable increased muscle length    Gluteus  Medius Response Twitch response elicited;Palpable increased muscle length    Gluteus Maximus Response Twitch response elicited;Palpable increased muscle length    Lumbar multifidi Response Twitch response elicited;Palpable increased muscle length                PT Education - 03/19/20 1105    Education Details DN info    Person(s) Educated Patient    Methods Explanation;Handout    Comprehension Verbalized understanding;Returned demonstration            PT Short Term Goals - 03/07/20 1106      PT SHORT TERM GOAL #1   Title Pt will be independent with her initial HEP to increase ROM and activity tolerance.    Status Achieved             PT Long Term Goals - 03/07/20 1106      PT LONG TERM GOAL #3   Title Pt will report atleast 50% improvement in her pain with daily activity and donning her shoes.    Baseline 20%    Time 8    Period Weeks  Status On-going                 Plan - 03/19/20 1144    Clinical Impression Statement Pt arrived with Rt gluteal and bil lumbar pain after gardening this week.  Pt has been working on hip flexibility and has demonstrated improved mobility on the Rt although still limited.  Session focused on dry needling to lumbar spine and Rt>Lt gluteals.  Pt reported pain reduction and improved mobility after dry needling and P/ROM to Rt hip today.  Pt will continue to benefit from skilled PT to address limited ROM and pain.    Rehab Potential Good    PT Frequency 1x / week    PT Duration 8 weeks    PT Treatment/Interventions ADLs/Self Care Home Management;Cryotherapy;Electrical Stimulation;Moist Heat;Therapeutic exercise;Therapeutic activities;Neuromuscular re-education;Manual techniques;Dry needling;Patient/family education    PT Next Visit Plan ERO needed, dry needling if helpful    PT Home Exercise Plan [email or web address]    Consulted and Agree with Plan of Care Patient           Patient will benefit from skilled therapeutic intervention  in order to improve the following deficits and impairments:  Pain, Increased muscle spasms, Decreased mobility, Hypomobility, Decreased strength, Decreased range of motion, Impaired flexibility, Difficulty walking  Visit Diagnosis: Pain in right hip  Acute right-sided low back pain with right-sided sciatica  Stiffness of left hip, not elsewhere classified     Problem List Patient Active Problem List   Diagnosis Date Noted  . Obesity 09/10/2015  . Family history of malignant neoplasm of ovary 01/18/2014  . Family history of malignant neoplasm of breast 01/18/2014  . Family history of malignant neoplasm of gastrointestinal tract 01/18/2014  . OSA (obstructive sleep apnea) 10/10/2013  . Sleep apnea 09/13/2013  . Allergic rhinitis 09/06/2012  . Essential hypertension, benign 09/06/2012     Sigurd Sos, PT 03/19/20 11:48 AM  [facility] [address], [place], [place], [address] Phone: [phone]   Fax:  [phone]  Name: Kylie Davis MRN: [id number] Date of Birth: [birth year]

## 2020-03-25 ENCOUNTER — Other Ambulatory Visit: Payer: Self-pay | Admitting: Adult Health

## 2020-03-25 DIAGNOSIS — Z0000 Encounter for general adult medical examination without abnormal findings: Secondary | ICD-10-CM

## 2020-03-27 ENCOUNTER — Other Ambulatory Visit: Payer: Self-pay

## 2020-03-27 ENCOUNTER — Ambulatory Visit: Payer: Medicare PPO

## 2020-03-27 DIAGNOSIS — M25652 Stiffness of left hip, not elsewhere classified: Secondary | ICD-10-CM

## 2020-03-27 DIAGNOSIS — M5441 Lumbago with sciatica, right side: Secondary | ICD-10-CM

## 2020-03-27 DIAGNOSIS — M25551 Pain in right hip: Secondary | ICD-10-CM

## 2020-03-27 DIAGNOSIS — M25651 Stiffness of right hip, not elsewhere classified: Secondary | ICD-10-CM

## 2020-03-27 NOTE — Therapy (Signed)
[facility] [address], [place], [place], [address] Phone: [phone]   Fax:  [phone]  Physical Therapy Treatment  Patient Details  Name: Kylie Davis MRN: [id number] Date of Birth: 1951-02-11 Referring Provider (PT): [name], NP    Encounter Date: 03/27/2020   PT End of Session - 03/27/20 1145    Visit Number 7    Date for PT Re-Evaluation 05/22/20    Authorization Type [employer]: 12 visits 9/15-11/15- requested more    Authorization - Visit Number 7    Authorization - Number of Visits 12    Progress Note Due on Visit 10    PT Start Time 1103    PT Stop Time 1145    PT Time Calculation (min) 42 min    Activity Tolerance Patient tolerated treatment well           Past Medical History:  Diagnosis Date  . Hypertension   . Sleep apnea    C PAP    Past Surgical History:  Procedure Laterality Date  . CHOLECYSTECTOMY  2004    There were no vitals filed for this visit.   Subjective Assessment - 03/27/20 1105    Subjective The dry needling really worked.    Currently in Pain? Yes    Pain Score 0-No pain    Pain Location Hip    Pain Orientation Right    Pain Descriptors / Indicators Aching;Spasm    Pain Type Chronic pain              [facility] PT Assessment - 03/27/20 0001      Assessment   Medical Diagnosis Acute Rt sided LBP with sciatica     Referring Provider (PT) [name], NP       Prior Function   Level of Independence Independent      Observation/Other Assessments   Focus on Therapeutic Outcomes (FOTO)  44% limitation      PROM   Overall PROM Comments RT hip ER 35 deg passive: Hip IR 11 deg                         [facility] Adult PT Treatment/Exercise - 03/27/20 0001      Knee/Hip Exercises: Stretches   Other Knee/Hip Stretches butter fly and unilateral butterfly x10 each      Knee/Hip Exercises: Aerobic   Nustep L3 x 8 min with PT present to discuss  status.      Manual Therapy   Manual Therapy Soft tissue mobilization;Myofascial release;Passive ROM    Manual therapy comments Addaday to Rt gluteals     Passive ROM ER  and IR to Rt hip by PT                    PT Short Term Goals - 03/07/20 1106      PT SHORT TERM GOAL #1   Title Pt will be independent with her initial HEP to increase ROM and activity tolerance.    Status Achieved             PT Long Term Goals - 03/27/20 1113      PT LONG TERM GOAL #1   Title Pt will have 5/5 MMT strength of Rt LE to improve her efficency with daily activity.    Time 8    Period Weeks    Status On-going    Target Date 05/22/20      PT  LONG TERM GOAL #2   Title Pt will have atleast 10 deg improvement in hip external and internal rotation to improve her mechanics with daily activity.    Baseline improved but still limited    Time 8    Period Weeks    Status Revised      PT LONG TERM GOAL #3   Title Pt will report atleast 50% improvement in her pain with daily activity and donning her shoes.    Baseline 25% improvement    Time 8    Period Weeks    Status On-going    Target Date 05/22/20      PT LONG TERM GOAL #4   Title Pt will be able to go on her usual neighborhood walk without increase in Rt hip pain.    Baseline Not walking as much due to fear of pain    Time 8    Period Weeks    Status On-going    Target Date 05/22/20                 Plan - 03/27/20 1122    Clinical Impression Statement Pt reports and demonstrates overall improved Rt hip mobility and increased ease with putting on socks/shoes.  Pt reports 25% overall improvement in functional mobility and pain.  Pt has not been taking her usual neighborhood walks and PT encouraged her to start to walk short distances and build as able.  Pt responded well to dry needling last session and does not have any pain today.  Pt remains compliant with HEP for functional strength and flexibility of the Rt hip.  Pt  will continue to benefit from skilled PT to improve Rt hip mobility, ease of movement, functional strength and pain management as needed.    Rehab Potential Good    PT Frequency 1x / week    PT Duration 8 weeks    PT Treatment/Interventions ADLs/Self Care Home Management;Cryotherapy;Electrical Stimulation;Moist Heat;Therapeutic exercise;Therapeutic activities;Neuromuscular re-education;Manual techniques;Dry needling;Patient/family education    PT Next Visit Plan Rt hip flexibility, functional strength, endurance, manual as needed    PT Home Exercise Plan [email or web address]           Patient will benefit from skilled therapeutic intervention in order to improve the following deficits and impairments:  Pain, Increased muscle spasms, Decreased mobility, Hypomobility, Decreased strength, Decreased range of motion, Impaired flexibility, Difficulty walking  Visit Diagnosis: Pain in right hip - Plan: PT plan of care cert/re-cert  Acute right-sided low back pain with right-sided sciatica - Plan: PT plan of care cert/re-cert  Stiffness of left hip, not elsewhere classified  Stiffness of right hip, not elsewhere classified - Plan: PT plan of care cert/re-cert     Problem List Patient Active Problem List   Diagnosis Date Noted  . Obesity 09/10/2015  . Family history of malignant neoplasm of ovary 01/18/2014  . Family history of malignant neoplasm of breast 01/18/2014  . Family history of malignant neoplasm of gastrointestinal tract 01/18/2014  . OSA (obstructive sleep apnea) 10/10/2013  . Sleep apnea 09/13/2013  . Allergic rhinitis 09/06/2012  . Essential hypertension, benign 09/06/2012    Sigurd Sos, PT 03/27/20 11:47 AM   [facility] [address], [place], [place], [address] Phone: [phone]   Fax:  [phone]  Name: Kylie Davis MRN: [id number] Date of Birth: 01/26/1951

## 2020-04-03 ENCOUNTER — Encounter: Payer: Self-pay | Admitting: Physical Therapy

## 2020-04-03 ENCOUNTER — Ambulatory Visit: Payer: Medicare PPO | Admitting: Physical Therapy

## 2020-04-03 ENCOUNTER — Other Ambulatory Visit: Payer: Self-pay

## 2020-04-03 DIAGNOSIS — M25551 Pain in right hip: Secondary | ICD-10-CM | POA: Diagnosis not present

## 2020-04-03 DIAGNOSIS — M5441 Lumbago with sciatica, right side: Secondary | ICD-10-CM

## 2020-04-03 DIAGNOSIS — M25652 Stiffness of left hip, not elsewhere classified: Secondary | ICD-10-CM | POA: Diagnosis not present

## 2020-04-03 DIAGNOSIS — M25651 Stiffness of right hip, not elsewhere classified: Secondary | ICD-10-CM | POA: Diagnosis not present

## 2020-04-03 NOTE — Therapy (Signed)
[facility] [address] [place], [place], [address] Phone: [phone]   Fax:  [phone]  Physical Therapy Treatment  Patient Details  Name: Kylie Davis MRN: [id number] Date of Birth: [birth year] Referring Provider (PT): [name], NP    Encounter Date: 04/03/2020   PT End of Session - 04/03/20 1059    Visit Number 8    Date for PT Re-Evaluation 05/22/20    Authorization Type [employer]: 12 visits 9/15-11/15- requested more    Authorization Time Period 01/31/20 to 04/01/20    Authorization - Visit Number 8    Authorization - Number of Visits 12    Progress Note Due on Visit 10    PT Start Time 1100    PT Stop Time 1140    PT Time Calculation (min) 40 min    Activity Tolerance Patient tolerated treatment well    Behavior During Therapy [facility] for tasks assessed/performed           Past Medical History:  Diagnosis Date  . Hypertension   . Sleep apnea    C PAP    Past Surgical History:  Procedure Laterality Date  . CHOLECYSTECTOMY  2004    There were no vitals filed for this visit.   Subjective Assessment - 04/03/20 1103    Subjective The massage really helped last session.    Currently in Pain? No/denies    Multiple Pain Sites No                             [facility] Adult PT Treatment/Exercise - 04/03/20 0001      Knee/Hip Exercises: Aerobic   Nustep L3 x 10 min with PTA present to discuss status.      Knee/Hip Exercises: Supine   Other Supine Knee/Hip Exercises HIP ER/IR AROM 20x      Manual Therapy   Manual Therapy Soft tissue mobilization;Myofascial release;Passive ROM    Manual therapy comments Addaday to Rt gluteals     Joint Mobilization A/P hip mobs in sidelying, LONG AXIS LEG PULL/DISTRACTION    Soft tissue mobilization RT hip flexor/proximal quads    Passive ROM ER  and IR to Rt hip by PT                    PT Short Term Goals - 03/07/20 1106       PT SHORT TERM GOAL #1   Title Pt will be independent with her initial HEP to increase ROM and activity tolerance.    Status Achieved             PT Long Term Goals - 03/27/20 1113      PT LONG TERM GOAL #1   Title Pt will have 5/5 MMT strength of Rt LE to improve her efficency with daily activity.    Time 8    Period Weeks    Status On-going    Target Date 05/22/20      PT LONG TERM GOAL #2   Title Pt will have atleast 10 deg improvement in hip external and internal rotation to improve her mechanics with daily activity.    Baseline improved but still limited    Time 8    Period Weeks    Status Revised      PT LONG TERM GOAL #3   Title Pt will report atleast 50% improvement in her pain with daily activity and donning her  shoes.    Baseline 25% improvement    Time 8    Period Weeks    Status On-going    Target Date 05/22/20      PT LONG TERM GOAL #4   Title Pt will be able to go on her usual neighborhood walk without increase in Rt hip pain.    Baseline Not walking as much due to fear of pain    Time 8    Period Weeks    Status On-going    Target Date 05/22/20                 Plan - 04/03/20 1100    Clinical Impression Statement Pt continues to report an "overall" improvement in her Rt hip: less pain and more mobility but not much more than her last report of 25% improvement. She finds great benefit after needling and soft tissue work. MRI from September was reviewed with pt to answer her questions about what OA meant. All questions were addressed when pt was on NUstep. Poor femoral translation with mobilizations.    Personal Factors and Comorbidities Age;Fitness    Examination-Activity Limitations Stairs;Stand;Locomotion Level;Dressing    Stability/Clinical Decision Making Stable/Uncomplicated    Rehab Potential Good    PT Frequency 1x / week    PT Duration 8 weeks    PT Treatment/Interventions ADLs/Self Care Home Management;Cryotherapy;Electrical  Stimulation;Moist Heat;Therapeutic exercise;Therapeutic activities;Neuromuscular re-education;Manual techniques;Dry needling;Patient/family education    PT Next Visit Plan Rt hip flexibility, functional strength, endurance, manual as needed    PT Home Exercise Plan [email or web address]    Consulted and Agree with Plan of Care Patient           Patient will benefit from skilled therapeutic intervention in order to improve the following deficits and impairments:  Pain, Increased muscle spasms, Decreased mobility, Hypomobility, Decreased strength, Decreased range of motion, Impaired flexibility, Difficulty walking  Visit Diagnosis: Pain in right hip  Acute right-sided low back pain with right-sided sciatica  Stiffness of left hip, not elsewhere classified  Stiffness of right hip, not elsewhere classified     Problem List Patient Active Problem List   Diagnosis Date Noted  . Obesity 09/10/2015  . Family history of malignant neoplasm of ovary 01/18/2014  . Family history of malignant neoplasm of breast 01/18/2014  . Family history of malignant neoplasm of gastrointestinal tract 01/18/2014  . OSA (obstructive sleep apnea) 10/10/2013  . Sleep apnea 09/13/2013  . Allergic rhinitis 09/06/2012  . Essential hypertension, benign 09/06/2012    Jla Reynolds, PTA 04/03/2020, 11:43 AM  [facility] [address], [place], [place], [address] Phone: [phone]   Fax:  [phone]  Name: Kylie Davis MRN: [id number] Date of Birth: 1950/06/19

## 2020-04-08 ENCOUNTER — Other Ambulatory Visit: Payer: Self-pay

## 2020-04-08 ENCOUNTER — Ambulatory Visit: Payer: Medicare PPO | Admitting: Physical Therapy

## 2020-04-08 ENCOUNTER — Encounter: Payer: Self-pay | Admitting: Physical Therapy

## 2020-04-08 DIAGNOSIS — M25652 Stiffness of left hip, not elsewhere classified: Secondary | ICD-10-CM | POA: Diagnosis not present

## 2020-04-08 DIAGNOSIS — M25551 Pain in right hip: Secondary | ICD-10-CM

## 2020-04-08 DIAGNOSIS — M5441 Lumbago with sciatica, right side: Secondary | ICD-10-CM | POA: Diagnosis not present

## 2020-04-08 DIAGNOSIS — M25651 Stiffness of right hip, not elsewhere classified: Secondary | ICD-10-CM | POA: Diagnosis not present

## 2020-04-08 NOTE — Therapy (Signed)
[facility] [address], [place], [place], [address] Phone: [phone]   Fax:  [phone]  Physical Therapy Treatment  Patient Details  Name: Kylie Davis MRN: [id number] Date of Birth: [birth year] Referring Provider (PT): [name], NP    Encounter Date: 04/08/2020   PT End of Session - 04/08/20 1455    Visit Number 9    Date for PT Re-Evaluation 05/22/20    Authorization Type [employer]: 12 visits 9/15-11/15- requested more    Authorization Time Period 01/31/20 to 04/01/20    Authorization - Visit Number 9    Authorization - Number of Visits 12    Progress Note Due on Visit 10    PT Start Time 1455    PT Stop Time 1533    PT Time Calculation (min) 38 min    Activity Tolerance Patient tolerated treatment well    Behavior During Therapy [facility] for tasks assessed/performed           Past Medical History:  Diagnosis Date  . Hypertension   . Sleep apnea    C PAP    Past Surgical History:  Procedure Laterality Date  . CHOLECYSTECTOMY  2004    There were no vitals filed for this visit.   Subjective Assessment - 04/08/20 1457    Subjective Hip is good, between shoulders blades still hurting.    Currently in Pain? Yes    Pain Score 5     Pain Location Scapula    Pain Orientation Mid    Pain Descriptors / Indicators Sore    Pain Relieving Factors heat    Effect of Pain on Daily Activities Not much in regards to scapula    Multiple Pain Sites No                             [facility] Adult PT Treatment/Exercise - 04/08/20 0001      Knee/Hip Exercises: Aerobic   Nustep L3 x 10 min with PTA present to discuss status      Manual Therapy   Manual Therapy Soft tissue mobilization;Myofascial release;Passive ROM    Manual therapy comments Addaday to Rt gluteals     Joint Mobilization A/P hip mobs in sidelying, LONG AXIS LEG PULL/DISTRACTION    Soft tissue mobilization RT hip  flexor/proximal quads    Passive ROM ER  and IR to Rt hip by PT                  PT Education - 04/08/20 1531    Education Details Aquatic exercises for hip ROM & strength    Person(s) Educated Patient    Methods Explanation;Handout    Comprehension Verbalized understanding            PT Short Term Goals - 03/07/20 1106      PT SHORT TERM GOAL #1   Title Pt will be independent with her initial HEP to increase ROM and activity tolerance.    Status Achieved             PT Long Term Goals - 03/27/20 1113      PT LONG TERM GOAL #1   Title Pt will have 5/5 MMT strength of Rt LE to improve her efficency with daily activity.    Time 8    Period Weeks    Status On-going    Target Date 05/22/20      PT LONG TERM  GOAL #2   Title Pt will have atleast 10 deg improvement in hip external and internal rotation to improve her mechanics with daily activity.    Baseline improved but still limited    Time 8    Period Weeks    Status Revised      PT LONG TERM GOAL #3   Title Pt will report atleast 50% improvement in her pain with daily activity and donning her shoes.    Baseline 25% improvement    Time 8    Period Weeks    Status On-going    Target Date 05/22/20      PT LONG TERM GOAL #4   Title Pt will be able to go on her usual neighborhood walk without increase in Rt hip pain.    Baseline Not walking as much due to fear of pain    Time 8    Period Weeks    Status On-going    Target Date 05/22/20                 Plan - 04/08/20 1455    Clinical Impression Statement Pt requested aquatic exercises she can do when she goes to the pool. Rt hip MAY be a little looser today with mobs but only slightly.    Personal Factors and Comorbidities Age;Fitness    Examination-Activity Limitations Stairs;Stand;Locomotion Level;Dressing    Stability/Clinical Decision Making Stable/Uncomplicated    Rehab Potential Good    PT Frequency 1x / week    PT  Treatment/Interventions ADLs/Self Care Home Management;Cryotherapy;Electrical Stimulation;Moist Heat;Therapeutic exercise;Therapeutic activities;Neuromuscular re-education;Manual techniques;Dry needling;Patient/family education    PT Next Visit Plan Rt hip flexibility, functional strength, endurance, manual as needed    PT Home Exercise Plan [email or web address]    Consulted and Agree with Plan of Care Patient           Patient will benefit from skilled therapeutic intervention in order to improve the following deficits and impairments:  Pain, Increased muscle spasms, Decreased mobility, Hypomobility, Decreased strength, Decreased range of motion, Impaired flexibility, Difficulty walking  Visit Diagnosis: Pain in right hip  Acute right-sided low back pain with right-sided sciatica     Problem List Patient Active Problem List   Diagnosis Date Noted  . Obesity 09/10/2015  . Family history of malignant neoplasm of ovary 01/18/2014  . Family history of malignant neoplasm of breast 01/18/2014  . Family history of malignant neoplasm of gastrointestinal tract 01/18/2014  . OSA (obstructive sleep apnea) 10/10/2013  . Sleep apnea 09/13/2013  . Allergic rhinitis 09/06/2012  . Essential hypertension, benign 09/06/2012    Kenzy Campoverde, PTA 04/08/2020, 3:34 PM  [facility] [address], [place], [place], [address] Phone: [phone]   Fax:  [phone]  Name: Kylie Davis MRN: [id number] Date of Birth: 1950/10/12  Access Code: [email or web address]: [email or web address]: 11/22/2021Prepared by: Anderson Malta CochranExercises  Supine bridge off the edge of couch - 1 x daily - 7 x weekly - 3 sets - 10 reps  Supine Hip Internal and External Rotation - 2 x daily - 7 x weekly - 1 sets - 10 reps  Modified Thomas Stretch - 1 x daily - 7 x weekly - 2 sets - 10 reps - 15 hold  Hip Flexor Stretch on Step - 1 x daily - 7 x weekly -  3 sets - 10 reps  Clamshell - 1 x daily - 7 x weekly - 2 sets - 10 reps  Supine Hip External Rotation AAROM -  2 x daily - 7 x weekly - 1 sets - 3 reps - 20 hold  Supine Butterfly Groin Stretch - 2 x daily - 7 x weekly - 1 sets - 3 reps - 20 hold  Supine Bilateral Hip Internal Rotation Stretch - 3 x daily - 7 x weekly - 10 reps  Standing Hip Abduction Adduction at [place] - 1 x daily - 7 x weekly - 3 sets - 10 reps  Standing Hip Flexion Extension at [employer] - 1 x daily - 7 x weekly - 3 sets - 10 reps  Side Stepping - 1 x daily - 7 x weekly - 3 sets - 10 reps  Forward Walking - 1 x daily - 7 x weekly - 3 sets - 10 reps  Backward Walking - 1 x daily - 7 x weekly - 3 sets - 10 reps  Standing March at [place] 1 x daily - 7 x weekly - 3 sets - 10 reps  Heel Toe Raises at [place] - 1 x daily - 7 x weekly - 3 sets - 10 reps  Forward March - 1 x daily - 7 x weekly - 3 sets - 10 reps  Standing Hip Circles at [employer] - 1 x daily - 7 x weekly - 3 sets - 10 reps

## 2020-04-24 ENCOUNTER — Encounter: Payer: Self-pay | Admitting: Physical Therapy

## 2020-04-24 ENCOUNTER — Ambulatory Visit: Payer: Medicare PPO | Attending: Adult Health | Admitting: Physical Therapy

## 2020-04-24 ENCOUNTER — Other Ambulatory Visit: Payer: Self-pay

## 2020-04-24 DIAGNOSIS — M25652 Stiffness of left hip, not elsewhere classified: Secondary | ICD-10-CM

## 2020-04-24 DIAGNOSIS — M25651 Stiffness of right hip, not elsewhere classified: Secondary | ICD-10-CM | POA: Diagnosis not present

## 2020-04-24 DIAGNOSIS — M25551 Pain in right hip: Secondary | ICD-10-CM | POA: Diagnosis not present

## 2020-04-24 DIAGNOSIS — M5441 Lumbago with sciatica, right side: Secondary | ICD-10-CM | POA: Diagnosis not present

## 2020-04-24 NOTE — Therapy (Addendum)
[facility] [address], [place], [place], [address] Phone: [phone]   Fax:  [phone]  Physical Therapy Treatment  Patient Details  Name: Kylie Davis MRN: [id number] Date of Birth: 21-Jul-1950 Referring Provider (PT): [name], NP    Encounter Date: 04/24/2020 Progress Note Reporting Period 01/31/20 to 04/24/20  See note below for Objective Data and Assessment of Progress/Goals.       PT End of Session - 04/24/20 1229    Visit Number 10    Date for PT Re-Evaluation 05/22/20    Authorization Type [employer]: 12 visits 9/15-11/15- requested more    Authorization Time Period 01/31/20 to 04/01/20    Authorization - Visit Number 10    Authorization - Number of Visits 12    Progress Note Due on Visit 10    PT Start Time 1225    PT Stop Time 1304    PT Time Calculation (min) 39 min    Activity Tolerance Patient tolerated treatment well    Behavior During Therapy WFL for tasks assessed/performed           Past Medical History:  Diagnosis Date  . Hypertension   . Sleep apnea    C PAP    Past Surgical History:  Procedure Laterality Date  . CHOLECYSTECTOMY  2004    There were no vitals filed for this visit.   Subjective Assessment - 04/24/20 1231    Subjective Overall RT hip 50% improved. still can't put on sock.    Limitations Sitting;Walking    Currently in Pain? No/denies    Multiple Pain Sites No              [facility] PT Assessment - 04/24/20 0001      Observation/Other Assessments   Focus on Therapeutic Outcomes (FOTO)  44% limitation      PROM   Overall PROM Comments RT hip ER 35 passive, IR 25 degrees      Strength   Overall Strength Comments Rt hip flexion 4/5, extension 4+/5                          [facility] Adult PT Treatment/Exercise - 04/24/20 0001      Knee/Hip Exercises: Aerobic   Nustep L3 x 10 min with PTA present to discuss status      Knee/Hip  Exercises: Supine   Bridges Strengthening;Both;2 sets;10 reps    Other Supine Knee/Hip Exercises Hip ER/IR 20x     Other Supine Knee/Hip Exercises Marching 0# 10x, 2# to ankle 10x      Knee/Hip Exercises: Sidelying   Hip ABduction --   10x 2 with knee bent   Clams 2x10 AROM      Manual Therapy   Passive ROM ER  and IR to Rt hip by PT                    PT Short Term Goals - 03/07/20 1106      PT SHORT TERM GOAL #1   Title Pt will be independent with her initial HEP to increase ROM and activity tolerance.    Status Achieved             PT Long Term Goals - 04/24/20 1234      PT LONG TERM GOAL #1   Title Pt will have 5/5 MMT strength of Rt LE to improve her efficency with daily activity.    Time  8    Period Weeks    Status On-going      PT LONG TERM GOAL #2   Title Pt will have atleast 10 deg improvement in hip external and internal rotation to improve her mechanics with daily activity.    Baseline improved but still limited    Status On-going      PT LONG TERM GOAL #3   Title Pt will report atleast 50% improvement in her pain with daily activity and donning her shoes.    Time 8    Period Weeks    Status Partially Met   50% pain reduction but not in ease of getting socks/shoes on     PT LONG TERM GOAL #4   Title Pt will be able to go on her usual neighborhood walk without increase in Rt hip pain.    Time 8    Period Weeks    Status On-going   Not consistent due to pain; maybe 50% of the time.                Plan - 04/24/20 1229    Clinical Impression Statement PROM improved with hip IR but hte same for hip ER since last measurement. Hip extension MMT improvd since eval, hip flexion unchanged. Pt reports doing stretching 1x day, PTA suggested increasing her frequency to 1-2 additional more sesisons.    Personal Factors and Comorbidities Age;Fitness    Examination-Activity Limitations Stairs;Stand;Locomotion Level;Dressing    Stability/Clinical  Decision Making Stable/Uncomplicated    Rehab Potential Good    PT Frequency 1x / week    PT Treatment/Interventions ADLs/Self Care Home Management;Cryotherapy;Electrical Stimulation;Moist Heat;Therapeutic exercise;Therapeutic activities;Neuromuscular re-education;Manual techniques;Dry needling;Patient/family education    PT Next Visit Plan 2 more visits then pt goes to [place] for winter.    PT Home Exercise Plan [id number]    Consulted and Agree with Plan of Care Patient           Patient will benefit from skilled therapeutic intervention in order to improve the following deficits and impairments:  Pain, Increased muscle spasms, Decreased mobility, Hypomobility, Decreased strength, Decreased range of motion, Impaired flexibility, Difficulty walking  Visit Diagnosis: Pain in right hip  Acute right-sided low back pain with right-sided sciatica  Stiffness of left hip, not elsewhere classified  Stiffness of right hip, not elsewhere classified     Problem List Patient Active Problem List   Diagnosis Date Noted  . Obesity 09/10/2015  . Family history of malignant neoplasm of ovary 01/18/2014  . Family history of malignant neoplasm of breast 01/18/2014  . Family history of malignant neoplasm of gastrointestinal tract 01/18/2014  . OSA (obstructive sleep apnea) 10/10/2013  . Sleep apnea 09/13/2013  . Allergic rhinitis 09/06/2012  . Essential hypertension, benign 09/06/2012   Myrene Galas, PTA 04/24/20 2:43 PM  04/24/2020, 2:43 PM Sigurd Sos, PT 04/24/20 2:44 PM  [facility] [address], [place], [place], [address] Phone: [phone]   Fax:  [phone]  Name: Kylie Davis MRN: [id number] Date of Birth: [birth year]

## 2020-05-01 ENCOUNTER — Other Ambulatory Visit: Payer: Self-pay

## 2020-05-01 ENCOUNTER — Ambulatory Visit: Payer: Medicare PPO | Admitting: Physical Therapy

## 2020-05-01 ENCOUNTER — Encounter: Payer: Self-pay | Admitting: Physical Therapy

## 2020-05-01 DIAGNOSIS — M25652 Stiffness of left hip, not elsewhere classified: Secondary | ICD-10-CM | POA: Diagnosis not present

## 2020-05-01 DIAGNOSIS — M5441 Lumbago with sciatica, right side: Secondary | ICD-10-CM | POA: Diagnosis not present

## 2020-05-01 DIAGNOSIS — M25651 Stiffness of right hip, not elsewhere classified: Secondary | ICD-10-CM | POA: Diagnosis not present

## 2020-05-01 DIAGNOSIS — M25551 Pain in right hip: Secondary | ICD-10-CM | POA: Diagnosis not present

## 2020-05-01 NOTE — Therapy (Signed)
[facility] [address], [place], [place], [address] Phone: [phone]   Fax:  [phone]  Physical Therapy Treatment  Patient Details  Name: Kylie Davis MRN: [id number] Date of Birth: 06/21/1950 Referring Provider (PT): [name], NP    Encounter Date: 05/01/2020   PT End of Session - 05/01/20 1029    Visit Number 11    Date for PT Re-Evaluation 05/22/20    Authorization Type [employer]: 12 visits 9/15-11/15- requested more    Authorization Time Period 01/31/20 to 04/01/20    Authorization - Visit Number 11    Authorization - Number of Visits 12    PT Start Time 1028   15 minlate   PT Stop Time 1100    PT Time Calculation (min) 32 min    Activity Tolerance Patient tolerated treatment well    Behavior During Therapy [facility] for tasks assessed/performed           Past Medical History:  Diagnosis Date  . Hypertension   . Sleep apnea    C PAP    Past Surgical History:  Procedure Laterality Date  . CHOLECYSTECTOMY  2004    There were no vitals filed for this visit.   Subjective Assessment - 05/01/20 1032    Subjective Between my shoulder blades is better and strangely enough once that felt better so did my hip.    Currently in Pain? No/denies    Multiple Pain Sites No                             [facility] Adult PT Treatment/Exercise - 05/01/20 0001      Knee/Hip Exercises: Aerobic   Nustep --      Manual Therapy   Manual therapy comments Addaday to Rt gluteals     Passive ROM ER  and IR to Rt hip by PT                    PT Short Term Goals - 03/07/20 1106      PT SHORT TERM GOAL #1   Title Pt will be independent with her initial HEP to increase ROM and activity tolerance.    Status Achieved             PT Long Term Goals - 04/24/20 1234      PT LONG TERM GOAL #1   Title Pt will have 5/5 MMT strength of Rt LE to improve her efficency with daily activity.     Time 8    Period Weeks    Status On-going      PT LONG TERM GOAL #2   Title Pt will have atleast 10 deg improvement in hip external and internal rotation to improve her mechanics with daily activity.    Baseline improved but still limited    Status On-going      PT LONG TERM GOAL #3   Title Pt will report atleast 50% improvement in her pain with daily activity and donning her shoes.    Time 8    Period Weeks    Status Partially Met   50% pain reduction but not in ease of getting socks/shoes on     PT LONG TERM GOAL #4   Title Pt will be able to go on her usual neighborhood walk without increase in Rt hip pain.    Time 8    Period Weeks  Status On-going   Not consistent due to pain; maybe 50% of the time.                Plan - 05/01/20 1030    Clinical Impression Statement Pt arrived 15 min late for todays appt, got wrong appt time. PTA spent majority of time working on RT hip PROM, stretching and some minimal soft tissue work with Addaday at end. pt ended session with Nustep. pt reports since her scapular pain has subsided she also noticed "a change for the better" regrading her hip. Hip mainly bothers her when she is out doing alot ( hours ) of shopping.    Personal Factors and Comorbidities Age;Fitness    Examination-Activity Limitations Stairs;Stand;Locomotion Level;Dressing    Stability/Clinical Decision Making Stable/Uncomplicated    Rehab Potential Good    PT Frequency 1x / week    PT Duration 8 weeks    PT Treatment/Interventions ADLs/Self Care Home Management;Cryotherapy;Electrical Stimulation;Moist Heat;Therapeutic exercise;Therapeutic activities;Neuromuscular re-education;Manual techniques;Dry needling;Patient/family education    PT Next Visit Plan Dc next session.    PT Home Exercise Plan [id number]    Consulted and Agree with Plan of Care Patient           Patient will benefit from skilled therapeutic intervention in order to improve the following  deficits and impairments:  Pain,Increased muscle spasms,Decreased mobility,Hypomobility,Decreased strength,Decreased range of motion,Impaired flexibility,Difficulty walking  Visit Diagnosis: Pain in right hip  Acute right-sided low back pain with right-sided sciatica  Stiffness of left hip, not elsewhere classified  Stiffness of right hip, not elsewhere classified     Problem List Patient Active Problem List   Diagnosis Date Noted  . Obesity 09/10/2015  . Family history of malignant neoplasm of ovary 01/18/2014  . Family history of malignant neoplasm of breast 01/18/2014  . Family history of malignant neoplasm of gastrointestinal tract 01/18/2014  . OSA (obstructive sleep apnea) 10/10/2013  . Sleep apnea 09/13/2013  . Allergic rhinitis 09/06/2012  . Essential hypertension, benign 09/06/2012    Kylie Davis, PTA 05/01/2020, 11:00 AM  [facility] [address], [place], [place], [address] Phone: [phone]   Fax:  [phone]  Name: Kylie Davis MRN: [id number] Date of Birth: [birth year]

## 2020-05-02 ENCOUNTER — Ambulatory Visit
Admission: RE | Admit: 2020-05-02 | Discharge: 2020-05-02 | Disposition: A | Discharge status: Home / Self Care | Payer: Medicare PPO | Source: Ambulatory Visit | Attending: Adult Health | Admitting: Adult Health

## 2020-05-02 DIAGNOSIS — Z0000 Encounter for general adult medical examination without abnormal findings: Secondary | ICD-10-CM

## 2020-05-02 DIAGNOSIS — Z1231 Encounter for screening mammogram for malignant neoplasm of breast: Secondary | ICD-10-CM | POA: Diagnosis not present

## 2020-05-08 ENCOUNTER — Encounter: Payer: Self-pay | Admitting: Physical Therapy

## 2020-05-08 ENCOUNTER — Other Ambulatory Visit: Payer: Self-pay

## 2020-05-08 ENCOUNTER — Ambulatory Visit: Payer: Medicare PPO | Admitting: Physical Therapy

## 2020-05-08 DIAGNOSIS — M25651 Stiffness of right hip, not elsewhere classified: Secondary | ICD-10-CM

## 2020-05-08 DIAGNOSIS — M25652 Stiffness of left hip, not elsewhere classified: Secondary | ICD-10-CM | POA: Diagnosis not present

## 2020-05-08 DIAGNOSIS — M25551 Pain in right hip: Secondary | ICD-10-CM

## 2020-05-08 DIAGNOSIS — M5441 Lumbago with sciatica, right side: Secondary | ICD-10-CM

## 2020-05-08 NOTE — Therapy (Addendum)
[facility] [address], [place], [place], [address] Phone: [phone]   Fax:  [phone]  Physical Therapy Treatment  Patient Details  Name: Kylie Davis MRN: [id number] Date of Birth: 06-17-1950 Referring Provider (PT): [name], NP    Encounter Date: 05/08/2020   PT End of Session - 05/08/20 1008    Visit Number 12    Date for PT Re-Evaluation 05/22/20    Authorization Type [employer]: 12 visits 9/15-11/15- requested more    Authorization Time Period 01/31/20 to 04/01/20    Authorization - Visit Number 12    Authorization - Number of Visits 12    PT Start Time 1007    PT Stop Time 1043    PT Time Calculation (min) 36 min    Activity Tolerance Patient tolerated treatment well    Behavior During Therapy [facility] for tasks assessed/performed           Past Medical History:  Diagnosis Date  . Hypertension   . Sleep apnea    C PAP    Past Surgical History:  Procedure Laterality Date  . CHOLECYSTECTOMY  2004    There were no vitals filed for this visit.   Subjective Assessment - 05/08/20 1011    Subjective Lt hip 40% improved, pain is 90% less. Just can't get my sock on my LT foot.    Currently in Pain? No/denies    Multiple Pain Sites No              [facility] PT Assessment - 05/08/20 0001      Assessment   Medical Diagnosis Acute Rt sided LBP with sciatica     Referring Provider (PT) [name], NP       Observation/Other Assessments   Focus on Therapeutic Outcomes (FOTO)  48%      PROM   Overall PROM Comments RT hip ER 35 passive, IR 25 degrees      Strength   Overall Strength Comments Rt hip flexion 4/5, extension 4+/5                          [facility] Adult PT Treatment/Exercise - 05/08/20 0001      Knee/Hip Exercises: Aerobic   Nustep L3 x 10 min with PTA present to discuss status      Manual Therapy   Manual therapy comments Addaday to Rt gluteals     Passive  ROM ER  and IR to Rt hip by PT                    PT Short Term Goals - 03/07/20 1106      PT SHORT TERM GOAL #1   Title Pt will be independent with her initial HEP to increase ROM and activity tolerance.    Status Achieved             PT Long Term Goals - 05/08/20 1012      PT LONG TERM GOAL #1   Title Pt will have 5/5 MMT strength of Rt LE to improve her efficency with daily activity.    Time 8    Period Weeks    Status Not Met   See chart     PT LONG TERM GOAL #3   Title Pt will report atleast 50% improvement in her pain with daily activity and donning her shoes.    Time 8    Period Weeks  Status Achieved   90% less pain     PT LONG TERM GOAL #4   Title Pt will be able to go on her usual neighborhood walk without increase in Rt hip pain.    Time 8    Period Weeks    Status Partially Met   Long shopping trips can be painful, doing maybe 50% of her recreational walking.                Plan - 05/08/20 1009    Clinical Impression Statement Pt reports pain is 90% less since initial evaluation although she still cannot get her sock on her LT foot. HIp strength did not hit 5/5 per goals, but did show improvement since evaluation. FOTO score on last day increased a few points depsite pt's verbal reports of significant improvement. Deficits are mainly long periods of shopping ( many hours/ 3-4 stores in a row) where hip will get sore. Goal are partially met, pt is discharged due to going to [place] for winter months. Pt has aquatic exercises she will do while in [place].    Personal Factors and Comorbidities Age;Fitness    Examination-Activity Limitations Stairs;Stand;Locomotion Level;Dressing    Stability/Clinical Decision Making Stable/Uncomplicated    Rehab Potential Good    PT Frequency 1x / week    PT Duration 8 weeks    PT Treatment/Interventions ADLs/Self Care Home Management;Cryotherapy;Electrical Stimulation;Moist Heat;Therapeutic  exercise;Therapeutic activities;Neuromuscular re-education;Manual techniques;Dry needling;Patient/family education    PT Next Visit Plan Discharge to HEP and aquatic exercises while in [place].    PT Home Exercise Plan [id number]    Consulted and Agree with Plan of Care Patient           Patient will benefit from skilled therapeutic intervention in order to improve the following deficits and impairments:  Pain,Increased muscle spasms,Decreased mobility,Hypomobility,Decreased strength,Decreased range of motion,Impaired flexibility,Difficulty walking  Visit Diagnosis: Pain in right hip  Acute right-sided low back pain with right-sided sciatica  Stiffness of left hip, not elsewhere classified  Stiffness of right hip, not elsewhere classified     Problem List Patient Active Problem List   Diagnosis Date Noted  . Obesity 09/10/2015  . Family history of malignant neoplasm of ovary 01/18/2014  . Family history of malignant neoplasm of breast 01/18/2014  . Family history of malignant neoplasm of gastrointestinal tract 01/18/2014  . OSA (obstructive sleep apnea) 10/10/2013  . Sleep apnea 09/13/2013  . Allergic rhinitis 09/06/2012  . Essential hypertension, benign 09/06/2012    Myrene Galas, PTA 05/08/20 10:49 AM  05/08/2020, 10:49 AM PHYSICAL THERAPY DISCHARGE SUMMARY  Visits from Start of Care: 12  Current functional level related to goals / functional outcomes: See above for current status.  Pt is traveling to [place] for the Winter and will continue with HEP after discharge.    Remaining deficits: See above for current status.     Education / Equipment: HEP, Economist Plan: Patient agrees to discharge.  Patient goals were partially met. Patient is being discharged due to the patient's request.  ?????        Sigurd Sos, PT 05/08/20 11:47 AM  [facility] [address] [place], [place],  [address] Phone: [phone]   Fax:  [phone]  Name: Kylie Davis MRN: [id number] Date of Birth: 1950-08-30

## 2020-05-12 DIAGNOSIS — Z20822 Contact with and (suspected) exposure to covid-19: Secondary | ICD-10-CM | POA: Diagnosis not present

## 2020-05-15 ENCOUNTER — Encounter: Payer: Self-pay | Admitting: Adult Health

## 2020-05-15 ENCOUNTER — Telehealth (INDEPENDENT_AMBULATORY_CARE_PROVIDER_SITE_OTHER): Payer: Medicare PPO | Admitting: Adult Health

## 2020-05-15 VITALS — Temp 98.0°F | Wt 230.0 lb

## 2020-05-15 DIAGNOSIS — B349 Viral infection, unspecified: Secondary | ICD-10-CM

## 2020-05-15 DIAGNOSIS — R059 Cough, unspecified: Secondary | ICD-10-CM | POA: Diagnosis not present

## 2020-05-15 MED ORDER — HYDROCODONE-HOMATROPINE 5-1.5 MG/5ML PO SYRP: 5.0000 mL | ORAL_SOLUTION | Freq: Three times a day (TID) | ORAL | 0 refills | Status: DC | PRN

## 2020-05-15 MED ORDER — ALBUTEROL SULFATE HFA 108 (90 BASE) MCG/ACT IN AERS: 2.0000 | INHALATION_SPRAY | Freq: Four times a day (QID) | RESPIRATORY_TRACT | 0 refills | Status: DC | PRN

## 2020-05-15 NOTE — Progress Notes (Signed)
Virtual Visit via Video Note  I connected with Kylie Davis on 05/15/20 at  7:00 AM EST by a video enabled telemedicine application and verified that I am speaking with the correct person using two identifiers.  Location patient: home Location provider:work or home office Persons participating in the virtual visit: patient, provider  I discussed the limitations of evaluation and management by telemedicine and the availability of in person appointments. The patient expressed understanding and agreed to proceed.   HPI: This 69 year old female who is being evaluated today for possible COVID-19 infection. She reports that her husband tested positive for COVID-19 4 days ago, her initial test was negative but she will be retesting later on today. Symptoms include congestion and a dry cough. She denies fevers or chills and does not feel necessarily acutely ill. Has not experienced shortness of breath or wheezing. Her biggest complaint is that of a cough that is keeping her up at night.   ROS: See pertinent positives and negatives per HPI.  Past Medical History:  Diagnosis Date   Hypertension    Sleep apnea    C PAP    Past Surgical History:  Procedure Laterality Date   CHOLECYSTECTOMY  2004    Family History  Problem Relation Age of Onset   Cancer Mother 33       primary peritoneal cancer   Alzheimer's disease Mother    Cancer Father 66       stomach or pancreatic   Heart disease Father    Hypertension Brother    Heart attack Brother 10       MI x 2   Diabetes Paternal Grandmother    Breast cancer Paternal Aunt 63   Colon cancer Neg Hx        Current Outpatient Medications:    albuterol (VENTOLIN HFA) 108 (90 Base) MCG/ACT inhaler, Inhale 2 puffs into the lungs every 6 (six) hours as needed for wheezing or shortness of breath., Disp: 8 g, Rfl: 0   aspirin 81 MG tablet, Take 81 mg by mouth daily., Disp: , Rfl:    calcium-vitamin D (OSCAL-500) 500-400 MG-UNIT  tablet, Take 1 tablet by mouth daily., Disp: , Rfl:    HYDROcodone-homatropine (HYCODAN) 5-1.5 MG/5ML syrup, Take 5 mLs by mouth every 8 (eight) hours as needed for cough., Disp: 120 mL, Rfl: 0   losartan-hydrochlorothiazide (HYZAAR) 50-12.5 MG tablet, TAKE 1 TABLET BY MOUTH DAILY, Disp: 90 tablet, Rfl: 0   Multiple Vitamins-Minerals (MULTIVITAMIN PO), Take by mouth., Disp: , Rfl:   EXAM:  VITALS per patient if applicable:  GENERAL: alert, oriented, appears well and in no acute distress  HEENT: atraumatic, conjunttiva clear, no obvious abnormalities on inspection of external nose and ears  NECK: normal movements of the head and neck  LUNGS: on inspection no signs of respiratory distress, breathing rate appears normal, no obvious gross SOB, gasping or wheezing  CV: no obvious cyanosis  MS: moves all visible extremities without noticeable abnormality  PSYCH/NEURO: pleasant and cooperative, no obvious depression or anxiety, speech and thought processing grossly intact  ASSESSMENT AND PLAN:  Discussed the following assessment and plan:  1. Cough  - albuterol (VENTOLIN HFA) 108 (90 Base) MCG/ACT inhaler; Inhale 2 puffs into the lungs every 6 (six) hours as needed for wheezing or shortness of breath.  Dispense: 8 g; Refill: 0 - HYDROcodone-homatropine (HYCODAN) 5-1.5 MG/5ML syrup; Take 5 mLs by mouth every 8 (eight) hours as needed for cough.  Dispense: 120 mL; Refill: 0  2.  Viral infection -Mild symptoms, likely due to COVID-19. She will update me when she has her test results. She was advised to follow-up via video visit if her symptoms worsen or she develops a fever     I discussed the assessment and treatment plan with the patient. The patient was provided an opportunity to ask questions and all were answered. The patient agreed with the plan and demonstrated an understanding of the instructions.   The patient was advised to call back or seek an in-person evaluation if the  symptoms worsen or if the condition fails to improve as anticipated.   Shirline Frees, NP

## 2020-05-16 ENCOUNTER — Encounter: Payer: Self-pay | Admitting: Adult Health

## 2020-05-29 DIAGNOSIS — G4733 Obstructive sleep apnea (adult) (pediatric): Secondary | ICD-10-CM | POA: Diagnosis not present

## 2020-06-01 DIAGNOSIS — U071 COVID-19: Secondary | ICD-10-CM | POA: Diagnosis not present

## 2020-06-10 ENCOUNTER — Telehealth: Payer: Self-pay | Admitting: Adult Health

## 2020-06-10 NOTE — Telephone Encounter (Signed)
Kylie Davis call and want Kylie Davis to call her back about Kylie Davis she want him to know that Kylie Davis is in [facility] and it don't look good.

## 2020-06-12 ENCOUNTER — Other Ambulatory Visit: Payer: Self-pay | Admitting: Adult Health

## 2020-06-12 DIAGNOSIS — R059 Cough, unspecified: Secondary | ICD-10-CM

## 2020-06-26 ENCOUNTER — Other Ambulatory Visit: Payer: Self-pay | Admitting: Adult Health

## 2020-06-27 NOTE — Telephone Encounter (Signed)
SENT TO THE PHARMACY BY E-SCRIBE. 

## 2020-07-01 DIAGNOSIS — L814 Other melanin hyperpigmentation: Secondary | ICD-10-CM | POA: Diagnosis not present

## 2020-07-01 DIAGNOSIS — L578 Other skin changes due to chronic exposure to nonionizing radiation: Secondary | ICD-10-CM | POA: Diagnosis not present

## 2020-07-01 DIAGNOSIS — L570 Actinic keratosis: Secondary | ICD-10-CM | POA: Diagnosis not present

## 2020-07-01 DIAGNOSIS — L815 Leukoderma, not elsewhere classified: Secondary | ICD-10-CM | POA: Diagnosis not present

## 2020-07-01 DIAGNOSIS — L821 Other seborrheic keratosis: Secondary | ICD-10-CM | POA: Diagnosis not present

## 2020-07-01 DIAGNOSIS — D1801 Hemangioma of skin and subcutaneous tissue: Secondary | ICD-10-CM | POA: Diagnosis not present

## 2020-08-19 ENCOUNTER — Telehealth (INDEPENDENT_AMBULATORY_CARE_PROVIDER_SITE_OTHER): Payer: Medicare PPO | Admitting: Family Medicine

## 2020-08-19 ENCOUNTER — Encounter: Payer: Self-pay | Admitting: Family Medicine

## 2020-08-19 VITALS — Wt 230.0 lb

## 2020-08-19 DIAGNOSIS — J019 Acute sinusitis, unspecified: Secondary | ICD-10-CM | POA: Diagnosis not present

## 2020-08-19 MED ORDER — METHYLPREDNISOLONE 4 MG PO TBPK: ORAL_TABLET | ORAL | 0 refills | Status: DC

## 2020-08-19 MED ORDER — AZITHROMYCIN 250 MG PO TABS: ORAL_TABLET | ORAL | 0 refills | Status: DC

## 2020-08-19 NOTE — Progress Notes (Signed)
Subjective:    Patient ID: Maree Krabbe, female    DOB: 23-Dec-1950, 70 y.o.   MRN: [id number]  HPI Virtual Visit via Video Note  I connected with the patient on 08/19/20 at  2:45 PM EDT by a video enabled telemedicine application and verified that I am speaking with the correct person using two identifiers.  Location patient: home Location provider:work or home office Persons participating in the virtual visit: patient, provider  I discussed the limitations of evaluation and management by telemedicine and the availability of in person appointments. The patient expressed understanding and agreed to proceed.   HPI: Here for 5 days of sinus pressure, ear pressure, PND, and blowing yellow mucus from the nose. No fever or body aches. No cough or SOB. No NVD. Mucinex and Alka Seltzer Plus have not helped.   ROS: See pertinent positives and negatives per HPI.  Past Medical History:  Diagnosis Date  . Hypertension   . Sleep apnea    C PAP    Past Surgical History:  Procedure Laterality Date  . CHOLECYSTECTOMY  2004    Family History  Problem Relation Age of Onset  . Cancer Mother 70       primary peritoneal cancer  . Alzheimer's disease Mother   . Cancer Father 15       stomach or pancreatic  . Heart disease Father   . Hypertension Brother   . Heart attack Brother 14       MI x 2  . Diabetes Paternal Grandmother   . Breast cancer Paternal Aunt 52  . Colon cancer Neg Hx      Current Outpatient Medications:  .  albuterol (VENTOLIN HFA) 108 (90 Base) MCG/ACT inhaler, Inhale 2 puffs into the lungs every 6 (six) hours as needed for wheezing or shortness of breath., Disp: 8 g, Rfl: 0 .  aspirin 81 MG tablet, Take 81 mg by mouth daily., Disp: , Rfl:  .  azithromycin (ZITHROMAX Z-PAK) 250 MG tablet, As directed, Disp: 6 each, Rfl: 0 .  calcium-vitamin D (OSCAL-500) 500-400 MG-UNIT tablet, Take 1 tablet by mouth daily., Disp: , Rfl:  .  HYDROcodone-homatropine (HYCODAN)  5-1.5 MG/5ML syrup, Take 5 mLs by mouth every 8 (eight) hours as needed for cough., Disp: 120 mL, Rfl: 0 .  losartan-hydrochlorothiazide (HYZAAR) 50-12.5 MG tablet, TAKE 1 TABLET BY MOUTH DAILY, Disp: 90 tablet, Rfl: 0 .  methylPREDNISolone (MEDROL DOSEPAK) 4 MG TBPK tablet, As directed, Disp: 21 tablet, Rfl: 0 .  Multiple Vitamins-Minerals (MULTIVITAMIN PO), Take by mouth., Disp: , Rfl:   EXAM:  VITALS per patient if applicable:  GENERAL: alert, oriented, appears well and in no acute distress  HEENT: atraumatic, conjunttiva clear, no obvious abnormalities on inspection of external nose and ears  NECK: normal movements of the head and neck  LUNGS: on inspection no signs of respiratory distress, breathing rate appears normal, no obvious gross SOB, gasping or wheezing  CV: no obvious cyanosis  MS: moves all visible extremities without noticeable abnormality  PSYCH/NEURO: pleasant and cooperative, no obvious depression or anxiety, speech and thought processing grossly intact  ASSESSMENT AND PLAN: Sinusitis, treat with a Zpack and a Medrol dose pack.  Alysia Penna, MD   Discussed the following assessment and plan:  No diagnosis found.     I discussed the assessment and treatment plan with the patient. The patient was provided an opportunity to ask questions and all were answered. The patient agreed with the plan and demonstrated  an understanding of the instructions.   The patient was advised to call back or seek an in-person evaluation if the symptoms worsen or if the condition fails to improve as anticipated.     Review of Systems     Objective:   Physical Exam        Assessment & Plan:

## 2020-08-29 DIAGNOSIS — G4733 Obstructive sleep apnea (adult) (pediatric): Secondary | ICD-10-CM | POA: Diagnosis not present

## 2020-09-03 DIAGNOSIS — L821 Other seborrheic keratosis: Secondary | ICD-10-CM | POA: Diagnosis not present

## 2020-09-03 DIAGNOSIS — D225 Melanocytic nevi of trunk: Secondary | ICD-10-CM | POA: Diagnosis not present

## 2020-09-03 DIAGNOSIS — L814 Other melanin hyperpigmentation: Secondary | ICD-10-CM | POA: Diagnosis not present

## 2020-09-03 DIAGNOSIS — D1801 Hemangioma of skin and subcutaneous tissue: Secondary | ICD-10-CM | POA: Diagnosis not present

## 2020-09-18 ENCOUNTER — Telehealth: Payer: Self-pay | Admitting: Adult Health

## 2020-09-18 NOTE — Telephone Encounter (Signed)
Pt decided to come in for OV tomorrow w/ TP, has other questions she wanted to speak with her directly about

## 2020-09-19 ENCOUNTER — Ambulatory Visit: Payer: Medicare PPO | Admitting: Adult Health

## 2020-09-19 ENCOUNTER — Telehealth: Payer: Self-pay | Admitting: *Deleted

## 2020-09-19 ENCOUNTER — Other Ambulatory Visit: Payer: Self-pay

## 2020-09-19 ENCOUNTER — Encounter: Payer: Self-pay | Admitting: Adult Health

## 2020-09-19 DIAGNOSIS — G4733 Obstructive sleep apnea (adult) (pediatric): Secondary | ICD-10-CM | POA: Diagnosis not present

## 2020-09-19 NOTE — Telephone Encounter (Signed)
I have called and spoke with Melissa with [employer].  She stated that the issue with the cpap is that the in the initial first 90 days that the pt had her cpap she was not compliant.  Melissa stated that she is not sure if the pt just didn't use the cpap or if she was using the 2 machines--which could have caused her to show that she was not compliant.   Melissa said that since that was in the initial phase the insurance will not pay for the pts cpap.  Pt will either have to   1.  Start over 2. Self pay  Will forward back to TP to make her aware and see what the next step will be.

## 2020-09-19 NOTE — Assessment & Plan Note (Signed)
Healthy weight loss discussed 

## 2020-09-19 NOTE — Telephone Encounter (Signed)
Called and spoke with [place] regarding CPAP issue with [employer], Leroy Sea stated he will have billing call the patient, there was some issue with compliance during the first 90 days of use with new CPAP machine.  Patient was seen in the office and brought her download for the past 90 days which showed 86.52% compliance, Tammy Parrett NP reviewed the data and stated that was more than enough usage for compliance.  Patient has been using her old CPAP machine when she travels and she travels often.  A copy was made of the download and was faxed to [phone] with attention Brad New.  Received fax confirmation that it was sent successfully.  Compliance placed in scan folder for scan.  Advised patient to call the office if she has any further problems regarding compliance with [employer].  She verbalized understanding.  Nothing further needed at this time.

## 2020-09-19 NOTE — Patient Instructions (Signed)
Continue on CPAP  Keep up the good work  Work on healthy weight Do not drive if sleepy  Follow-up in 1 year with Dr. Alva  Or Kassim Guertin NP and As needed.  

## 2020-09-19 NOTE — Assessment & Plan Note (Signed)
excellent control and compliance on nocturnal CPAP.  Keep up the good work  Plan  Patient Instructions  Continue on CPAP  Keep up the good work  Work on [employer] Do not drive if sleepy  Follow-up in 1 year with Dr. Elsworth Soho  Or Kaylor Simenson NP and As needed.

## 2020-09-19 NOTE — Progress Notes (Signed)
@Patient  ID: Kylie Davis, female    DOB: Dec 23, 1950, 70 y.o.   MRN: [id number]  Chief Complaint  Patient presents with  . Follow-up    Referring provider: [name], NP  HPI: 70 year old female never smoker followed for obstructive sleep apnea  Retired Education officer, [employer]  TEST/EVENTS :  HST 10/2013: AHI 25/hr.  09/19/2020 Follow up : OSA  Patient presents for a 1 year follow-up.  Patient has underlying moderate obstructive sleep apnea is on nocturnal CPAP.  She says she is doing well on CPAP.  She says she wears it every single night.  She feels that she benefits from CPAP and has decreased daytime sleepiness.  Typically wears her CPAP about 6 to 7 hours.  Recently got a new CPAP machine.  She also travels and has a second machine that she uses when she travels. CPAP download over the last 90 days shows 86% usage.  Daily average use is at 6.5 hours.  AHI 3.7.  Patient says the nights that showed no use that she used her travel CPAP.  And wears it for around 6 to 7 hours.  Patient says she never misses any nights of her CPAP.  Patient is very interested in a sleep apnea support group [place] a lot to [place].  Has a large boat.    No Known Allergies  Immunization History  Administered Date(s) Administered  . Influenza Split 02/25/2012, 03/18/2013, 02/02/2014  . Influenza, High Dose Seasonal PF 02/28/2015, 02/20/2016, 03/03/2017, 03/08/2018, 03/17/2019  . Moderna SARS-COV2 Booster Vaccination 05/10/2020  . Moderna Sars-Covid-2 Vaccination 07/22/2019, 08/22/2019  . Pneumococcal Conjugate-13 02/28/2015  . Pneumococcal Polysaccharide-23 02/20/2016  . Td 05/19/2003  . Tdap 09/13/2013  . Zoster 12/15/2010  . Zoster Recombinat (Shingrix) 06/25/2018, 11/06/2018    Past Medical History:  Diagnosis Date  . Hypertension   . Sleep apnea    C PAP    Tobacco History: Social History   Tobacco Use  Smoking Status Never Smoker  Smokeless Tobacco  Never Used   Counseling given: Not Answered   Outpatient Medications Prior to Visit  Medication Sig Dispense Refill  . aspirin 81 MG tablet Take 81 mg by mouth daily.    . calcium-vitamin D (OSCAL-500) 500-400 MG-UNIT tablet Take 1 tablet by mouth daily.    Marland Kitchen losartan-hydrochlorothiazide (HYZAAR) 50-12.5 MG tablet TAKE 1 TABLET BY MOUTH DAILY 90 tablet 0  . Multiple Vitamins-Minerals (MULTIVITAMIN PO) Take by mouth.    Marland Kitchen albuterol (VENTOLIN HFA) 108 (90 Base) MCG/ACT inhaler Inhale 2 puffs into the lungs every 6 (six) hours as needed for wheezing or shortness of breath. (Patient not taking: Reported on 09/19/2020) 8 g 0  . azithromycin (ZITHROMAX Z-PAK) 250 MG tablet As directed (Patient not taking: Reported on 09/19/2020) 6 each 0  . HYDROcodone-homatropine (HYCODAN) 5-1.5 MG/5ML syrup Take 5 mLs by mouth every 8 (eight) hours as needed for cough. 120 mL 0  . methylPREDNISolone (MEDROL DOSEPAK) 4 MG TBPK tablet As directed (Patient not taking: Reported on 09/19/2020) 21 tablet 0   No facility-administered medications prior to visit.     Review of Systems:   Constitutional:   No  weight loss, night sweats,  Fevers, chills, fatigue, or  lassitude.  HEENT:   No headaches,  Difficulty swallowing,  Tooth/dental problems, or  Sore throat,                No sneezing, itching, ear ache, nasal congestion, post nasal drip,   CV:  No chest pain,  Orthopnea, PND, swelling in lower extremities, anasarca, dizziness, palpitations, syncope.   GI  No heartburn, indigestion, abdominal pain, nausea, vomiting, diarrhea, change in bowel habits, loss of appetite, bloody stools.   Resp: No shortness of breath with exertion or at rest.  No excess mucus, no productive cough,  No non-productive cough,  No coughing up of blood.  No change in color of mucus.  No wheezing.  No chest wall deformity  Skin: no rash or lesions.  GU: no dysuria, change in color of urine, no urgency or frequency.  No flank pain, no  hematuria   MS:  No joint pain or swelling.  No decreased range of motion.  No back pain.    Physical Exam  BP 130/80 (BP Location: Right Arm, Patient Position: Sitting, Cuff Size: Large)   Pulse 85   Temp (!) 97.4 F (36.3 C) (Temporal)   Ht 5\' 4"  (1.626 m)   Wt 246 lb 6.4 oz (111.8 kg)   SpO2 97%   BMI 42.29 kg/m   GEN: A/Ox3; pleasant , NAD, well nourished    HEENT:  [place]/AT,   NOSE-clear, THROAT-clear, no lesions, no postnasal drip or exudate noted.   NECK:  Supple w/ fair ROM; no JVD; normal carotid impulses w/o bruits; no thyromegaly or nodules palpated; no lymphadenopathy.    RESP  Clear  P & A; w/o, wheezes/ rales/ or rhonchi. no accessory muscle use, no dullness to percussion  CARD:  RRR, no m/r/g, no peripheral edema, pulses intact, no cyanosis or clubbing.  GI:   Soft & nt; nml bowel sounds; no organomegaly or masses detected.   Musco: Warm bil, no deformities or joint swelling noted.   Neuro: alert, no focal deficits noted.    Skin: Warm, no lesions or rashes    Lab Results:  BNP No results found for: BNP  ProBNP No results found for: PROBNP  Imaging: No results found.    No flowsheet data found.  No results found for: NITRICOXIDE      Assessment & Plan:   OSA (obstructive sleep apnea) excellent control and compliance on nocturnal CPAP.  Keep up the good work  Plan  Patient Instructions  Continue on CPAP  Keep up the good work  Work on [employer] Do not drive if sleepy  Follow-up in 1 year with Dr. Elsworth Soho  Or Zauria Dombek NP and As needed.      Obesity Healthy weight loss discussed     Rexene Edison, NP 09/19/2020

## 2020-09-20 NOTE — Telephone Encounter (Signed)
TP---[phone]

## 2020-09-20 NOTE — Telephone Encounter (Signed)
Spoke with Melissa at [employer] has a clause in it if she does not compliant with the actual new machine for the first 90 days they will not pay so patient will have to contact her insurance and or return device.  Call patient patient aware and will contact us when she returns back from her current trip

## 2020-09-20 NOTE — Telephone Encounter (Signed)
I tried to call her but she did not answer  Give her my cell # please to call me

## 2020-09-20 NOTE — Telephone Encounter (Signed)
Please get a number for Melissa so I can call Melissa myself because she had an 86% compliance

## 2020-09-20 NOTE — Telephone Encounter (Signed)
Called and left a VM for Melissa letting her know that the patient was seen in the office on 09/19/20 and that according to the download she has excellent compliance.  I attempted to fax her compliance to [phone], provided by Demetrius Charity on yesterday with attention to [facility].  Requested a fax number that I can fax the compliance report to that will not ring as busy.  Attempted numerous time up until 5:15 pm yesterday.  Advised that Tammy Parrett NP also wants to speak with her as well.

## 2020-09-20 NOTE — Telephone Encounter (Signed)
Spoke with Kylie Davis  She was given Tammy's cell  She states will call her today

## 2020-09-30 DIAGNOSIS — G4733 Obstructive sleep apnea (adult) (pediatric): Secondary | ICD-10-CM | POA: Diagnosis not present

## 2020-10-01 ENCOUNTER — Other Ambulatory Visit: Payer: Self-pay | Admitting: Adult Health

## 2020-12-18 ENCOUNTER — Other Ambulatory Visit: Payer: Self-pay

## 2020-12-18 ENCOUNTER — Ambulatory Visit (INDEPENDENT_AMBULATORY_CARE_PROVIDER_SITE_OTHER): Payer: Medicare PPO

## 2020-12-18 VITALS — BP 126/70 | HR 80 | Temp 98.5°F | Ht 64.0 in | Wt 244.0 lb

## 2020-12-18 DIAGNOSIS — Z0000 Encounter for general adult medical examination without abnormal findings: Secondary | ICD-10-CM | POA: Diagnosis not present

## 2020-12-18 NOTE — Patient Instructions (Signed)
Kylie Davis , Thank you for taking time to come for your Medicare Wellness Visit. I appreciate your ongoing commitment to your health goals. Please review the following plan we discussed and let me know if I can assist you in the future.   Screening recommendations/referrals: Colonoscopy: 02/24/2013  due 2024 Mammogram: 05/02/2020 Bone Density: 04/21/2016 Recommended yearly ophthalmology/optometry visit for glaucoma screening and checkup Recommended yearly dental visit for hygiene and checkup  Vaccinations: Influenza vaccine: due in fall 2022  Pneumococcal vaccine: completed series  Tdap vaccine: 09/13/2013 Shingles vaccine: completed series     Advanced directives: will provide copies   Conditions/risks identified: none   Next appointment: none    Preventive Care 71 Years and Older, Female Preventive care refers to lifestyle choices and visits with your health care provider that can promote health and wellness. What does preventive care include? A yearly physical exam. This is also called an annual well check. Dental exams once or twice a year. Routine eye exams. Ask your health care provider how often you should have your eyes checked. Personal lifestyle choices, including: Daily care of your teeth and gums. Regular physical activity. Eating a healthy diet. Avoiding tobacco and drug use. Limiting alcohol use. Practicing safe sex. Taking low-dose aspirin every day. Taking vitamin and mineral supplements as recommended by your health care provider. What happens during an annual well check? The services and screenings done by your health care provider during your annual well check will depend on your age, overall health, lifestyle risk factors, and family history of disease. Counseling  Your health care provider may ask you questions about your: Alcohol use. Tobacco use. Drug use. Emotional well-being. Home and relationship well-being. Sexual activity. Eating  habits. History of falls. Memory and ability to understand (cognition). Work and work Statistician. Reproductive health. Screening  You may have the following tests or measurements: Height, weight, and BMI. Blood pressure. Lipid and cholesterol levels. These may be checked every 5 years, or more frequently if you are over 51 years old. Skin check. Lung cancer screening. You may have this screening every year starting at age 25 if you have a 30-pack-year history of smoking and currently smoke or have quit within the past 15 years. Fecal occult blood test (FOBT) of the stool. You may have this test every year starting at age 42. Flexible sigmoidoscopy or colonoscopy. You may have a sigmoidoscopy every 5 years or a colonoscopy every 10 years starting at age 37. Hepatitis C blood test. Hepatitis B blood test. Sexually transmitted disease (STD) testing. Diabetes screening. This is done by checking your blood sugar (glucose) after you have not eaten for a while (fasting). You may have this done every 1-3 years. Bone density scan. This is done to screen for osteoporosis. You may have this done starting at age 37. Mammogram. This may be done every 1-2 years. Talk to your health care provider about how often you should have regular mammograms. Talk with your health care provider about your test results, treatment options, and if necessary, the need for more tests. Vaccines  Your health care provider may recommend certain vaccines, such as: Influenza vaccine. This is recommended every year. Tetanus, diphtheria, and acellular pertussis (Tdap, Td) vaccine. You may need a Td booster every 10 years. Zoster vaccine. You may need this after age 17. Pneumococcal 13-valent conjugate (PCV13) vaccine. One dose is recommended after age 41. Pneumococcal polysaccharide (PPSV23) vaccine. One dose is recommended after age 16. Talk to your health care provider about  which screenings and vaccines you need and how  often you need them. This information is not intended to replace advice given to you by your health care provider. Make sure you discuss any questions you have with your health care provider. Document Released: 05/31/2015 Document Revised: 01/22/2016 Document Reviewed: 03/05/2015 Elsevier Interactive Patient Education  2017 [place] Prevention in the Home Falls can cause injuries. They can happen to people of all ages. There are many things you can do to make your home safe and to help prevent falls. What can I do on the outside of my home? Regularly fix the edges of walkways and driveways and fix any cracks. Remove anything that might make you trip as you walk through a door, such as a raised step or threshold. Trim any bushes or trees on the path to your home. Use bright outdoor lighting. Clear any walking paths of anything that might make someone trip, such as rocks or tools. Regularly check to see if handrails are loose or broken. Make sure that both sides of any steps have handrails. Any raised decks and porches should have guardrails on the edges. Have any leaves, snow, or ice cleared regularly. Use sand or salt on walking paths during winter. Clean up any spills in your garage right away. This includes oil or grease spills. What can I do in the bathroom? Use night lights. Install grab bars by the toilet and in the tub and shower. Do not use towel bars as grab bars. Use non-skid mats or decals in the tub or shower. If you need to sit down in the shower, use a plastic, non-slip stool. Keep the floor dry. Clean up any water that spills on the floor as soon as it happens. Remove soap buildup in the tub or shower regularly. Attach bath mats securely with double-sided non-slip rug tape. Do not have throw rugs and other things on the floor that can make you trip. What can I do in the bedroom? Use night lights. Make sure that you have a light by your bed that is easy to  reach. Do not use any sheets or blankets that are too big for your bed. They should not hang down onto the floor. Have a firm chair that has side arms. You can use this for support while you get dressed. Do not have throw rugs and other things on the floor that can make you trip. What can I do in the kitchen? Clean up any spills right away. Avoid walking on wet floors. Keep items that you use a lot in easy-to-reach places. If you need to reach something above you, use a strong step stool that has a grab bar. Keep electrical cords out of the way. Do not use floor polish or wax that makes floors slippery. If you must use wax, use non-skid floor wax. Do not have throw rugs and other things on the floor that can make you trip. What can I do with my stairs? Do not leave any items on the stairs. Make sure that there are handrails on both sides of the stairs and use them. Fix handrails that are broken or loose. Make sure that handrails are as long as the stairways. Check any carpeting to make sure that it is firmly attached to the stairs. Fix any carpet that is loose or worn. Avoid having throw rugs at the top or bottom of the stairs. If you do have throw rugs, attach them to the floor with  carpet tape. Make sure that you have a light switch at the top of the stairs and the bottom of the stairs. If you do not have them, ask someone to add them for you. What else can I do to help prevent falls? Wear shoes that: Do not have high heels. Have rubber bottoms. Are comfortable and fit you well. Are closed at the toe. Do not wear sandals. If you use a stepladder: Make sure that it is fully opened. Do not climb a closed stepladder. Make sure that both sides of the stepladder are locked into place. Ask someone to hold it for you, if possible. Clearly mark and make sure that you can see: Any grab bars or handrails. First and last steps. Where the edge of each step is. Use tools that help you move  around (mobility aids) if they are needed. These include: Canes. Walkers. Scooters. Crutches. Turn on the lights when you go into a dark area. Replace any light bulbs as soon as they burn out. Set up your furniture so you have a clear path. Avoid moving your furniture around. If any of your floors are uneven, fix them. If there are any pets around you, be aware of where they are. Review your medicines with your doctor. Some medicines can make you feel dizzy. This can increase your chance of falling. Ask your doctor what other things that you can do to help prevent falls. This information is not intended to replace advice given to you by your health care provider. Make sure you discuss any questions you have with your health care provider. Document Released: 02/28/2009 Document Revised: 10/10/2015 Document Reviewed: 06/08/2014 Elsevier Interactive Patient Education  2017 [employer].

## 2020-12-18 NOTE — Progress Notes (Signed)
Subjective:   Kylie Davis is a 70 y.o. female who presents for Medicare Annual (Subsequent) preventive examination.  Review of Systems    N/a       Objective:    There were no vitals filed for this visit. There is no height or weight on file to calculate BMI.  Advanced Directives 01/31/2020 12/25/2019 03/03/2017 04/02/2014  Does Patient Have a Medical Advance Directive? No Yes Yes Yes  Type of Advance Directive - [place];Living will - [place]  Does patient want to make changes to medical advance directive? - No - Patient declined - -  Copy of [place] in Chart? - No - copy requested - -  Would patient like information on creating a medical advance directive? No - Patient declined - - -    Current Medications (verified) Outpatient Encounter Medications as of 12/18/2020  Medication Sig   aspirin 81 MG tablet Take 81 mg by mouth daily.   calcium-vitamin D (OSCAL-500) 500-400 MG-UNIT tablet Take 1 tablet by mouth daily.   losartan-hydrochlorothiazide (HYZAAR) 50-12.5 MG tablet TAKE 1 TABLET BY MOUTH DAILY   Multiple Vitamins-Minerals (MULTIVITAMIN PO) Take by mouth.   [DISCONTINUED] losartan (COZAAR) 50 MG tablet TAKE 1 TABLET(50 MG) BY MOUTH DAILY   No facility-administered encounter medications on file as of 12/18/2020.    Allergies (verified) Patient has no known allergies.   History: Past Medical History:  Diagnosis Date   Hypertension    Sleep apnea    C PAP   Past Surgical History:  Procedure Laterality Date   CHOLECYSTECTOMY  2004   Family History  Problem Relation Age of Onset   Cancer Mother 82       primary peritoneal cancer   Alzheimer's disease Mother    Cancer Father 40       stomach or pancreatic   Heart disease Father    Hypertension Brother    Heart attack Brother 1       MI x 2   Diabetes Paternal Grandmother    Breast cancer Paternal Aunt 77   Colon cancer Neg Hx    Social  History   Socioeconomic History   Marital status: Married    Spouse name: Not on file   Number of children: Not on file   Years of education: Not on file   Highest education level: Not on file  Occupational History   Occupation: retired Product manager: RETIRED  Tobacco Use   Smoking status: Never   Smokeless tobacco: Never  Substance and Sexual Activity   Alcohol use: Yes    Alcohol/week: 4.0 standard drinks    Types: 4 Standard drinks or equivalent per week    Comment: weekly   Drug use: No   Sexual activity: Yes    Birth control/protection: Post-menopausal    Comment: 1st intercourse 70 yo.---Fewer than 5 partners  Other Topics Concern   Not on file  Social History Narrative   Retired from being a Education officer, museum.    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 years    0 children    Social Determinants of Radio broadcast assistant Strain: Low Risk    Difficulty of Paying Living Expenses: Not hard at all  Food Insecurity: No Food Insecurity   Worried About Charity fundraiser in the Last Year: Never true   Arboriculturist in the Last Year: Never true  Transportation Needs: No Transportation Needs  Lack of Transportation (Medical): No   Lack of Transportation (Non-Medical): No  Physical Activity: Inactive   Days of Exercise per Week: 0 days   Minutes of Exercise per Session: 0 min  Stress: No Stress Concern Present   Feeling of Stress : Not at all  Social Connections: Moderately Integrated   Frequency of Communication with Friends and Family: More than three times a week   Frequency of Social Gatherings with Friends and Family: Twice a week   Attends Religious Services: More than 4 times per year   Active Member of [employer] or Organizations: No   Attends Archivist Meetings: Never   Marital Status: Married    Tobacco Counseling Counseling given: Not Answered   Clinical Intake:                 Diabetic?no         Activities of Daily Living In your present  state of health, do you have any difficulty performing the following activities: 12/25/2019  Hearing? N  Vision? N  Difficulty concentrating or making decisions? N  Walking or climbing stairs? N  Dressing or bathing? N  Doing errands, shopping? N  Preparing Food and eating ? N  Using the Toilet? N  In the past six months, have you accidently leaked urine? N  Do you have problems with loss of bowel control? N  Managing your Medications? N  Managing your Finances? N  Housekeeping or managing your Housekeeping? N  Some recent data might be hidden    Patient Care Team: [name], NP as PCP - General (Family Medicine)  Indicate any recent Medical Services you may have received from other than [facility] providers in the past year (date may be approximate).     Assessment:   This is a routine wellness examination for [name].  Hearing/Vision screen No results found.  Dietary issues and exercise activities discussed:     Goals Addressed   None    Depression Screen PHQ 2/9 Scores 12/25/2019 03/08/2018 03/03/2017 02/28/2015 04/02/2014  PHQ - 2 Score 0 0 0 0 0  PHQ- 9 Score 0 - - - -    Fall Risk Fall Risk  12/25/2019 03/08/2018 03/03/2017 02/28/2015 04/02/2014  Falls in the past year? 0 No No No No  Number falls in past yr: 0 - - - -  Injury with Fall? 0 - - - -  Risk for fall due to : Medication side effect;Orthopedic patient - - - -  Follow up Falls evaluation completed;Falls prevention discussed - - - -    FALL RISK PREVENTION PERTAINING TO THE HOME:  Any stairs in or around the home? No  If so, are there any without handrails? No  Home free of loose throw rugs in walkways, pet beds, electrical cords, etc? Yes  Adequate lighting in your home to reduce risk of falls? Yes   ASSISTIVE DEVICES UTILIZED TO PREVENT FALLS:  Life alert? No  Use of a cane, walker or w/c? No  Grab bars in the bathroom? No  Shower chair or bench in shower? No  Elevated toilet seat or a  handicapped toilet? No   TIMED UP AND GO:  Was the test performed? Yes .  Length of time to ambulate 10 feet: 10 sec.   Gait steady and fast without use of assistive device  Cognitive Function:    Normal cognitive status assessed by direct observation by this Nurse Health Advisor. No abnormalities found.  Immunizations Immunization History  Administered Date(s) Administered   Influenza Split 02/25/2012, 03/18/2013, 02/02/2014   Influenza, High Dose Seasonal PF 02/28/2015, 02/20/2016, 03/03/2017, 03/08/2018, 03/17/2019   Moderna SARS-COV2 Booster Vaccination 05/10/2020   Moderna Sars-Covid-2 Vaccination 07/22/2019, 08/22/2019   Pneumococcal Conjugate-13 02/28/2015   Pneumococcal Polysaccharide-23 02/20/2016   Td 05/19/2003   Tdap 09/13/2013   Zoster Recombinat (Shingrix) 06/25/2018, 11/06/2018   Zoster, Live 12/15/2010    TDAP status: Up to date  Flu Vaccine status: Up to date  Pneumococcal vaccine status: Up to date  Covid-19 vaccine status: Completed vaccines  Qualifies for Shingles Vaccine? Yes   Zostavax completed No   Shingrix Completed?: No.    Education has been provided regarding the importance of this vaccine. Patient has been advised to call insurance company to determine out of pocket expense if they have not yet received this vaccine. Advised may also receive vaccine at local pharmacy or Health Dept. Verbalized acceptance and understanding.  Screening Tests Health Maintenance  Topic Date Due   OPHTHALMOLOGY EXAM  Never done   HEMOGLOBIN A1C  03/08/2020   COVID-19 Vaccine (4 - Booster for Moderna series) 09/08/2020   INFLUENZA VACCINE  12/16/2020   MAMMOGRAM  05/02/2022   COLONOSCOPY (Pts 45-58yr Insurance coverage will need to be confirmed)  02/25/2023   TETANUS/TDAP  09/14/2023   DEXA SCAN  Completed   Hepatitis C Screening  Completed   PNA vac Low Risk Adult  Completed   Zoster Vaccines- Shingrix  Completed   HPV VACCINES  Aged Out   FOOT EXAM   Discontinued    Health Maintenance  Health Maintenance Due  Topic Date Due   OPHTHALMOLOGY EXAM  Never done   HEMOGLOBIN A1C  03/08/2020   COVID-19 Vaccine (4 - Booster for Moderna series) 09/08/2020   INFLUENZA VACCINE  12/16/2020    Colorectal cancer screening: Type of screening: Colonoscopy. Completed 02/24/2013. Repeat every 10 years  Mammogram status: Completed 05/02/2013. Repeat every year  Bone Density status: Completed 04/21/2016. Results reflect: Bone density results: OSTEOPENIA. Repeat every 5 years.  Lung Cancer Screening: (Low Dose CT Chest recommended if Age 71-80years, 30 pack-year currently smoking OR have quit w/in 15years.) does not qualify.   Lung Cancer Screening Referral: n/a  Additional Screening:  Hepatitis C Screening: does not qualify; Completed 03/03/2017  Vision Screening: Recommended annual ophthalmology exams for early detection of glaucoma and other disorders of the eye. Is the patient up to date with their annual eye exam?  Yes  Who is the provider or what is the name of the office in which the patient attends annual eye exams? Dr.omen If pt is not established with a provider, would they like to be referred to a provider to establish care? No .   Dental Screening: Recommended annual dental exams for proper oral hygiene  Community Resource Referral / Chronic Care Management: CRR required this visit?  No   CCM required this visit?  No      Plan:     I have personally reviewed and noted the following in the patient's chart:   Medical and social history Use of alcohol, tobacco or illicit drugs  Current medications and supplements including opioid prescriptions.  Functional ability and status Nutritional status Physical activity Advanced directives List of other physicians Hospitalizations, surgeries, and ER visits in previous 12 months Vitals Screenings to include cognitive, depression, and falls Referrals and appointments  In  addition, I have reviewed and discussed with patient certain preventive protocols, quality metrics,  and best practice recommendations. A written personalized care plan for preventive services as well as general preventive health recommendations were provided to patient.     Randel Pigg, LPN   [id number]   Nurse Notes: none

## 2020-12-30 DIAGNOSIS — G4733 Obstructive sleep apnea (adult) (pediatric): Secondary | ICD-10-CM | POA: Diagnosis not present

## 2021-01-08 ENCOUNTER — Other Ambulatory Visit: Payer: Self-pay | Admitting: Adult Health

## 2021-01-12 ENCOUNTER — Other Ambulatory Visit: Payer: Self-pay | Admitting: Adult Health

## 2021-01-13 ENCOUNTER — Other Ambulatory Visit: Payer: Self-pay | Admitting: Adult Health

## 2021-01-13 DIAGNOSIS — Z1231 Encounter for screening mammogram for malignant neoplasm of breast: Secondary | ICD-10-CM

## 2021-01-30 ENCOUNTER — Encounter: Payer: Medicare PPO | Admitting: Adult Health

## 2021-02-03 ENCOUNTER — Other Ambulatory Visit: Payer: Self-pay

## 2021-02-04 ENCOUNTER — Ambulatory Visit (INDEPENDENT_AMBULATORY_CARE_PROVIDER_SITE_OTHER): Payer: Medicare PPO | Admitting: Adult Health

## 2021-02-04 ENCOUNTER — Encounter: Payer: Self-pay | Admitting: Adult Health

## 2021-02-04 VITALS — BP 136/82 | HR 73 | Temp 98.6°F | Ht 63.75 in | Wt 245.0 lb

## 2021-02-04 DIAGNOSIS — E7439 Other disorders of intestinal carbohydrate absorption: Secondary | ICD-10-CM

## 2021-02-04 DIAGNOSIS — Z0000 Encounter for general adult medical examination without abnormal findings: Secondary | ICD-10-CM | POA: Diagnosis not present

## 2021-02-04 DIAGNOSIS — R208 Other disturbances of skin sensation: Secondary | ICD-10-CM | POA: Diagnosis not present

## 2021-02-04 DIAGNOSIS — I10 Essential (primary) hypertension: Secondary | ICD-10-CM

## 2021-02-04 DIAGNOSIS — G4733 Obstructive sleep apnea (adult) (pediatric): Secondary | ICD-10-CM

## 2021-02-04 DIAGNOSIS — E668 Other obesity: Secondary | ICD-10-CM | POA: Diagnosis not present

## 2021-02-04 DIAGNOSIS — B351 Tinea unguium: Secondary | ICD-10-CM | POA: Diagnosis not present

## 2021-02-04 LAB — CBC WITH DIFFERENTIAL/PLATELET
Basophils Absolute: 0.1 10*3/uL (ref 0.0–0.1)
Basophils Relative: 0.9 % (ref 0.0–3.0)
Eosinophils Absolute: 0.2 10*3/uL (ref 0.0–0.7)
Eosinophils Relative: 3.3 % (ref 0.0–5.0)
HCT: 40 % (ref 36.0–46.0)
Hemoglobin: 13.3 g/dL (ref 12.0–15.0)
Lymphocytes Relative: 37.6 % (ref 12.0–46.0)
Lymphs Abs: 2.3 10*3/uL (ref 0.7–4.0)
MCHC: 33.3 g/dL (ref 30.0–36.0)
MCV: 88.4 fl (ref 78.0–100.0)
Monocytes Absolute: 0.5 10*3/uL (ref 0.1–1.0)
Monocytes Relative: 8.7 % (ref 3.0–12.0)
Neutro Abs: 3 10*3/uL (ref 1.4–7.7)
Neutrophils Relative %: 49.5 % (ref 43.0–77.0)
Platelets: 285 10*3/uL (ref 150.0–400.0)
RBC: 4.52 Mil/uL (ref 3.87–5.11)
RDW: 14.4 % (ref 11.5–15.5)
WBC: 6.1 10*3/uL (ref 4.0–10.5)

## 2021-02-04 LAB — COMPREHENSIVE METABOLIC PANEL
ALT: 27 U/L (ref 0–35)
AST: 21 U/L (ref 0–37)
Albumin: 4.2 g/dL (ref 3.5–5.2)
Alkaline Phosphatase: 64 U/L (ref 39–117)
BUN: 15 mg/dL (ref 6–23)
CO2: 30 mEq/L (ref 19–32)
Calcium: 9.3 mg/dL (ref 8.4–10.5)
Chloride: 103 mEq/L (ref 96–112)
Creatinine, Ser: 0.74 mg/dL (ref 0.40–1.20)
GFR: 81.42 mL/min (ref >60.00)
Glucose, Bld: 96 mg/dL (ref 70–99)
Potassium: 4.7 mEq/L (ref 3.5–5.1)
Sodium: 140 mEq/L (ref 135–145)
Total Bilirubin: 0.5 mg/dL (ref 0.2–1.2)
Total Protein: 7.6 g/dL (ref 6.0–8.3)

## 2021-02-04 LAB — TSH: TSH: 4.03 u[IU]/mL (ref 0.35–5.50)

## 2021-02-04 LAB — LIPID PANEL
Cholesterol: 171 mg/dL (ref 0–200)
HDL: 48.5 mg/dL (ref >39.00)
LDL Cholesterol: 99 mg/dL (ref 0–99)
NonHDL: 122.05
Total CHOL/HDL Ratio: 4
Triglycerides: 115 mg/dL (ref 0.0–149.0)
VLDL: 23 mg/dL (ref 0.0–40.0)

## 2021-02-04 LAB — HEMOGLOBIN A1C: Hgb A1c MFr Bld: 6.1 % (ref 4.6–6.5)

## 2021-02-04 NOTE — Progress Notes (Signed)
Subjective:    Patient ID: Kylie Davis, female    DOB: [birth year], 69 y.o.   MRN: [id number]  HPI Patient presents for yearly preventative medicine examination. She is a pleasant 70 year old female who  has a past medical history of Hypertension and Sleep apnea.  Hypertension -prescribed Hyzaar 50-12.5 mg daily.  She denies dizziness, lightheadedness, chest pain, or shortness of breath.  OSA -is followed by pulmonary.  She does wear her mask every night, gets around 6 to 7 hours.  Denies feeling fatigued or any significant daytime somnolence.  Obesity -is exercising with aerobic exercise multiple times a week and is trying to eat healthy.  She is having a hard time losing weight. Wt Readings from Last 3 Encounters:  02/04/21 245 lb (111.1 kg)  12/18/20 244 lb (110.7 kg)  09/19/20 246 lb 6.4 oz (111.8 kg)   Glucose Intolerance- not on any medication currently  Lab Results  Component Value Date   HGBA1C 6.3 09/07/2019   ? Neuropathy -reports that over the last few months the bottoms of bilateral feet have been burning.  Burning is pretty constant.  Not worse at night.  All immunizations and health maintenance protocols were reviewed with the patient and needed orders were placed.  Appropriate screening laboratory values were ordered for the patient including screening of hyperlipidemia, renal function and hepatic function.  Medication reconciliation,  past medical history, social history, problem list and allergies were reviewed in detail with the patient  Goals were established with regard to weight loss, exercise, and  diet in compliance with medications  Wt Readings from Last 3 Encounters:  02/04/21 245 lb (111.1 kg)  12/18/20 244 lb (110.7 kg)  09/19/20 246 lb 6.4 oz (111.8 kg)   Review of Systems  Constitutional: Negative.   HENT: Negative.    Eyes: Negative.   Respiratory: Negative.    Cardiovascular: Negative.   Gastrointestinal: Negative.   Endocrine:  Negative.   Genitourinary: Negative.   Musculoskeletal: Negative.   Skin: Negative.   Allergic/Immunologic: Negative.   Neurological: Negative.   Hematological: Negative.   Psychiatric/Behavioral: Negative.     Past Medical History:  Diagnosis Date   Hypertension    Sleep apnea    C PAP    Social History   Socioeconomic History   Marital status: Married    Spouse name: Not on file   Number of children: Not on file   Years of education: Not on file   Highest education level: Not on file  Occupational History   Occupation: retired Product manager: RETIRED  Tobacco Use   Smoking status: Never   Smokeless tobacco: Never  Substance and Sexual Activity   Alcohol use: Yes    Alcohol/week: 4.0 standard drinks    Types: 4 Standard drinks or equivalent per week    Comment: weekly   Drug use: No   Sexual activity: Yes    Birth control/protection: Post-menopausal    Comment: 1st intercourse 70 yo.---Fewer than 5 partners  Other Topics Concern   Not on file  Social History Narrative   Retired from being a Education officer, museum.    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 years    0 children    Social Determinants of Radio broadcast assistant Strain: Low Risk    Difficulty of Paying Living Expenses: Not hard at all  Food Insecurity: Not on file  Transportation Needs: No Transportation Needs   Lack of Transportation (Medical): No   Lack of  Transportation (Non-Medical): No  Physical Activity: Sufficiently Active   Days of Exercise per Week: 4 days   Minutes of Exercise per Session: 40 min  Stress: Not on file  Social Connections: Socially Integrated   Frequency of Communication with Friends and Family: Three times a week   Frequency of Social Gatherings with Friends and Family: Three times a week   Attends Religious Services: More than 4 times per year   Active Member of Clubs or Organizations: Yes   Attends Music therapist: More than 4 times per year   Marital Status: Married   Human resources officer Violence: Not At Risk   Fear of Current or Ex-Partner: No   Emotionally Abused: No   Physically Abused: No   Sexually Abused: No    Past Surgical History:  Procedure Laterality Date   CHOLECYSTECTOMY  2004    Family History  Problem Relation Age of Onset   Cancer Mother 86       primary peritoneal cancer   Alzheimer's disease Mother    Cancer Father 39       stomach or pancreatic   Heart disease Father    Hypertension Brother    Heart attack Brother 3       MI x 2   Diabetes Paternal Grandmother    Breast cancer Paternal Aunt 7   Colon cancer Neg Hx     No Known Allergies  Current Outpatient Medications on File Prior to Visit  Medication Sig Dispense Refill   aspirin 81 MG tablet Take 81 mg by mouth daily.     calcium-vitamin D (OSCAL-500) 500-400 MG-UNIT tablet Take 1 tablet by mouth daily.     losartan-hydrochlorothiazide (HYZAAR) 50-12.5 MG tablet TAKE 1 TABLET BY MOUTH DAILY 90 tablet 0   Multiple Vitamins-Minerals (MULTIVITAMIN PO) Take by mouth.     [DISCONTINUED] losartan (COZAAR) 50 MG tablet TAKE 1 TABLET(50 MG) BY MOUTH DAILY 90 tablet 1   No current facility-administered medications on file prior to visit.    BP 136/82   Pulse 73   Temp 98.6 F (37 C) (Oral)   Ht 5' 3.75" (1.619 m)   Wt 245 lb (111.1 kg)   SpO2 97%   BMI 42.38 kg/m        Objective:   Physical Exam Vitals and nursing note reviewed.  Constitutional:      General: She is not in acute distress.    Appearance: Normal appearance. She is well-developed. She is obese. She is not ill-appearing.  HENT:     Head: Normocephalic and atraumatic.     Right Ear: Tympanic membrane, ear canal and external ear normal. There is no impacted cerumen.     Left Ear: Tympanic membrane, ear canal and external ear normal. There is no impacted cerumen.     Nose: Nose normal. No congestion or rhinorrhea.     Mouth/Throat:     Mouth: Mucous membranes are moist.     Pharynx:  Oropharynx is clear. No oropharyngeal exudate or posterior oropharyngeal erythema.  Eyes:     General:        Right eye: No discharge.        Left eye: No discharge.     Extraocular Movements: Extraocular movements intact.     Conjunctiva/sclera: Conjunctivae normal.     Pupils: Pupils are equal, round, and reactive to light.  Neck:     Thyroid: No thyromegaly.     Vascular: No carotid bruit.     Trachea:  No tracheal deviation.  Cardiovascular:     Rate and Rhythm: Normal rate and regular rhythm.     Pulses: Normal pulses.          Posterior tibial pulses are 2+ on the right side and 2+ on the left side.     Heart sounds: Normal heart sounds. No murmur heard.   No friction rub. No gallop.  Pulmonary:     Effort: Pulmonary effort is normal. No respiratory distress.     Breath sounds: Normal breath sounds. No stridor. No wheezing, rhonchi or rales.  Chest:     Chest wall: No tenderness.  Abdominal:     General: Abdomen is flat. Bowel sounds are normal. There is no distension.     Palpations: Abdomen is soft. There is no mass.     Tenderness: There is no abdominal tenderness. There is no right CVA tenderness, left CVA tenderness, guarding or rebound.     Hernia: No hernia is present.  Musculoskeletal:        General: No swelling, tenderness, deformity or signs of injury.     Cervical back: Normal range of motion and neck supple.     Right lower leg: No edema.     Left lower leg: No edema.     Right foot: Decreased range of motion. No deformity.     Left foot: Normal range of motion. No deformity.  Feet:     Right foot:     Skin integrity: Skin integrity normal.     Left foot:     Toenail Condition: Fungal disease present. Lymphadenopathy:     Cervical: No cervical adenopathy.  Skin:    General: Skin is warm and dry.     Capillary Refill: Capillary refill takes less than 2 seconds.     Coloration: Skin is not jaundiced or pale.     Findings: No bruising, erythema, lesion or  rash.  Neurological:     General: No focal deficit present.     Mental Status: She is alert and oriented to person, place, and time.     Cranial Nerves: No cranial nerve deficit.     Sensory: No sensory deficit.     Motor: No weakness.     Coordination: Coordination normal.     Gait: Gait normal.     Deep Tendon Reflexes: Reflexes normal.  Psychiatric:        Mood and Affect: Mood normal.        Behavior: Behavior normal.        Thought Content: Thought content normal.        Judgment: Judgment normal.       Assessment & Plan:   1. Routine general medical examination at a health care facility - Follow up in one year or sooner if needed - CBC with Differential/Platelet; Future - Comprehensive metabolic panel; Future - Hemoglobin A1c; Future - TSH; Future - Lipid panel; Future - Lipid panel - TSH - Hemoglobin A1c - Comprehensive metabolic panel - CBC with Differential/Platelet  2. Essential hypertension, benign - Controlled. No change in medications  - CBC with Differential/Platelet; Future - Comprehensive metabolic panel; Future - Hemoglobin A1c; Future - TSH; Future - Lipid panel; Future - Lipid panel - TSH - Hemoglobin A1c - Comprehensive metabolic panel - CBC with Differential/Platelet  3. OSA (obstructive sleep apnea) - Follow up with pulmonary as directed - CBC with Differential/Platelet; Future - Comprehensive metabolic panel; Future - Hemoglobin A1c; Future - TSH; Future - Lipid panel;  Future - Lipid panel - TSH - Hemoglobin A1c - Comprehensive metabolic panel - CBC with Differential/Platelet  4. Glucose intolerance - Consider adding agent  - CBC with Differential/Platelet; Future - Comprehensive metabolic panel; Future - Hemoglobin A1c; Future - TSH; Future - Lipid panel; Future - Lipid panel - TSH - Hemoglobin A1c - Comprehensive metabolic panel - CBC with Differential/Platelet  5. Other obesity - Consider Victoza or Wegovy - CBC with  Differential/Platelet; Future - Comprehensive metabolic panel; Future - Hemoglobin A1c; Future - TSH; Future - Lipid panel; Future - Lipid panel - TSH - Hemoglobin A1c - Comprehensive metabolic panel - CBC with Differential/Platelet  6. Toenail fungus - Will send in lamisil oral if liver panel is ok   7. Burning sensation of feet ? Neuropathy  - Does not want gabapentin at this time - Will work on weight loss - CBC with Differential/Platelet; Future - Comprehensive metabolic panel; Future - Hemoglobin A1c; Future - TSH; Future - Lipid panel; Future  [name], NP

## 2021-02-04 NOTE — Patient Instructions (Signed)
Health Maintenance Due  Topic Date Due   OPHTHALMOLOGY EXAM  Never done   HEMOGLOBIN A1C  03/08/2020   COVID-19 Vaccine (4 - Booster for Moderna series) 09/08/2020   INFLUENZA VACCINE  12/16/2020    Depression screen [facility] 2/9 12/18/2020 12/25/2019 03/08/2018  Decreased Interest 0 0 0  Down, Depressed, Hopeless 0 0 0  PHQ - 2 Score 0 0 0  Altered sleeping - 0 -  Tired, decreased energy - 0 -  Change in appetite - 0 -  Feeling bad or failure about yourself  - 0 -  Trouble concentrating - 0 -  Moving slowly or fidgety/restless - 0 -  Suicidal thoughts - 0 -  PHQ-9 Score - 0 -  Difficult doing work/chores - Not difficult at all -

## 2021-02-05 ENCOUNTER — Other Ambulatory Visit: Payer: Self-pay | Admitting: Adult Health

## 2021-02-05 DIAGNOSIS — R7301 Impaired fasting glucose: Secondary | ICD-10-CM

## 2021-02-05 MED ORDER — TRULICITY 0.75 MG/0.5ML ~~LOC~~ SOAJ: 0.7500 mg | SUBCUTANEOUS | 0 refills | Status: DC

## 2021-02-19 ENCOUNTER — Encounter: Payer: Self-pay | Admitting: Adult Health

## 2021-02-19 ENCOUNTER — Telehealth: Payer: Self-pay | Admitting: Adult Health

## 2021-02-19 NOTE — Telephone Encounter (Signed)
Patient called wanting a message sent to [facility] letting him know that she did receive her prescription for Trulicity. She says that she never received her other prescription for lamisil. She is needing that prescription sent in.  Please advise.

## 2021-02-19 NOTE — Telephone Encounter (Signed)
Please advise 

## 2021-02-20 ENCOUNTER — Other Ambulatory Visit: Payer: Self-pay | Admitting: Adult Health

## 2021-02-20 MED ORDER — TERBINAFINE HCL 250 MG PO TABS: 250.0000 mg | ORAL_TABLET | Freq: Every day | ORAL | 0 refills | Status: DC

## 2021-02-20 NOTE — Telephone Encounter (Signed)
Requested medication filled to pharmacy on file.

## 2021-02-20 NOTE — Telephone Encounter (Signed)
Please advise 

## 2021-03-12 ENCOUNTER — Other Ambulatory Visit: Payer: Self-pay | Admitting: Adult Health

## 2021-03-12 DIAGNOSIS — R7301 Impaired fasting glucose: Secondary | ICD-10-CM

## 2021-03-24 ENCOUNTER — Other Ambulatory Visit: Payer: Self-pay

## 2021-03-25 ENCOUNTER — Ambulatory Visit: Payer: Medicare PPO | Admitting: Adult Health

## 2021-03-25 ENCOUNTER — Other Ambulatory Visit: Payer: Self-pay | Admitting: Adult Health

## 2021-03-25 ENCOUNTER — Ambulatory Visit: Payer: Medicare PPO

## 2021-03-25 ENCOUNTER — Encounter: Payer: Self-pay | Admitting: Adult Health

## 2021-03-25 VITALS — BP 120/74 | HR 65 | Temp 98.0°F | Ht 63.75 in | Wt 230.0 lb

## 2021-03-25 DIAGNOSIS — I480 Paroxysmal atrial fibrillation: Secondary | ICD-10-CM

## 2021-03-25 DIAGNOSIS — I499 Cardiac arrhythmia, unspecified: Secondary | ICD-10-CM | POA: Diagnosis not present

## 2021-03-25 DIAGNOSIS — R7301 Impaired fasting glucose: Secondary | ICD-10-CM

## 2021-03-25 MED ORDER — TRULICITY 0.75 MG/0.5ML ~~LOC~~ SOAJ: SUBCUTANEOUS | 1 refills | Status: DC

## 2021-03-25 NOTE — Progress Notes (Signed)
Subjective:    Patient ID: Kylie Davis, female    DOB: June 14, 1950, 70 y.o.   MRN: [id number]  HPI 70 year old female who  has a past medical history of Hypertension and Sleep apnea.  She reports that her Apple Watch alerted her to possible atrial fibrillation episode yesterday morning when she was sitting down drinking her coffee. Her heart rate was between 60-121. She denies palpitations or chest pain during this episodes that lasted about 1 hour.   She has not had any episodes since   She is wearing her CPAP   Lab work including TSH done about 6 weeks ago was normal   She also needs a refill of trulicity. She has lost about 15 pounds since starting the medication. Denies side effects   Wt Readings from Last 3 Encounters:  03/25/21 230 lb (104.3 kg)  02/04/21 245 lb (111.1 kg)  12/18/20 244 lb (110.7 kg)   Review of Systems See HPI   Past Medical History:  Diagnosis Date   Hypertension    Sleep apnea    C PAP    Social History   Socioeconomic History   Marital status: Married    Spouse name: Not on file   Number of children: Not on file   Years of education: Not on file   Highest education level: Not on file  Occupational History   Occupation: retired Product manager: RETIRED  Tobacco Use   Smoking status: Never   Smokeless tobacco: Never  Substance and Sexual Activity   Alcohol use: Yes    Alcohol/week: 4.0 standard drinks    Types: 4 Standard drinks or equivalent per week    Comment: weekly   Drug use: No   Sexual activity: Yes    Birth control/protection: Post-menopausal    Comment: 1st intercourse 70 yo.---Fewer than 5 partners  Other Topics Concern   Not on file  Social History Narrative   Retired from being a Education officer, museum.    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 years    0 children    Social Determinants of Radio broadcast assistant Strain: Low Risk    Difficulty of Paying Living Expenses: Not hard at all  Food Insecurity: Not on file   Transportation Needs: No Transportation Needs   Lack of Transportation (Medical): No   Lack of Transportation (Non-Medical): No  Physical Activity: Sufficiently Active   Days of Exercise per Week: 4 days   Minutes of Exercise per Session: 40 min  Stress: Not on file  Social Connections: Socially Integrated   Frequency of Communication with Friends and Family: Three times a week   Frequency of Social Gatherings with Friends and Family: Three times a week   Attends Religious Services: More than 4 times per year   Active Member of Clubs or Organizations: Yes   Attends Music therapist: More than 4 times per year   Marital Status: Married  Human resources officer Violence: Not At Risk   Fear of Current or Ex-Partner: No   Emotionally Abused: No   Physically Abused: No   Sexually Abused: No    Past Surgical History:  Procedure Laterality Date   CHOLECYSTECTOMY  2004    Family History  Problem Relation Age of Onset   Cancer Mother 72       primary peritoneal cancer   Alzheimer's disease Mother    Cancer Father 75       stomach or pancreatic   Heart disease Father  Hypertension Brother    Heart attack Brother 35       MI x 2   Diabetes Paternal Grandmother    Breast cancer Paternal Aunt 1   Colon cancer Neg Hx     No Known Allergies  Current Outpatient Medications on File Prior to Visit  Medication Sig Dispense Refill   aspirin 81 MG tablet Take 81 mg by mouth daily.     calcium-vitamin D (OSCAL-500) 500-400 MG-UNIT tablet Take 1 tablet by mouth daily.     losartan-hydrochlorothiazide (HYZAAR) 50-12.5 MG tablet TAKE 1 TABLET BY MOUTH DAILY 90 tablet 0   Multiple Vitamins-Minerals (MULTIVITAMIN PO) Take by mouth.     terbinafine (LAMISIL) 250 MG tablet Take 1 tablet (250 mg total) by mouth daily. 90 tablet 0   TRULICITY 2.02 RK/2.7CW SOPN ADMINISTER 0.75 MG UNDER THE SKIN 1 TIME A WEEK 6 mL 0   [DISCONTINUED] losartan (COZAAR) 50 MG tablet TAKE 1 TABLET(50 MG)  BY MOUTH DAILY 90 tablet 1   No current facility-administered medications on file prior to visit.    BP 120/74   Pulse 65   Temp 98 F (36.7 C) (Oral)   Ht 5' 3.75" (1.619 m)   Wt 230 lb (104.3 kg)   SpO2 97%   BMI 39.79 kg/m       Objective:   Physical Exam Vitals and nursing note reviewed.  Constitutional:      Appearance: Normal appearance.  Cardiovascular:     Rate and Rhythm: Normal rate and regular rhythm.     Pulses: Normal pulses.     Heart sounds: Normal heart sounds.  Pulmonary:     Effort: Pulmonary effort is normal.     Breath sounds: Normal breath sounds.  Skin:    General: Skin is warm and dry.  Neurological:     General: No focal deficit present.     Mental Status: She is alert and oriented to person, place, and time.  Psychiatric:        Mood and Affect: Mood normal.        Behavior: Behavior normal.        Thought Content: Thought content normal.        Judgment: Judgment normal.      Assessment & Plan:  1. Cardiac arrhythmia, unspecified cardiac arrhythmia type  - EKG 12-Lead- Sinus Rhythm, Rate 64. Will order cardiac monitor to r/o PAF.  - LONG TERM MONITOR (3-14 DAYS); Future  2. Impaired fasting glucose  - Dulaglutide (TRULICITY) 2.37 SE/8.3TD SOPN; ADMINISTER 0.75 MG UNDER THE SKIN 1 TIME A WEEK  Dispense: 6 mL; Refill: 1  [name], NP

## 2021-03-25 NOTE — Progress Notes (Unsigned)
Patient enrolled for [employer] to mail a 14 day ZIO XT monitor to her address on file.

## 2021-03-27 DIAGNOSIS — D225 Melanocytic nevi of trunk: Secondary | ICD-10-CM | POA: Diagnosis not present

## 2021-03-27 DIAGNOSIS — L814 Other melanin hyperpigmentation: Secondary | ICD-10-CM | POA: Diagnosis not present

## 2021-03-27 DIAGNOSIS — L821 Other seborrheic keratosis: Secondary | ICD-10-CM | POA: Diagnosis not present

## 2021-03-27 DIAGNOSIS — D485 Neoplasm of uncertain behavior of skin: Secondary | ICD-10-CM | POA: Diagnosis not present

## 2021-04-08 ENCOUNTER — Encounter: Payer: Self-pay | Admitting: Adult Health

## 2021-04-08 ENCOUNTER — Ambulatory Visit: Payer: Medicare PPO | Admitting: Adult Health

## 2021-04-08 VITALS — BP 112/80 | HR 76 | Temp 97.1°F | Ht 63.75 in | Wt 229.0 lb

## 2021-04-08 DIAGNOSIS — Z95818 Presence of other cardiac implants and grafts: Secondary | ICD-10-CM

## 2021-04-08 DIAGNOSIS — B379 Candidiasis, unspecified: Secondary | ICD-10-CM

## 2021-04-08 MED ORDER — CLOTRIMAZOLE-BETAMETHASONE 1-0.05 % EX CREA: 1.0000 "application " | TOPICAL_CREAM | Freq: Two times a day (BID) | CUTANEOUS | 0 refills | Status: DC

## 2021-04-08 NOTE — Progress Notes (Signed)
Subjective:    Patient ID: Kylie Davis, female    DOB: [birth year], 70 y.o.   MRN: [id number]  HPI 70 year old female who  has a past medical history of Hypertension and Sleep apnea.  She presents to the office today for 2 separate issues  First issue is that of a rash under her right breast, first noticed a few days ago.  Rash is red, flat, and itchy.  Has not been using anything over-the-counter  He also received her Zio patch in the mail.  We ordered this because her apple watch alerted her to possible atrial fibrillation episode at the beginning of November.  She has not had any symptoms since.  She was unsure exactly how to place a Zio patch so she brought it in today for [place] to do.  Review of Systems See HPI   Past Medical History:  Diagnosis Date   Hypertension    Sleep apnea    C PAP    Social History   Socioeconomic History   Marital status: Married    Spouse name: Not on file   Number of children: Not on file   Years of education: Not on file   Highest education level: Not on file  Occupational History   Occupation: retired Product manager: RETIRED  Tobacco Use   Smoking status: Never   Smokeless tobacco: Never  Substance and Sexual Activity   Alcohol use: Yes    Alcohol/week: 4.0 standard drinks    Types: 4 Standard drinks or equivalent per week    Comment: weekly   Drug use: No   Sexual activity: Yes    Birth control/protection: Post-menopausal    Comment: 1st intercourse 70 yo.---Fewer than 5 partners  Other Topics Concern   Not on file  Social History Narrative   Retired from being a Education officer, museum.    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 years    0 children    Social Determinants of Radio broadcast assistant Strain: Low Risk    Difficulty of Paying Living Expenses: Not hard at all  Food Insecurity: Not on file  Transportation Needs: No Transportation Needs   Lack of Transportation (Medical): No   Lack of Transportation (Non-Medical): No  Physical  Activity: Sufficiently Active   Days of Exercise per Week: 4 days   Minutes of Exercise per Session: 40 min  Stress: Not on file  Social Connections: Socially Integrated   Frequency of Communication with Friends and Family: Three times a week   Frequency of Social Gatherings with Friends and Family: Three times a week   Attends Religious Services: More than 4 times per year   Active Member of Clubs or Organizations: Yes   Attends Music therapist: More than 4 times per year   Marital Status: Married  Human resources officer Violence: Not At Risk   Fear of Current or Ex-Partner: No   Emotionally Abused: No   Physically Abused: No   Sexually Abused: No    Past Surgical History:  Procedure Laterality Date   CHOLECYSTECTOMY  2004    Family History  Problem Relation Age of Onset   Cancer Mother 39       primary peritoneal cancer   Alzheimer's disease Mother    Cancer Father 73       stomach or pancreatic   Heart disease Father    Hypertension Brother    Heart attack Brother 66       MI x  2   Diabetes Paternal Grandmother    Breast cancer Paternal Aunt 74   Colon cancer Neg Hx     No Known Allergies  Current Outpatient Medications on File Prior to Visit  Medication Sig Dispense Refill   aspirin 81 MG tablet Take 81 mg by mouth daily.     calcium-vitamin D (OSCAL-500) 500-400 MG-UNIT tablet Take 1 tablet by mouth daily.     Dulaglutide (TRULICITY) 4.08 XK/4.8JE SOPN ADMINISTER 0.75 MG UNDER THE SKIN 1 TIME A WEEK 6 mL 1   losartan-hydrochlorothiazide (HYZAAR) 50-12.5 MG tablet TAKE 1 TABLET BY MOUTH DAILY 90 tablet 0   Multiple Vitamins-Minerals (MULTIVITAMIN PO) Take by mouth.     terbinafine (LAMISIL) 250 MG tablet Take 1 tablet (250 mg total) by mouth daily. 90 tablet 0   [DISCONTINUED] losartan (COZAAR) 50 MG tablet TAKE 1 TABLET(50 MG) BY MOUTH DAILY 90 tablet 1   No current facility-administered medications on file prior to visit.    BP 112/80   Pulse 76    Temp (!) 97.1 F (36.2 C) (Oral)   Ht 5' 3.75" (1.619 m)   Wt 229 lb (103.9 kg)   SpO2 94%   BMI 39.62 kg/m       Objective:   Physical Exam Vitals and nursing note reviewed.  Constitutional:      Appearance: Normal appearance.  Skin:    General: Skin is warm and dry.     Capillary Refill: Capillary refill takes less than 2 seconds.     Findings: Rash present.     Comments: Red flat rash noted under right breast.  Neurological:     General: No focal deficit present.     Mental Status: She is alert and oriented to person, place, and time.  Psychiatric:        Mood and Affect: Mood normal.        Behavior: Behavior normal.        Thought Content: Thought content normal.        Judgment: Judgment normal.      Assessment & Plan:  1. Presence of cardiac device -Zio patch applied without difficulty to left chest wall.   Zio patch was started.  She knows to remove the Zio patch at the end of 14-day.  And send back in box that was provided.  2. Yeast infection  - clotrimazole-betamethasone (LOTRISONE) cream; Apply 1 application topically 2 (two) times daily.  Dispense: 30 g; Refill: 0  [name], NP

## 2021-04-22 DIAGNOSIS — L905 Scar conditions and fibrosis of skin: Secondary | ICD-10-CM | POA: Diagnosis not present

## 2021-04-22 DIAGNOSIS — D2261 Melanocytic nevi of right upper limb, including shoulder: Secondary | ICD-10-CM | POA: Diagnosis not present

## 2021-05-07 ENCOUNTER — Ambulatory Visit
Admission: RE | Admit: 2021-05-07 | Discharge: 2021-05-07 | Disposition: A | Discharge status: Home / Self Care | Payer: Medicare PPO | Source: Ambulatory Visit | Attending: Adult Health | Admitting: Adult Health

## 2021-05-07 DIAGNOSIS — Z1231 Encounter for screening mammogram for malignant neoplasm of breast: Secondary | ICD-10-CM | POA: Diagnosis not present

## 2021-05-08 ENCOUNTER — Telehealth: Payer: Self-pay | Admitting: Adult Health

## 2021-05-08 ENCOUNTER — Other Ambulatory Visit: Payer: Self-pay | Admitting: Adult Health

## 2021-05-08 DIAGNOSIS — R928 Other abnormal and inconclusive findings on diagnostic imaging of breast: Secondary | ICD-10-CM

## 2021-05-08 NOTE — Telephone Encounter (Signed)
Pt is calling and has spoken with [facility] phone number [phone] maybe able to do sooner appt .They will need order to be fax

## 2021-05-08 NOTE — Telephone Encounter (Signed)
Patient called because she stated that her mammogram came back abnormal and now she needs another appointment for an ultrasound ( patient stated an ultrasound but said she wasn't sure if that was it), but the breast center does not have any appointments that she can be scheduled for before she leaves January 1st to go to the [facility] for four months. Patient wants to have Tommi Rumps find her another office that can do what she needs done before she leaves. Patient requested a to speak with him, but I let her know that he was seeing patients. I let her know I could send a message back, but I did not know when he would give her a call.

## 2021-05-08 NOTE — Telephone Encounter (Signed)
Notified pt that we could refer to [facility], but may not be any sooner. Pt decided to just stick with the breast center and see if they have a cancellation list.

## 2021-05-11 ENCOUNTER — Encounter: Payer: Self-pay | Admitting: Adult Health

## 2021-05-12 ENCOUNTER — Other Ambulatory Visit: Payer: Self-pay | Admitting: Adult Health

## 2021-05-13 ENCOUNTER — Ambulatory Visit
Admission: RE | Admit: 2021-05-13 | Discharge: 2021-05-13 | Disposition: A | Discharge status: Home / Self Care | Payer: Medicare PPO | Source: Ambulatory Visit | Attending: Adult Health | Admitting: Adult Health

## 2021-05-13 ENCOUNTER — Ambulatory Visit (INDEPENDENT_AMBULATORY_CARE_PROVIDER_SITE_OTHER): Payer: Medicare PPO | Admitting: Adult Health

## 2021-05-13 ENCOUNTER — Encounter: Payer: Self-pay | Admitting: Adult Health

## 2021-05-13 ENCOUNTER — Other Ambulatory Visit: Payer: Self-pay | Admitting: Adult Health

## 2021-05-13 VITALS — BP 140/86 | HR 78 | Temp 98.0°F | Ht 63.75 in | Wt 228.0 lb

## 2021-05-13 DIAGNOSIS — R928 Other abnormal and inconclusive findings on diagnostic imaging of breast: Secondary | ICD-10-CM

## 2021-05-13 DIAGNOSIS — R922 Inconclusive mammogram: Secondary | ICD-10-CM | POA: Diagnosis not present

## 2021-05-13 DIAGNOSIS — N6320 Unspecified lump in the left breast, unspecified quadrant: Secondary | ICD-10-CM

## 2021-05-13 NOTE — Progress Notes (Signed)
Subjective:    Patient ID: Kylie Davis, female    DOB: 03/21/1951, 70 y.o.   MRN: [id number]  HPI 70 year old female who  has a past medical history of Hypertension and Sleep apnea.  Here today for follow up regarding abnormal mammogram.  She had a screening mammogram done on 05/07/2021 which showed possible asymmetry of the left breast.  Earlier today she had diagnostic mammogram and ultrasound which showed   Targeted ultrasound is performed, showing a nearly anechoic mass with low level internal echoes at 7 o'clock, 3 cm from the nipple in the left breast, likely correlating with the mammographic finding. There appears to be a calcification within this mass, not dependently located. Given the low level echoes in the non dependent calcification, this could be a solid mass. No axillary adenopathy.  She has a biopsy this Friday     Review of Systems See HPI   Past Medical History:  Diagnosis Date   Hypertension    Sleep apnea    C PAP    Social History   Socioeconomic History   Marital status: Married    Spouse name: Not on file   Number of children: Not on file   Years of education: Not on file   Highest education level: Not on file  Occupational History   Occupation: retired Product manager: RETIRED  Tobacco Use   Smoking status: Never   Smokeless tobacco: Never  Substance and Sexual Activity   Alcohol use: Yes    Alcohol/week: 4.0 standard drinks    Types: 4 Standard drinks or equivalent per week    Comment: weekly   Drug use: No   Sexual activity: Yes    Birth control/protection: Post-menopausal    Comment: 1st intercourse 70 yo.---Fewer than 5 partners  Other Topics Concern   Not on file  Social History Narrative   Retired from being a Education officer, museum.    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 years    0 children    Social Determinants of Radio broadcast assistant Strain: Low Risk    Difficulty of Paying Living Expenses: Not hard at all  Food Insecurity: Not  on file  Transportation Needs: No Transportation Needs   Lack of Transportation (Medical): No   Lack of Transportation (Non-Medical): No  Physical Activity: Sufficiently Active   Days of Exercise per Week: 4 days   Minutes of Exercise per Session: 40 min  Stress: Not on file  Social Connections: Socially Integrated   Frequency of Communication with Friends and Family: Three times a week   Frequency of Social Gatherings with Friends and Family: Three times a week   Attends Religious Services: More than 4 times per year   Active Member of Clubs or Organizations: Yes   Attends Music therapist: More than 4 times per year   Marital Status: Married  Human resources officer Violence: Not At Risk   Fear of Current or Ex-Partner: No   Emotionally Abused: No   Physically Abused: No   Sexually Abused: No    Past Surgical History:  Procedure Laterality Date   CHOLECYSTECTOMY  2004    Family History  Problem Relation Age of Onset   Cancer Mother 88       primary peritoneal cancer   Alzheimer's disease Mother    Cancer Father 76       stomach or pancreatic   Heart disease Father    Hypertension Brother    Heart attack  Brother 61       MI x 2   Diabetes Paternal Grandmother    Breast cancer Paternal Aunt 25   Colon cancer Neg Hx     No Known Allergies  Current Outpatient Medications on File Prior to Visit  Medication Sig Dispense Refill   aspirin 81 MG tablet Take 81 mg by mouth daily.     calcium-vitamin D (OSCAL-500) 500-400 MG-UNIT tablet Take 1 tablet by mouth daily.     clotrimazole-betamethasone (LOTRISONE) cream Apply 1 application topically 2 (two) times daily. 30 g 0   Dulaglutide (TRULICITY) 9.89 QJ/1.9ER SOPN ADMINISTER 0.75 MG UNDER THE SKIN 1 TIME A WEEK 6 mL 1   losartan-hydrochlorothiazide (HYZAAR) 50-12.5 MG tablet TAKE 1 TABLET BY MOUTH DAILY 90 tablet 2   Multiple Vitamins-Minerals (MULTIVITAMIN PO) Take by mouth.     terbinafine (LAMISIL) 250 MG tablet  Take 1 tablet (250 mg total) by mouth daily. 90 tablet 0   [DISCONTINUED] losartan (COZAAR) 50 MG tablet TAKE 1 TABLET(50 MG) BY MOUTH DAILY 90 tablet 1   No current facility-administered medications on file prior to visit.    BP 140/86    Pulse 78    Temp 98 F (36.7 C) (Oral)    Ht 5' 3.75" (1.619 m)    Wt 228 lb (103.4 kg)    SpO2 95%    BMI 39.44 kg/m       Objective:   Physical Exam Vitals and nursing note reviewed.  Constitutional:      Appearance: Normal appearance.  Skin:    General: Skin is warm and dry.     Capillary Refill: Capillary refill takes less than 2 seconds.  Neurological:     General: No focal deficit present.     Mental Status: She is alert and oriented to person, place, and time.  Psychiatric:        Mood and Affect: Mood normal.        Behavior: Behavior normal.        Thought Content: Thought content normal.        Judgment: Judgment normal.      Assessment & Plan:  1. Abnormal mammogram of left breast - Will await biopsy findings.   [name], NP

## 2021-05-16 ENCOUNTER — Ambulatory Visit
Admission: RE | Admit: 2021-05-16 | Discharge: 2021-05-16 | Disposition: A | Discharge status: Home / Self Care | Payer: Medicare PPO | Source: Ambulatory Visit | Attending: Adult Health | Admitting: Adult Health

## 2021-05-16 DIAGNOSIS — N6320 Unspecified lump in the left breast, unspecified quadrant: Secondary | ICD-10-CM

## 2021-05-16 DIAGNOSIS — N62 Hypertrophy of breast: Secondary | ICD-10-CM | POA: Diagnosis not present

## 2021-05-16 DIAGNOSIS — N6324 Unspecified lump in the left breast, lower inner quadrant: Secondary | ICD-10-CM | POA: Diagnosis not present

## 2021-06-17 ENCOUNTER — Other Ambulatory Visit: Payer: Medicare PPO

## 2021-07-16 ENCOUNTER — Other Ambulatory Visit: Payer: Self-pay | Admitting: Adult Health

## 2021-07-16 DIAGNOSIS — R7301 Impaired fasting glucose: Secondary | ICD-10-CM

## 2021-08-05 DIAGNOSIS — D2321 Other benign neoplasm of skin of right ear and external auricular canal: Secondary | ICD-10-CM | POA: Diagnosis not present

## 2021-08-05 DIAGNOSIS — L814 Other melanin hyperpigmentation: Secondary | ICD-10-CM | POA: Diagnosis not present

## 2021-08-05 DIAGNOSIS — L821 Other seborrheic keratosis: Secondary | ICD-10-CM | POA: Diagnosis not present

## 2021-08-05 DIAGNOSIS — D2322 Other benign neoplasm of skin of left ear and external auricular canal: Secondary | ICD-10-CM | POA: Diagnosis not present

## 2021-09-15 ENCOUNTER — Ambulatory Visit: Payer: Medicare PPO | Admitting: Family Medicine

## 2021-09-15 ENCOUNTER — Encounter: Payer: Self-pay | Admitting: Family Medicine

## 2021-09-15 VITALS — BP 130/78 | HR 75 | Temp 98.7°F | Wt 234.0 lb

## 2021-09-15 DIAGNOSIS — C541 Malignant neoplasm of endometrium: Secondary | ICD-10-CM

## 2021-09-15 DIAGNOSIS — M545 Low back pain, unspecified: Secondary | ICD-10-CM | POA: Diagnosis not present

## 2021-09-15 DIAGNOSIS — N390 Urinary tract infection, site not specified: Secondary | ICD-10-CM

## 2021-09-15 DIAGNOSIS — E7439 Other disorders of intestinal carbohydrate absorption: Secondary | ICD-10-CM | POA: Diagnosis not present

## 2021-09-15 DIAGNOSIS — R319 Hematuria, unspecified: Secondary | ICD-10-CM

## 2021-09-15 DIAGNOSIS — C801 Malignant (primary) neoplasm, unspecified: Secondary | ICD-10-CM

## 2021-09-15 HISTORY — DX: Malignant (primary) neoplasm, unspecified: C80.1

## 2021-09-15 HISTORY — DX: Malignant neoplasm of endometrium: C54.1

## 2021-09-15 LAB — POC URINALSYSI DIPSTICK (AUTOMATED)
Bilirubin, UA: NEGATIVE
Glucose, UA: NEGATIVE
Ketones, UA: NEGATIVE
Leukocytes, UA: NEGATIVE
Nitrite, UA: NEGATIVE
Protein, UA: NEGATIVE
Spec Grav, UA: 1.025 (ref 1.010–1.025)
Urobilinogen, UA: 0.2 E.U./dL
pH, UA: 6 (ref 5.0–8.0)

## 2021-09-15 LAB — POCT GLYCOSYLATED HEMOGLOBIN (HGB A1C): Hemoglobin A1C: 5.9 % — AB (ref 4.0–5.6)

## 2021-09-15 MED ORDER — CIPROFLOXACIN HCL 500 MG PO TABS: 500.0000 mg | ORAL_TABLET | Freq: Two times a day (BID) | ORAL | 0 refills | Status: DC

## 2021-09-15 NOTE — Progress Notes (Signed)
? ?  Subjective:  ? ? Patient ID: Kylie Davis, female    DOB: [birth year], 71 y.o.   MRN: [id number] ? ?HPI ?Here for 2 days of urgency to urinate and traces of blood in the urine. No fever. She also has some mild ow back pain. Drinking lots of water.  ? ? ?Review of Systems  ?Constitutional: Negative.   ?Respiratory: Negative.    ?Cardiovascular: Negative.   ?Genitourinary:  Positive for dysuria, hematuria and urgency. Negative for flank pain.  ? ?   ?Objective:  ? Physical Exam ?Constitutional:   ?   Appearance: Normal appearance. She is not ill-appearing.  ?Cardiovascular:  ?   Rate and Rhythm: Normal rate and regular rhythm.  ?   Pulses: Normal pulses.  ?   Heart sounds: Normal heart sounds.  ?Pulmonary:  ?   Effort: Pulmonary effort is normal.  ?   Breath sounds: Normal breath sounds.  ?Abdominal:  ?   Tenderness: There is no right CVA tenderness or left CVA tenderness.  ?Neurological:  ?   Mental Status: She is alert.  ? ? ? ? ? ?   ?Assessment & Plan:  ?UTI, treat with Cipro. Culture the sample.  ?Alysia Penna, MD ? ? ?

## 2021-09-15 NOTE — Progress Notes (Signed)
? ?  Subjective:  ? ? Patient ID: Kylie Davis, female    DOB: 25-Dec-1950, 71 y.o.   MRN: [id number] ? ?HPI ? ? ? ?Review of Systems ? ?   ?Objective:  ? Physical Exam ? ? ? ? ?   ?Assessment & Plan:  ? ? ?

## 2021-09-16 LAB — URINE CULTURE
MICRO NUMBER:: 13334127
Result:: NO GROWTH
SPECIMEN QUALITY:: ADEQUATE

## 2021-09-18 ENCOUNTER — Other Ambulatory Visit: Payer: Self-pay

## 2021-09-18 ENCOUNTER — Ambulatory Visit: Payer: Medicare PPO | Admitting: Family Medicine

## 2021-09-18 ENCOUNTER — Encounter: Payer: Self-pay | Admitting: Family Medicine

## 2021-09-18 VITALS — BP 130/78 | HR 67 | Temp 98.2°F | Wt 233.0 lb

## 2021-09-18 DIAGNOSIS — R1030 Lower abdominal pain, unspecified: Secondary | ICD-10-CM | POA: Diagnosis not present

## 2021-09-18 DIAGNOSIS — N950 Postmenopausal bleeding: Secondary | ICD-10-CM

## 2021-09-18 LAB — CBC WITH DIFFERENTIAL/PLATELET
Basophils Absolute: 0 10*3/uL (ref 0.0–0.1)
Basophils Relative: 0.7 % (ref 0.0–3.0)
Eosinophils Absolute: 0.2 10*3/uL (ref 0.0–0.7)
Eosinophils Relative: 3.2 % (ref 0.0–5.0)
HCT: 43.2 % (ref 36.0–46.0)
Hemoglobin: 14.2 g/dL (ref 12.0–15.0)
Lymphocytes Relative: 32.8 % (ref 12.0–46.0)
Lymphs Abs: 1.7 10*3/uL (ref 0.7–4.0)
MCHC: 32.9 g/dL (ref 30.0–36.0)
MCV: 89.1 fl (ref 78.0–100.0)
Monocytes Absolute: 0.4 10*3/uL (ref 0.1–1.0)
Monocytes Relative: 8.3 % (ref 3.0–12.0)
Neutro Abs: 2.9 10*3/uL (ref 1.4–7.7)
Neutrophils Relative %: 55 % (ref 43.0–77.0)
Platelets: 289 10*3/uL (ref 150.0–400.0)
RBC: 4.85 Mil/uL (ref 3.87–5.11)
RDW: 14.2 % (ref 11.5–15.5)
WBC: 5.3 10*3/uL (ref 4.0–10.5)

## 2021-09-18 LAB — POC URINALSYSI DIPSTICK (AUTOMATED)
Bilirubin, UA: NEGATIVE
Glucose, UA: NEGATIVE
Ketones, UA: NEGATIVE
Leukocytes, UA: NEGATIVE
Nitrite, UA: NEGATIVE
Protein, UA: POSITIVE — AB
Spec Grav, UA: 1.01 (ref 1.010–1.025)
Urobilinogen, UA: 0.2 E.U./dL
pH, UA: 6.5 (ref 5.0–8.0)

## 2021-09-18 LAB — BASIC METABOLIC PANEL
BUN: 12 mg/dL (ref 6–23)
CO2: 30 mEq/L (ref 19–32)
Calcium: 9.6 mg/dL (ref 8.4–10.5)
Chloride: 101 mEq/L (ref 96–112)
Creatinine, Ser: 0.84 mg/dL (ref 0.40–1.20)
GFR: 69.63 mL/min (ref >60.00)
Glucose, Bld: 118 mg/dL — ABNORMAL HIGH (ref 70–99)
Potassium: 3.9 mEq/L (ref 3.5–5.1)
Sodium: 139 mEq/L (ref 135–145)

## 2021-09-18 NOTE — Progress Notes (Signed)
? ?  Subjective:  ? ? Patient ID: Kylie Davis, female    DOB: 1951/01/25, 71 y.o.   MRN: [id number] ? ?HPI ?Here to follow up on lower abdominal pain, low back  pain, and blood in the urine. She was seen here on 09-15-21 for this, and she was placed on Cipro. A culture of her urine that day showed no growth of bacteria. Her symptoms have not changed since her last visit, although she now describes some spontaneous bleeding into her panties that is not connected with urinating. She mentions that her mother had ovarian cancer. There has been no fever, no nausea or vomiting, and her BM's have been regular.  ? ? ?Review of Systems  ?Constitutional: Negative.   ?Respiratory: Negative.    ?Cardiovascular: Negative.   ?Gastrointestinal:  Positive for abdominal pain. Negative for abdominal distention, anal bleeding, blood in stool, constipation, diarrhea, nausea, rectal pain and vomiting.  ?Genitourinary:  Positive for dysuria, hematuria and vaginal bleeding. Negative for difficulty urinating, flank pain, urgency and vaginal discharge.  ?Musculoskeletal:  Positive for back pain.  ? ?   ?Objective:  ? Physical Exam ?Constitutional:   ?   Appearance: Normal appearance. She is not ill-appearing.  ?Cardiovascular:  ?   Rate and Rhythm: Normal rate and regular rhythm.  ?   Pulses: Normal pulses.  ?   Heart sounds: Normal heart sounds.  ?Pulmonary:  ?   Effort: Pulmonary effort is normal.  ?   Breath sounds: Normal breath sounds.  ?Abdominal:  ?   General: Abdomen is flat. Bowel sounds are normal. There is no distension.  ?   Palpations: Abdomen is soft. There is no mass.  ?   Tenderness: There is no right CVA tenderness, left CVA tenderness, guarding or rebound.  ?   Hernia: No hernia is present.  ?   Comments: Moderately tender in the LLQ but not above the pubis   ?Genitourinary: ?   General: Normal vulva.  ?   Rectum: Normal.  ?   Comments: There is blood present in the vaginal vault. The cervix appears normal. On bimanual  exam there is no tenderness and no masses are felt. I am not able to palpate the ovaries.  ?Neurological:  ?   Mental Status: She is alert.  ? ? ? ? ? ?   ?Assessment & Plan:  ?Post-menopausal bleeding with abdominal and back pain. We will set up a contrasted CT of the abdomen and pelvis. Refer urgently to GYN.  ?Alysia Penna, MD ? ? ?

## 2021-09-18 NOTE — Addendum Note (Signed)
Addended by: Wyvonne Lenz on: 09/18/2021 11:45 AM ? ? Modules accepted: Orders ? ?

## 2021-09-24 DIAGNOSIS — Z872 Personal history of diseases of the skin and subcutaneous tissue: Secondary | ICD-10-CM | POA: Diagnosis not present

## 2021-09-24 DIAGNOSIS — L821 Other seborrheic keratosis: Secondary | ICD-10-CM | POA: Diagnosis not present

## 2021-09-24 DIAGNOSIS — D225 Melanocytic nevi of trunk: Secondary | ICD-10-CM | POA: Diagnosis not present

## 2021-09-24 DIAGNOSIS — L814 Other melanin hyperpigmentation: Secondary | ICD-10-CM | POA: Diagnosis not present

## 2021-09-25 ENCOUNTER — Encounter: Payer: Self-pay | Admitting: Obstetrics and Gynecology

## 2021-09-25 ENCOUNTER — Other Ambulatory Visit: Payer: Self-pay

## 2021-09-25 ENCOUNTER — Other Ambulatory Visit (HOSPITAL_COMMUNITY)
Admission: RE | Admit: 2021-09-25 | Discharge: 2021-09-25 | Disposition: A | Discharge status: Home / Self Care | Payer: Medicare PPO | Source: Ambulatory Visit | Attending: Obstetrics and Gynecology | Admitting: Obstetrics and Gynecology

## 2021-09-25 ENCOUNTER — Ambulatory Visit: Payer: Medicare PPO | Admitting: Obstetrics and Gynecology

## 2021-09-25 VITALS — BP 132/86 | HR 76 | Ht 64.0 in | Wt 233.0 lb

## 2021-09-25 DIAGNOSIS — N950 Postmenopausal bleeding: Secondary | ICD-10-CM | POA: Diagnosis not present

## 2021-09-25 DIAGNOSIS — C541 Malignant neoplasm of endometrium: Secondary | ICD-10-CM | POA: Insufficient documentation

## 2021-09-25 DIAGNOSIS — Z1151 Encounter for screening for human papillomavirus (HPV): Secondary | ICD-10-CM | POA: Diagnosis not present

## 2021-09-25 DIAGNOSIS — Z01419 Encounter for gynecological examination (general) (routine) without abnormal findings: Secondary | ICD-10-CM

## 2021-09-25 DIAGNOSIS — Z124 Encounter for screening for malignant neoplasm of cervix: Secondary | ICD-10-CM | POA: Insufficient documentation

## 2021-09-25 NOTE — Patient Instructions (Signed)

## 2021-09-25 NOTE — Progress Notes (Signed)
71 y.o. G0P0 Married White or Caucasian Not Hispanic or Latino female here for evaluation of PMP bleeding Patient states she is not having light bleeding for about 2 weeks.  She has also had some mild abdominal discomfort during this time. No bowel or bladder c/o.  ? ?Sexually active, not for months. No h/o dyspareunia. ? ?Would also like a breast exam due to history. She had a breast biopsy in 12/22. ? ?Dad had pancreatic cancer, 69's ? ?Mom had peritoneal cancer, 106 ?  ? ?No LMP recorded. Patient is postmenopausal.          ?Sexually active: Yes.    ?The current method of family planning is post menopausal status.    ?Exercising: Yes.     Gym class  ?Smoker:  no ? ?Health Maintenance: ?Pap:  05/04/12 WNL  ?History of abnormal Pap:  no ?MMG:  04/2021 see epic  ?BMD:   04/21/16  normal  ?Colonoscopy: 02/24/13 F/U 10 years  ?TDaP:  09/13/13  ?Gardasil: n/a ? ? reports that she has never smoked. She has never used smokeless tobacco. She reports current alcohol use of about 4.0 standard drinks per week. She reports that she does not use drugs. Retired.  ? ?Past Medical History:  ?Diagnosis Date  ? Hypertension   ? Sleep apnea   ? C PAP  ? ? ?Past Surgical History:  ?Procedure Laterality Date  ? CHOLECYSTECTOMY  2004  ? ? ?Current Outpatient Medications  ?Medication Sig Dispense Refill  ? aspirin 81 MG tablet Take 81 mg by mouth daily.    ? calcium-vitamin D (OSCAL-500) 500-400 MG-UNIT tablet Take 1 tablet by mouth daily.    ? ciprofloxacin (CIPRO) 500 MG tablet Take 1 tablet (500 mg total) by mouth 2 (two) times daily. 14 tablet 0  ? losartan-hydrochlorothiazide (HYZAAR) 50-12.5 MG tablet TAKE 1 TABLET BY MOUTH DAILY 90 tablet 2  ? Multiple Vitamins-Minerals (MULTIVITAMIN PO) Take by mouth.    ? TRULICITY 4.48 JE/5.6DJ SOPN ADMINISTER 0.75 MG UNDER THE SKIN 1 TIME A WEEK 6 mL 1  ? ?No current facility-administered medications for this visit.  ? ? ?Family History  ?Problem Relation Age of Onset  ? Cancer Mother 64  ?      primary peritoneal cancer  ? Alzheimer's disease Mother   ? Cancer Father 75  ?     stomach or pancreatic  ? Heart disease Father   ? Hypertension Brother   ? Heart attack Brother 66  ?     MI x 2  ? Diabetes Paternal Grandmother   ? Breast cancer Paternal Aunt 38  ? Colon cancer Neg Hx   ? ? ?Review of Systems  ?Genitourinary:  Positive for vaginal bleeding.  ?All other systems reviewed and are negative. ? ?Exam:   ?There were no vitals taken for this visit.  Weight change: '@WEIGHTCHANGE'$ @ Height:      ?Ht Readings from Last 3 Encounters:  ?05/13/21 5' 3.75" (1.619 m)  ?04/08/21 5' 3.75" (1.619 m)  ?03/25/21 5' 3.75" (1.619 m)  ? ? ?General appearance: alert, cooperative and appears stated age ?Head: Normocephalic, without obvious abnormality, atraumatic ?Neck: no adenopathy, supple, symmetrical, trachea midline and thyroid normal to inspection and palpation ?Lungs: clear to auscultation bilaterally ?Cardiovascular: regular rate and rhythm ?Breasts: normal appearance, no masses or tenderness ?Abdomen: soft, non-tender; non distended,  no masses,  no organomegaly ?Extremities: extremities normal, atraumatic, no cyanosis or edema ?Skin: Skin color, texture, turgor normal. No rashes or lesions ?Lymph  nodes: Cervical, supraclavicular, and axillary nodes normal. ?No abnormal inguinal nodes palpated ?Neurologic: Grossly normal ? ? ?Pelvic: External genitalia:  no lesions ?             Urethra:  normal appearing urethra with no masses, tenderness or lesions ?             Bartholins and Skenes: normal    ?             Vagina: normal appearing vagina with normal color and discharge, no lesions ?             Cervix: no lesions ?              ?Bimanual Exam:  Uterus:   anteverted, mobile, not appreciably enlarged ?             Adnexa: no mass, fullness, tenderness ?              Rectovaginal: Confirms ?              Anus:  normal sphincter tone, no lesions ? ?The risks of endometrial biopsy were reviewed and a consent was  obtained.  ?A speculum was placed in the vagina and the cervix was cleansed with betadine. A tenaculum was placed on the cervix and the pipelle was placed into the endometrial cavity. The uterus sounded to ~7 cm. The endometrial biopsy was performed, taking care to get a representative sample, sampling 360 degrees of the uterine cavity. Moderate blood/tissue was obtained. The tenaculum and speculum were removed. There were no complications.  ? ? ?Gae Dry chaperoned for the exam. ? ?1. Postmenopausal bleeding ?- Surgical pathology( [place]/ [facility]) ?-Depending on biopsy results she may need an ultrasound ? ?2. Encounter for breast and pelvic examination ?F/U mammogram next month ? ?3. Screening for cervical cancer ?- Cytology - PAP ? ?

## 2021-09-29 ENCOUNTER — Telehealth: Payer: Self-pay

## 2021-09-29 DIAGNOSIS — C541 Malignant neoplasm of endometrium: Secondary | ICD-10-CM

## 2021-09-29 LAB — CYTOLOGY - PAP
Comment: NEGATIVE
Diagnosis: UNDETERMINED — AB
High risk HPV: NEGATIVE

## 2021-09-29 NOTE — Telephone Encounter (Signed)
Kylie Cobbs, MD  [facility] Triage ?Please make referral to Dr. Valarie Cones, GYN ONC.  ? ?Diagnosis of endometrial cancer.  ?

## 2021-09-29 NOTE — Telephone Encounter (Signed)
Referral order placed in Epic. 

## 2021-09-30 LAB — SURGICAL PATHOLOGY

## 2021-10-02 ENCOUNTER — Telehealth: Payer: Self-pay | Admitting: *Deleted

## 2021-10-02 NOTE — Telephone Encounter (Signed)
Spoke with the patient and scheduled a new patient with Dr Berline Lopes on *6/2 at 12 pm. Patient given an arrival time of 11:30 am. Patient also given the address and phone number for the clinic; along with the policy for mask and visitors. Advised the patient that the office would call her if an appt comes available sooner

## 2021-10-02 NOTE — Telephone Encounter (Signed)
Appt scheduled 10/17/21 at noon with Dr. Berline Lopes.

## 2021-10-03 ENCOUNTER — Telehealth: Payer: Self-pay | Admitting: *Deleted

## 2021-10-03 ENCOUNTER — Encounter: Payer: Self-pay | Admitting: Gynecologic Oncology

## 2021-10-03 NOTE — Telephone Encounter (Signed)
Called and moved the patient's new patient appt from 6/2 to 5/22

## 2021-10-05 NOTE — Progress Notes (Signed)
GYNECOLOGIC ONCOLOGY NEW PATIENT CONSULTATION   Patient Name: Kylie Davis  Patient Age: 71 y.o. 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  We reviewed the nature of endometrial cancer and its recommended surgical staging, including total hysterectomy, bilateral salpingo-oophorectomy, and lymph node assessment. The patient is a suitable candidate for staging via a minimally invasive approach to surgery.  We reviewed that robotic assistance would be used to complete the surgery.   We discussed that most endometrial cancer is detected early and that decisions regarding adjuvant therapy will be made based on her final pathology.   We reviewed the sentinel lymph node technique. Risks and benefits of sentinel lymph node biopsy was reviewed. We reviewed the technique and ICG dye. The patient DOES NOT have an iodine allergy or known liver dysfunction. We reviewed the false negative rate (0.4%), and that 3% of patients with metastatic disease will not have it detected by SLN biopsy in endometrial cancer. A low risk of allergic reaction to the dye, <0.2% for ICG, has been reported. We also discussed that in the case of failed mapping, which occurs 40% of the time, a bilateral or unilateral lymphadenectomy will be performed at the surgeon's discretion.   Potential benefits of sentinel nodes including a higher detection rate for metastasis due to ultrastaging and potential reduction in operative morbidity. However, there remains uncertainty as to the role for treatment of micrometastatic disease. Further, the benefit of operative morbidity associated with the SLN technique in endometrial cancer is not yet completely known. In other patient populations (e.g. the cervical cancer population) there has  been observed reductions in morbidity with SLN biopsy compared to pelvic lymphadenectomy. Lymphedema, nerve dysfunction and lymphocysts are all potential risks with the SLN technique as with complete lymphadenectomy. Additional risks to the patient include the risk of damage to an internal organ while operating in an altered view (e.g. the black and white image of the robotic fluorescence imaging mode).   We discussed plan for a robotic assisted hysterectomy, bilateral salpingo-oophorectomy, sentinel lymph node evaluation, possible lymph node dissection, possible laparotomy. The risks of surgery were discussed in detail and she understands these to include infection; wound separation; hernia; vaginal cuff separation, injury to adjacent organs such as bowel, bladder, blood vessels, ureters and nerves; bleeding which may require blood transfusion; anesthesia risk; thromboembolic events; possible death; unforeseen complications; possible need for re-exploration; medical complications such as heart attack, stroke, pleural effusion and pneumonia; and, if full lymphadenectomy is performed the risk of lymphedema and lymphocyst. The patient will receive DVT and antibiotic prophylaxis as indicated. She voiced a clear understanding. She had the opportunity to ask questions. Perioperative instructions were reviewed with her. Prescriptions for post-op medications were sent to her pharmacy of choice.  Discussed importance of bringing CPAP machine on the day of surgery.  Given family history, recommended consultation with our genetic counselors.  It sounds like she has had some genetic testing (likely for BRCA 1 and 2) number of years ago that was negative.  Referral placed today.  A copy of this note was sent to the patient's referring provider.   60 minutes of total time was spent for this patient encounter, including preparation, face-to-face counseling with the patient and coordination of care, and documentation of  the encounter.   Jeral Pinch, MD  Division of Gynecologic  Oncology  Department of Obstetrics and Gynecology  [facility]  ___________________________________________  Chief Complaint: Chief Complaint  Patient presents with   Endometrial cancer [facility])    History of Present Illness:  Kylie Davis is a 71 y.o. y.o. female who is seen in consultation at the request of Dr. Quincy Simmonds for an evaluation of endometrial cancer.  Patient reports postmenopausal bleeding starting on 4/29.  She called to see her doctor the following Monday.  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 MMR IHC is intact.  Patient notes she has some bleeding when she wiped initially.  She has had very little spotting on her underwear.  Had some increased bleeding with her biopsy, otherwise denies any heavier bleeding.  Denies any pain or cramping.  She reports regular bowel function.  Endorses a good appetite.  She endorses some mild urinary incontinence, stress.  Family history is notable for father with either pancreatic or stomach cancer in his 75s and mother with peritoneal cancer in her 17s.  Patient is unsure whether her mother had genetic testing.  She herself had testing that it sounds like was for BRCA 1 and 2, at least 5 years ago.  This testing was negative.    PAST MEDICAL HISTORY:  Past Medical History:  Diagnosis Date   BMI 39.0-39.9,adult    Diabetes ([place])    on Trulicity   Hypertension    Sleep apnea    C PAP     PAST SURGICAL HISTORY:  Past Surgical History:  Procedure Laterality Date   CHOLECYSTECTOMY  2004    OB/GYN HISTORY:  OB History  Gravida Para Term Preterm AB Living  0 0 0 0 0 0  SAB IAB Ectopic Multiple Live Births  0 0 0 0 0    No LMP recorded. Patient is postmenopausal.  Age at menarche: 89 Age at menopause: In her 41s Hx of HRT: Denies Hx of STDs: Denies Last pap: 04/2012  - negative; 09/2021 - ASCUS, HPV negative History of abnormal pap smears: no (other than recent ASCUS pap) Previously used OCPs and IUD for birth control  SCREENING STUDIES:  Last mammogram: 04/2021; scheduled in 10/2021  Last colonoscopy: 2014 Last bone mineral density: 2017  MEDICATIONS: Outpatient Encounter Medications as of 10/06/2021  Medication Sig   aspirin 81 MG tablet Take 81 mg by mouth daily.   calcium-vitamin D (OSCAL-500) 500-400 MG-UNIT tablet Take 1 tablet by mouth daily.   losartan-hydrochlorothiazide (HYZAAR) 50-12.5 MG tablet TAKE 1 TABLET BY MOUTH DAILY   Multiple Vitamins-Minerals (MULTIVITAMIN PO) Take by mouth.   TRULICITY 3.55 HR/4.1UL SOPN ADMINISTER 0.75 MG UNDER THE SKIN 1 TIME A WEEK   No facility-administered encounter medications on file as of 10/06/2021.    ALLERGIES:  No Known Allergies   FAMILY HISTORY:  Family History  Problem Relation Age of Onset   Cancer Mother 43       primary peritoneal cancer   Alzheimer's disease Mother    Cancer Father 14       stomach or pancreatic   Heart disease Father    Hypertension Brother    Heart attack Brother 74       MI x 2   Breast cancer Paternal Aunt 69   Diabetes Paternal Grandmother    Colon cancer Neg Hx    Prostate cancer Neg Hx    Ovarian cancer Neg Hx      SOCIAL HISTORY:  Social  Connections: Socially Integrated   Frequency of Communication with Friends and Family: Three times a week   Frequency of Social Gatherings with Friends and Family: Three times a week   Attends Religious Services: More than 4 times per year   Active Member of Clubs or Organizations: Yes   Attends Music therapist: More than 4 times per year   Marital Status: Married    REVIEW OF SYSTEMS:  Denies appetite changes, fevers, chills, fatigue, unexplained weight changes. Denies hearing loss, neck lumps or masses, mouth sores, ringing in ears or voice changes. Denies cough or wheezing.  Denies shortness of  breath. Denies chest pain or palpitations. Denies leg swelling. Denies abdominal distention, pain, blood in stools, constipation, diarrhea, nausea, vomiting, or early satiety. Denies pain with intercourse, dysuria, frequency, hematuria or incontinence. Denies hot flashes, pelvic pain, vaginal discharge.   Denies joint pain, back pain or muscle pain/cramps. Denies itching, rash, or wounds. Denies dizziness, headaches, numbness or seizures. Denies swollen lymph nodes or glands, denies easy bruising or bleeding. Denies anxiety, depression, confusion, or decreased concentration.  Physical Exam:  Vital Signs for this encounter:  Blood pressure 135/60, pulse 78, temperature 98.7 F (37.1 C), temperature source Oral, resp. rate 18, height 5' 4" (1.626 m), weight 230 lb 1.6 oz (104.4 kg), SpO2 100 %. Body mass index is 39.5 kg/m. General: Alert, oriented, no acute distress.  HEENT: Normocephalic, atraumatic. Sclera anicteric.  Chest: Clear to auscultation bilaterally. No wheezes, rhonchi, or rales. Cardiovascular: Regular rate and rhythm, no murmurs, rubs, or gallops.  Abdomen: Obese. Normoactive bowel sounds. Soft, nondistended, nontender to palpation. No masses or hepatosplenomegaly appreciated. No palpable fluid wave.  Extremities: Grossly normal range of motion. Warm, well perfused. No edema bilaterally.  Skin: No rashes or lesions.  Lymphatics: No cervical, supraclavicular, or inguinal adenopathy.  GU:  Normal external female genitalia. No lesions. No discharge or bleeding.             Bladder/urethra:  No lesions or masses, well supported bladder             Vagina: Narrow vagina, no lesions noted.             Cervix: Normal appearing, no lesions.  Atrophic.  Posterior facing.             Uterus: Somewhat limited by body habitus and narrow vagina.  Small, mobile, no parametrial involvement or nodularity.             Adnexa: No masses appreciated.  Rectal: Deferred.  LABORATORY AND  RADIOLOGIC DATA:  Outside medical records were reviewed to synthesize the above history, along with the history and physical obtained during the visit.   Lab Results  Component Value Date   WBC 5.3 09/18/2021   HGB 14.2 09/18/2021   HCT 43.2 09/18/2021   PLT 289.0 09/18/2021   GLUCOSE 118 (H) 09/18/2021   CHOL 171 02/04/2021   TRIG 115.0 02/04/2021   HDL 48.50 02/04/2021   LDLDIRECT 132.0 01/31/2007   LDLCALC 99 02/04/2021   ALT 27 02/04/2021   AST 21 02/04/2021   NA 139 09/18/2021   K 3.9 09/18/2021   CL 101 09/18/2021   CREATININE 0.84 09/18/2021   BUN 12 09/18/2021   CO2 30 09/18/2021   TSH 4.03 02/04/2021   HGBA1C 5.9 (A) 09/15/2021

## 2021-10-05 NOTE — H&P (View-Only) (Signed)
GYNECOLOGIC ONCOLOGY NEW PATIENT CONSULTATION   Patient Name: Kylie Davis  Patient Age: 71 y.o. 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  We reviewed the nature of endometrial cancer and its recommended surgical staging, including total hysterectomy, bilateral salpingo-oophorectomy, and lymph node assessment. The patient is a suitable candidate for staging via a minimally invasive approach to surgery.  We reviewed that robotic assistance would be used to complete the surgery.   We discussed that most endometrial cancer is detected early and that decisions regarding adjuvant therapy will be made based on her final pathology.   We reviewed the sentinel lymph node technique. Risks and benefits of sentinel lymph node biopsy was reviewed. We reviewed the technique and ICG dye. The patient DOES NOT have an iodine allergy or known liver dysfunction. We reviewed the false negative rate (0.4%), and that 3% of patients with metastatic disease will not have it detected by SLN biopsy in endometrial cancer. A low risk of allergic reaction to the dye, <0.2% for ICG, has been reported. We also discussed that in the case of failed mapping, which occurs 40% of the time, a bilateral or unilateral lymphadenectomy will be performed at the surgeon's discretion.   Potential benefits of sentinel nodes including a higher detection rate for metastasis due to ultrastaging and potential reduction in operative morbidity. However, there remains uncertainty as to the role for treatment of micrometastatic disease. Further, the benefit of operative morbidity associated with the SLN technique in endometrial cancer is not yet completely known. In other patient populations (e.g. the cervical cancer population) there has  been observed reductions in morbidity with SLN biopsy compared to pelvic lymphadenectomy. Lymphedema, nerve dysfunction and lymphocysts are all potential risks with the SLN technique as with complete lymphadenectomy. Additional risks to the patient include the risk of damage to an internal organ while operating in an altered view (e.g. the black and white image of the robotic fluorescence imaging mode).   We discussed plan for a robotic assisted hysterectomy, bilateral salpingo-oophorectomy, sentinel lymph node evaluation, possible lymph node dissection, possible laparotomy. The risks of surgery were discussed in detail and she understands these to include infection; wound separation; hernia; vaginal cuff separation, injury to adjacent organs such as bowel, bladder, blood vessels, ureters and nerves; bleeding which may require blood transfusion; anesthesia risk; thromboembolic events; possible death; unforeseen complications; possible need for re-exploration; medical complications such as heart attack, stroke, pleural effusion and pneumonia; and, if full lymphadenectomy is performed the risk of lymphedema and lymphocyst. The patient will receive DVT and antibiotic prophylaxis as indicated. She voiced a clear understanding. She had the opportunity to ask questions. Perioperative instructions were reviewed with her. Prescriptions for post-op medications were sent to her pharmacy of choice.  Discussed importance of bringing CPAP machine on the day of surgery.  Given family history, recommended consultation with our genetic counselors.  It sounds like she has had some genetic testing (likely for BRCA 1 and 2) number of years ago that was negative.  Referral placed today.  A copy of this note was sent to the patient's referring provider.   60 minutes of total time was spent for this patient encounter, including preparation, face-to-face counseling with the patient and coordination of care, and documentation of  the encounter.   Jeral Pinch, MD  Division of Gynecologic  Oncology  Department of Obstetrics and Gynecology  [facility]  ___________________________________________  Chief Complaint: Chief Complaint  Patient presents with   Endometrial cancer [facility])    History of Present Illness:  Kylie Davis is a 71 y.o. y.o. female who is seen in consultation at the request of Dr. Quincy Simmonds for an evaluation of endometrial cancer.  Patient reports postmenopausal bleeding starting on 4/29.  She called to see her doctor the following Monday.  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 MMR IHC is intact.  Patient notes she has some bleeding when she wiped initially.  She has had very little spotting on her underwear.  Had some increased bleeding with her biopsy, otherwise denies any heavier bleeding.  Denies any pain or cramping.  She reports regular bowel function.  Endorses a good appetite.  She endorses some mild urinary incontinence, stress.  Family history is notable for father with either pancreatic or stomach cancer in his 86s and mother with peritoneal cancer in her 48s.  Patient is unsure whether her mother had genetic testing.  She herself had testing that it sounds like was for BRCA 1 and 2, at least 5 years ago.  This testing was negative.    PAST MEDICAL HISTORY:  Past Medical History:  Diagnosis Date   BMI 39.0-39.9,adult    Diabetes ([place])    on Trulicity   Hypertension    Sleep apnea    C PAP     PAST SURGICAL HISTORY:  Past Surgical History:  Procedure Laterality Date   CHOLECYSTECTOMY  2004    OB/GYN HISTORY:  OB History  Gravida Para Term Preterm AB Living  0 0 0 0 0 0  SAB IAB Ectopic Multiple Live Births  0 0 0 0 0    No LMP recorded. Patient is postmenopausal.  Age at menarche: 36 Age at menopause: In her 72s Hx of HRT: Denies Hx of STDs: Denies Last pap: 04/2012  - negative; 09/2021 - ASCUS, HPV negative History of abnormal pap smears: no (other than recent ASCUS pap) Previously used OCPs and IUD for birth control  SCREENING STUDIES:  Last mammogram: 04/2021; scheduled in 10/2021  Last colonoscopy: 2014 Last bone mineral density: 2017  MEDICATIONS: Outpatient Encounter Medications as of 10/06/2021  Medication Sig   aspirin 81 MG tablet Take 81 mg by mouth daily.   calcium-vitamin D (OSCAL-500) 500-400 MG-UNIT tablet Take 1 tablet by mouth daily.   losartan-hydrochlorothiazide (HYZAAR) 50-12.5 MG tablet TAKE 1 TABLET BY MOUTH DAILY   Multiple Vitamins-Minerals (MULTIVITAMIN PO) Take by mouth.   TRULICITY 3.55 HR/4.1UL SOPN ADMINISTER 0.75 MG UNDER THE SKIN 1 TIME A WEEK   No facility-administered encounter medications on file as of 10/06/2021.    ALLERGIES:  No Known Allergies   FAMILY HISTORY:  Family History  Problem Relation Age of Onset   Cancer Mother 38       primary peritoneal cancer   Alzheimer's disease Mother    Cancer Father 64       stomach or pancreatic   Heart disease Father    Hypertension Brother    Heart attack Brother 78       MI x 2   Breast cancer Paternal Aunt 22   Diabetes Paternal Grandmother    Colon cancer Neg Hx    Prostate cancer Neg Hx    Ovarian cancer Neg Hx      SOCIAL HISTORY:  Social  Connections: Socially Integrated   Frequency of Communication with Friends and Family: Three times a week   Frequency of Social Gatherings with Friends and Family: Three times a week   Attends Religious Services: More than 4 times per year   Active Member of Clubs or Organizations: Yes   Attends Music therapist: More than 4 times per year   Marital Status: Married    REVIEW OF SYSTEMS:  Denies appetite changes, fevers, chills, fatigue, unexplained weight changes. Denies hearing loss, neck lumps or masses, mouth sores, ringing in ears or voice changes. Denies cough or wheezing.  Denies shortness of  breath. Denies chest pain or palpitations. Denies leg swelling. Denies abdominal distention, pain, blood in stools, constipation, diarrhea, nausea, vomiting, or early satiety. Denies pain with intercourse, dysuria, frequency, hematuria or incontinence. Denies hot flashes, pelvic pain, vaginal discharge.   Denies joint pain, back pain or muscle pain/cramps. Denies itching, rash, or wounds. Denies dizziness, headaches, numbness or seizures. Denies swollen lymph nodes or glands, denies easy bruising or bleeding. Denies anxiety, depression, confusion, or decreased concentration.  Physical Exam:  Vital Signs for this encounter:  Blood pressure 135/60, pulse 78, temperature 98.7 F (37.1 C), temperature source Oral, resp. rate 18, height 5' 4" (1.626 m), weight 230 lb 1.6 oz (104.4 kg), SpO2 100 %. Body mass index is 39.5 kg/m. General: Alert, oriented, no acute distress.  HEENT: Normocephalic, atraumatic. Sclera anicteric.  Chest: Clear to auscultation bilaterally. No wheezes, rhonchi, or rales. Cardiovascular: Regular rate and rhythm, no murmurs, rubs, or gallops.  Abdomen: Obese. Normoactive bowel sounds. Soft, nondistended, nontender to palpation. No masses or hepatosplenomegaly appreciated. No palpable fluid wave.  Extremities: Grossly normal range of motion. Warm, well perfused. No edema bilaterally.  Skin: No rashes or lesions.  Lymphatics: No cervical, supraclavicular, or inguinal adenopathy.  GU:  Normal external female genitalia. No lesions. No discharge or bleeding.             Bladder/urethra:  No lesions or masses, well supported bladder             Vagina: Narrow vagina, no lesions noted.             Cervix: Normal appearing, no lesions.  Atrophic.  Posterior facing.             Uterus: Somewhat limited by body habitus and narrow vagina.  Small, mobile, no parametrial involvement or nodularity.             Adnexa: No masses appreciated.  Rectal: Deferred.  LABORATORY AND  RADIOLOGIC DATA:  Outside medical records were reviewed to synthesize the above history, along with the history and physical obtained during the visit.   Lab Results  Component Value Date   WBC 5.3 09/18/2021   HGB 14.2 09/18/2021   HCT 43.2 09/18/2021   PLT 289.0 09/18/2021   GLUCOSE 118 (H) 09/18/2021   CHOL 171 02/04/2021   TRIG 115.0 02/04/2021   HDL 48.50 02/04/2021   LDLDIRECT 132.0 01/31/2007   LDLCALC 99 02/04/2021   ALT 27 02/04/2021   AST 21 02/04/2021   NA 139 09/18/2021   K 3.9 09/18/2021   CL 101 09/18/2021   CREATININE 0.84 09/18/2021   BUN 12 09/18/2021   CO2 30 09/18/2021   TSH 4.03 02/04/2021   HGBA1C 5.9 (A) 09/15/2021

## 2021-10-06 ENCOUNTER — Inpatient Hospital Stay: Payer: Medicare PPO | Attending: Gynecologic Oncology | Admitting: Gynecologic Oncology

## 2021-10-06 ENCOUNTER — Other Ambulatory Visit: Payer: Self-pay

## 2021-10-06 ENCOUNTER — Inpatient Hospital Stay (HOSPITAL_BASED_OUTPATIENT_CLINIC_OR_DEPARTMENT_OTHER): Payer: Medicare PPO | Admitting: Gynecologic Oncology

## 2021-10-06 ENCOUNTER — Encounter: Payer: Self-pay | Admitting: Gynecologic Oncology

## 2021-10-06 VITALS — BP 135/60 | HR 78 | Temp 98.7°F | Resp 18 | Ht 64.0 in | Wt 230.1 lb

## 2021-10-06 DIAGNOSIS — I10 Essential (primary) hypertension: Secondary | ICD-10-CM | POA: Insufficient documentation

## 2021-10-06 DIAGNOSIS — Z7982 Long term (current) use of aspirin: Secondary | ICD-10-CM | POA: Insufficient documentation

## 2021-10-06 DIAGNOSIS — Z79899 Other long term (current) drug therapy: Secondary | ICD-10-CM | POA: Diagnosis not present

## 2021-10-06 DIAGNOSIS — Z808 Family history of malignant neoplasm of other organs or systems: Secondary | ICD-10-CM

## 2021-10-06 DIAGNOSIS — E669 Obesity, unspecified: Secondary | ICD-10-CM

## 2021-10-06 DIAGNOSIS — C541 Malignant neoplasm of endometrium: Secondary | ICD-10-CM | POA: Diagnosis not present

## 2021-10-06 DIAGNOSIS — G4730 Sleep apnea, unspecified: Secondary | ICD-10-CM | POA: Insufficient documentation

## 2021-10-06 DIAGNOSIS — E119 Type 2 diabetes mellitus without complications: Secondary | ICD-10-CM | POA: Diagnosis not present

## 2021-10-06 DIAGNOSIS — N393 Stress incontinence (female) (male): Secondary | ICD-10-CM | POA: Insufficient documentation

## 2021-10-06 DIAGNOSIS — G4733 Obstructive sleep apnea (adult) (pediatric): Secondary | ICD-10-CM | POA: Diagnosis not present

## 2021-10-06 MED ORDER — IBUPROFEN 600 MG PO TABS: 600.0000 mg | ORAL_TABLET | Freq: Four times a day (QID) | ORAL | 0 refills | Status: AC | PRN

## 2021-10-06 MED ORDER — SENNOSIDES-DOCUSATE SODIUM 8.6-50 MG PO TABS: 2.0000 | ORAL_TABLET | Freq: Every day | ORAL | 0 refills | Status: DC

## 2021-10-06 MED ORDER — TRAMADOL HCL 50 MG PO TABS: 50.0000 mg | ORAL_TABLET | Freq: Four times a day (QID) | ORAL | 0 refills | Status: DC | PRN

## 2021-10-06 NOTE — Patient Instructions (Addendum)
Plan to have an ultrasound before surgery and Dr. Berline Lopes will notify you of the results. We have also placed a referral for you to meet with the genetics counselor here at the [facility].  Preparing for your Surgery  Plan for surgery on October 29, 2021 with Dr. Jeral Pinch at [facility] will be scheduled for robotic assisted total laparoscopic hysterectomy (removal of the uterus and cervix), bilateral salpingo-oophorectomy (removal of both ovaries and fallopian tubes), sentinel lymph node biopsy, possible lymph node dissection, possible laparotomy (larger incision on your abdomen if needed).   Pre-operative Testing -You will receive a phone call from presurgical testing at [facility] to arrange for a pre-operative appointment and lab work.  -Bring your insurance card, copy of an advanced directive if applicable, medication list  -At that visit, you will be asked to sign a consent for a possible blood transfusion in case a transfusion becomes necessary during surgery.  The need for a blood transfusion is rare but having consent is a necessary part of your care.     -You are fine to keep taking your baby aspirin ('81mg'$ ) with the last dose being the day before surgery.  -Do not take supplements such as fish oil (omega 3), red yeast rice, turmeric before your surgery. You want to avoid medications with aspirin in them including headache powders such as BC or Goody's), Excedrin migraine.  Day Before Surgery at [facility] will be asked to take in a light diet the day before surgery. You will be advised you can have clear liquids up until 3 hours before your surgery.    Eat a light diet the day before surgery.  Examples including soups, broths, toast, yogurt, mashed potatoes.  AVOID GAS PRODUCING FOODS. Things to avoid include carbonated beverages (fizzy beverages, sodas), raw fruits and raw vegetables (uncooked), or beans.   If your bowels are filled with gas, your  surgeon will have difficulty visualizing your pelvic organs which increases your surgical risks.  Your role in recovery Your role is to become active as soon as directed by your doctor, while still giving yourself time to heal.  Rest when you feel tired. You will be asked to do the following in order to speed your recovery:  - Cough and breathe deeply. This helps to clear and expand your lungs and can prevent pneumonia after surgery.  - [place]. Do mild physical activity. Walking or moving your legs help your circulation and body functions return to normal. Do not try to get up or walk alone the first time after surgery.   -If you develop swelling on one leg or the other, pain in the back of your leg, redness/warmth in one of your legs, please call the office or go to the Emergency Room to have a doppler to rule out a blood clot. For shortness of breath, chest pain-seek care in the Emergency Room as soon as possible. - Actively manage your pain. Managing your pain lets you move in comfort. We will ask you to rate your pain on a scale of zero to 10. It is your responsibility to tell your doctor or nurse where and how much you hurt so your pain can be treated.  Special Considerations -If you are diabetic, you may be placed on insulin after surgery to have closer control over your blood sugars to promote healing and recovery.  This does not mean that you will be discharged on insulin.  If applicable, your oral antidiabetics will be resumed when you are tolerating a solid diet.  -Your final pathology results from surgery should be available around one week after surgery and the results will be relayed to you when available.  -FMLA forms can be faxed to [phone] and please allow 5-7 business days for completion.  Pain Management After Surgery -You have been prescribed your pain medication and bowel regimen medications before surgery so that you can have these available when you  are discharged from the hospital. The pain medication is for use ONLY AFTER surgery and a new prescription will not be given.   -Make sure that you have Tylenol and Ibuprofen at home to use on a regular basis after surgery for pain control. We recommend alternating the medications every hour to six hours since they work differently and are processed in the body differently for pain relief.  -Review the attached handout on narcotic use and their risks and side effects.   Bowel Regimen -You have been prescribed Sennakot-S to take nightly to prevent constipation especially if you are taking the narcotic pain medication intermittently.  It is important to prevent constipation and drink adequate amounts of liquids. You can stop taking this medication when you are not taking pain medication and you are back on your normal bowel routine.  Risks of Surgery Risks of surgery are low but include bleeding, infection, damage to surrounding structures, re-operation, blood clots, and very rarely death.   Blood Transfusion Information (For the consent to be signed before surgery)  We will be checking your blood type before surgery so in case of emergencies, we will know what type of blood you would need.                                            WHAT IS A BLOOD TRANSFUSION?  A transfusion is the replacement of blood or some of its parts. Blood is made up of multiple cells which provide different functions. Red blood cells carry oxygen and are used for blood loss replacement. White blood cells fight against infection. Platelets control bleeding. Plasma helps clot blood. Other blood products are available for specialized needs, such as hemophilia or other clotting disorders. BEFORE THE TRANSFUSION  Who gives blood for transfusions?  You may be able to donate blood to be used at a later date on yourself (autologous donation). Relatives can be asked to donate blood. This is generally not any safer than if  you have received blood from a stranger. The same precautions are taken to ensure safety when a relative's blood is donated. Healthy volunteers who are fully evaluated to make sure their blood is safe. This is blood bank blood. Transfusion therapy is the safest it has ever been in the practice of medicine. Before blood is taken from a donor, a complete history is taken to make sure that person has no history of diseases nor engages in risky social behavior (examples are intravenous drug use or sexual activity with multiple partners). The donor's travel history is screened to minimize risk of transmitting infections, such as malaria. The donated blood is tested for signs of infectious diseases, such as HIV and hepatitis. The blood is then tested to be sure it is compatible with you in order to minimize the chance of a transfusion reaction. If you or a relative donates blood, this is often  done in anticipation of surgery and is not appropriate for emergency situations. It takes many days to process the donated blood. RISKS AND COMPLICATIONS Although transfusion therapy is very safe and saves many lives, the main dangers of transfusion include:  Getting an infectious disease. Developing a transfusion reaction. This is an allergic reaction to something in the blood you were given. Every precaution is taken to prevent this. The decision to have a blood transfusion has been considered carefully by your caregiver before blood is given. Blood is not given unless the benefits outweigh the risks.  AFTER SURGERY INSTRUCTIONS  Return to work: 4-6 weeks if applicable  Activity: 1. Be up and out of the bed during the day.  Take a nap if needed.  You may walk up steps but be careful and use the hand rail.  Stair climbing will tire you more than you think, you may need to stop part way and rest.   2. No lifting or straining for 6 weeks over 10 pounds. No pushing, pulling, straining for 6 weeks.  3. No driving for  around 1 week(s).  Do not drive if you are taking narcotic pain medicine and make sure that your reaction time has returned.   4. You can shower as soon as the next day after surgery. Shower daily.  Use your regular soap and water (not directly on the incision) and pat your incision(s) dry afterwards; don't rub.  No tub baths or submerging your body in water until cleared by your surgeon. If you have the soap that was given to you by pre-surgical testing that was used before surgery, you do not need to use it afterwards because this can irritate your incisions.   5. No sexual activity and nothing in the vagina for 8 weeks.  6. You may experience a small amount of clear drainage from your incisions, which is normal.  If the drainage persists, increases, or changes color please call the office.  7. Do not use creams, lotions, or ointments such as neosporin on your incisions after surgery until advised by your surgeon because they can cause removal of the dermabond glue on your incisions.    8. You may experience vaginal spotting after surgery or around the 6-8 week mark from surgery when the stitches at the top of the vagina begin to dissolve.  The spotting is normal but if you experience heavy bleeding, call our office.  9. Take Tylenol or ibuprofen first for pain and only use narcotic pain medication for severe pain not relieved by the Tylenol or Ibuprofen.  Monitor your Tylenol intake to a max of 4,000 mg in a 24 hour period. You can alternate these medications after surgery.  Diet: 1. Low sodium Heart Healthy Diet is recommended but you are cleared to resume your normal (before surgery) diet after your procedure.  2. It is safe to use a laxative, such as Miralax or Colace, if you have difficulty moving your bowels. You have been prescribed Sennakot-S to take at bedtime every evening after surgery to keep bowel movements regular and to prevent constipation.    Wound Care: 1. Keep clean and dry.   Shower daily.  Reasons to call the Doctor: Fever - Oral temperature greater than 100.4 degrees Fahrenheit Foul-smelling vaginal discharge Difficulty urinating Nausea and vomiting Increased pain at the site of the incision that is unrelieved with pain medicine. Difficulty breathing with or without chest pain New calf pain especially if only on one side Sudden, continuing  increased vaginal bleeding with or without clots.   Contacts: For questions or concerns you should contact:  Dr. Jeral Pinch at [phone]  Joylene John, NP at [phone]  After Hours: call [phone] and have the GYN Oncologist paged/contacted (after 5 pm or on the weekends).  Messages sent via mychart are for non-urgent matters and are not responded to after hours so for urgent needs, please call the after hours number.

## 2021-10-08 NOTE — Progress Notes (Addendum)
Anesthesia Review:  PCP: cory Nafziger  LOV 09/18/21  Cardiologist : none  Chest x-ray : EKG :03/25/2021  Echo : Stress test: Cardiac Cath :  Activity level:  can do a flight of stairs without difficulty  Sleep Study/ CPAP : has cpap  Fasting Blood Sugar :      / Checks Blood Sugar -- times a day:   Blood Thinner/ Instructions /Last Dose: ASA / Instructions/ Last Dose :   DM- type  2 /Prediabetes per pt  Does not check glucose at home  Hgba1c- 10/16/21- 6.3  Retired Radio producer

## 2021-10-10 ENCOUNTER — Other Ambulatory Visit (HOSPITAL_COMMUNITY): Payer: Medicare PPO

## 2021-10-10 NOTE — Patient Instructions (Signed)
Plan to have an ultrasound before surgery and Dr. Berline Lopes will notify you of the results. We have also placed a referral for you to meet with the genetics counselor here at the [facility].   Preparing for your Surgery   Plan for surgery on October 29, 2021 with Dr. Jeral Pinch at [place] will be scheduled for robotic assisted total laparoscopic hysterectomy (removal of the uterus and cervix), bilateral salpingo-oophorectomy (removal of both ovaries and fallopian tubes), sentinel lymph node biopsy, possible lymph node dissection, possible laparotomy (larger incision on your abdomen if needed).    Pre-operative Testing -You will receive a phone call from presurgical testing at [facility] to arrange for a pre-operative appointment and lab work.   -Bring your insurance card, copy of an advanced directive if applicable, medication list   -At that visit, you will be asked to sign a consent for a possible blood transfusion in case a transfusion becomes necessary during surgery.  The need for a blood transfusion is rare but having consent is a necessary part of your care.      -You are fine to keep taking your baby aspirin ('81mg'$ ) with the last dose being the day before surgery.   -Do not take supplements such as fish oil (omega 3), red yeast rice, turmeric before your surgery. You want to avoid medications with aspirin in them including headache powders such as BC or Goody's), Excedrin migraine.   Day Before Surgery at [place] will be asked to take in a light diet the day before surgery. You will be advised you can have clear liquids up until 3 hours before your surgery.     Eat a light diet the day before surgery.  Examples including soups, broths, toast, yogurt, mashed potatoes.  AVOID GAS PRODUCING FOODS. Things to avoid include carbonated beverages (fizzy beverages, sodas), raw fruits and raw vegetables (uncooked), or beans.    If your bowels are filled with gas,  your surgeon will have difficulty visualizing your pelvic organs which increases your surgical risks.   Your role in recovery Your role is to become active as soon as directed by your doctor, while still giving yourself time to heal.  Rest when you feel tired. You will be asked to do the following in order to speed your recovery:   - Cough and breathe deeply. This helps to clear and expand your lungs and can prevent pneumonia after surgery.  - [place]. Do mild physical activity. Walking or moving your legs help your circulation and body functions return to normal. Do not try to get up or walk alone the first time after surgery.   -If you develop swelling on one leg or the other, pain in the back of your leg, redness/warmth in one of your legs, please call the office or go to the Emergency Room to have a doppler to rule out a blood clot. For shortness of breath, chest pain-seek care in the Emergency Room as soon as possible. - Actively manage your pain. Managing your pain lets you move in comfort. We will ask you to rate your pain on a scale of zero to 10. It is your responsibility to tell your doctor or nurse where and how much you hurt so your pain can be treated.   Special Considerations -If you are diabetic, you may be placed on insulin after surgery to have closer control over your blood sugars to promote healing and recovery.  This does not mean that you will be discharged on insulin.  If applicable, your oral antidiabetics will be resumed when you are tolerating a solid diet.   -Your final pathology results from surgery should be available around one week after surgery and the results will be relayed to you when available.   -FMLA forms can be faxed to [phone] and please allow 5-7 business days for completion.   Pain Management After Surgery -You have been prescribed your pain medication and bowel regimen medications before surgery so that you can have these  available when you are discharged from the hospital. The pain medication is for use ONLY AFTER surgery and a new prescription will not be given.    -Make sure that you have Tylenol and Ibuprofen at home to use on a regular basis after surgery for pain control. We recommend alternating the medications every hour to six hours since they work differently and are processed in the body differently for pain relief.   -Review the attached handout on narcotic use and their risks and side effects.    Bowel Regimen -You have been prescribed Sennakot-S to take nightly to prevent constipation especially if you are taking the narcotic pain medication intermittently.  It is important to prevent constipation and drink adequate amounts of liquids. You can stop taking this medication when you are not taking pain medication and you are back on your normal bowel routine.   Risks of Surgery Risks of surgery are low but include bleeding, infection, damage to surrounding structures, re-operation, blood clots, and very rarely death.     Blood Transfusion Information (For the consent to be signed before surgery)   We will be checking your blood type before surgery so in case of emergencies, we will know what type of blood you would need.                                             WHAT IS A BLOOD TRANSFUSION?   A transfusion is the replacement of blood or some of its parts. Blood is made up of multiple cells which provide different functions. Red blood cells carry oxygen and are used for blood loss replacement. White blood cells fight against infection. Platelets control bleeding. Plasma helps clot blood. Other blood products are available for specialized needs, such as hemophilia or other clotting disorders. BEFORE THE TRANSFUSION  Who gives blood for transfusions?  You may be able to donate blood to be used at a later date on yourself (autologous donation). Relatives can be asked to donate blood. This is  generally not any safer than if you have received blood from a stranger. The same precautions are taken to ensure safety when a relative's blood is donated. Healthy volunteers who are fully evaluated to make sure their blood is safe. This is blood bank blood. Transfusion therapy is the safest it has ever been in the practice of medicine. Before blood is taken from a donor, a complete history is taken to make sure that person has no history of diseases nor engages in risky social behavior (examples are intravenous drug use or sexual activity with multiple partners). The donor's travel history is screened to minimize risk of transmitting infections, such as malaria. The donated blood is tested for signs of infectious diseases, such as HIV and hepatitis. The blood is then tested to be sure it  is compatible with you in order to minimize the chance of a transfusion reaction. If you or a relative donates blood, this is often done in anticipation of surgery and is not appropriate for emergency situations. It takes many days to process the donated blood. RISKS AND COMPLICATIONS Although transfusion therapy is very safe and saves many lives, the main dangers of transfusion include:  Getting an infectious disease. Developing a transfusion reaction. This is an allergic reaction to something in the blood you were given. Every precaution is taken to prevent this. The decision to have a blood transfusion has been considered carefully by your caregiver before blood is given. Blood is not given unless the benefits outweigh the risks.   AFTER SURGERY INSTRUCTIONS   Return to work: 4-6 weeks if applicable   Activity: 1. Be up and out of the bed during the day.  Take a nap if needed.  You may walk up steps but be careful and use the hand rail.  Stair climbing will tire you more than you think, you may need to stop part way and rest.    2. No lifting or straining for 6 weeks over 10 pounds. No pushing, pulling,  straining for 6 weeks.   3. No driving for around 1 week(s).  Do not drive if you are taking narcotic pain medicine and make sure that your reaction time has returned.    4. You can shower as soon as the next day after surgery. Shower daily.  Use your regular soap and water (not directly on the incision) and pat your incision(s) dry afterwards; don't rub.  No tub baths or submerging your body in water until cleared by your surgeon. If you have the soap that was given to you by pre-surgical testing that was used before surgery, you do not need to use it afterwards because this can irritate your incisions.    5. No sexual activity and nothing in the vagina for 8 weeks.   6. You may experience a small amount of clear drainage from your incisions, which is normal.  If the drainage persists, increases, or changes color please call the office.   7. Do not use creams, lotions, or ointments such as neosporin on your incisions after surgery until advised by your surgeon because they can cause removal of the dermabond glue on your incisions.     8. You may experience vaginal spotting after surgery or around the 6-8 week mark from surgery when the stitches at the top of the vagina begin to dissolve.  The spotting is normal but if you experience heavy bleeding, call our office.   9. Take Tylenol or ibuprofen first for pain and only use narcotic pain medication for severe pain not relieved by the Tylenol or Ibuprofen.  Monitor your Tylenol intake to a max of 4,000 mg in a 24 hour period. You can alternate these medications after surgery.   Diet: 1. Low sodium Heart Healthy Diet is recommended but you are cleared to resume your normal (before surgery) diet after your procedure.   2. It is safe to use a laxative, such as Miralax or Colace, if you have difficulty moving your bowels. You have been prescribed Sennakot-S to take at bedtime every evening after surgery to keep bowel movements regular and to prevent  constipation.     Wound Care: 1. Keep clean and dry.  Shower daily.   Reasons to call the Doctor: Fever - Oral temperature greater than 100.4 degrees Fahrenheit Foul-smelling  vaginal discharge Difficulty urinating Nausea and vomiting Increased pain at the site of the incision that is unrelieved with pain medicine. Difficulty breathing with or without chest pain New calf pain especially if only on one side Sudden, continuing increased vaginal bleeding with or without clots.   Contacts: For questions or concerns you should contact:   Dr. Jeral Pinch at [phone]   Joylene John, NP at [phone]   After Hours: call [phone] and have the GYN Oncologist paged/contacted (after 5 pm or on the weekends).   Messages sent via mychart are for non-urgent matters and are not responded to after hours so for urgent needs, please call the after hours number.

## 2021-10-10 NOTE — Progress Notes (Signed)
Patient here with her husband for new patient consultation with Dr. Jeral Pinch and for a pre-operative discussion prior to her scheduled surgery on October 29, 2021. She is scheduled for robotic assisted total laparoscopic hysterectomy, bilateral salpingo-oophorectomy, sentinel lymph node biopsy, possible lymph node dissection, possible laparotomy. The surgery was discussed in detail.  See after visit summary for additional details. Visual aids used to discuss items related to surgery including sequential compression stockings, foley catheter, IV pump, multi-modal pain regimen including tylenol, photo of the surgical robot, female reproductive system to discuss surgery in detail.      Discussed post-op pain management in detail including the aspects of the enhanced recovery pathway.  Advised her that a new prescription would be sent in for tramadol and it is only to be used for after her upcoming surgery.  We discussed the use of tylenol post-op and to monitor for a maximum of 4,000 mg in a 24 hour period.  Also prescribed sennakot to be used after surgery and to hold if having loose stools.  Discussed bowel regimen in detail.     Discussed the use of SCDs and measures to take at home to prevent DVT including frequent mobility.  Reportable signs and symptoms of DVT discussed. Post-operative instructions discussed and expectations for after surgery. Incisional care discussed as well including reportable signs and symptoms including erythema, drainage, wound separation.     10 minutes spent with the patient.  Verbalizing understanding of material discussed. No needs or concerns voiced at the end of the visit.   Advised patient and family to call for any needs.  Advised that her post-operative medications had been prescribed and could be picked up at any time.    This appointment is included in the global surgical bundle as pre-operative teaching and has no charge.

## 2021-10-14 ENCOUNTER — Ambulatory Visit (HOSPITAL_COMMUNITY): Payer: Medicare PPO

## 2021-10-15 ENCOUNTER — Ambulatory Visit (HOSPITAL_COMMUNITY)
Admission: RE | Admit: 2021-10-15 | Discharge: 2021-10-15 | Disposition: A | Discharge status: Home / Self Care | Payer: Medicare PPO | Source: Ambulatory Visit | Attending: Gynecologic Oncology | Admitting: Gynecologic Oncology

## 2021-10-15 DIAGNOSIS — R9389 Abnormal findings on diagnostic imaging of other specified body structures: Secondary | ICD-10-CM | POA: Diagnosis not present

## 2021-10-15 DIAGNOSIS — N888 Other specified noninflammatory disorders of cervix uteri: Secondary | ICD-10-CM | POA: Diagnosis not present

## 2021-10-15 DIAGNOSIS — C541 Malignant neoplasm of endometrium: Secondary | ICD-10-CM | POA: Insufficient documentation

## 2021-10-15 NOTE — Progress Notes (Signed)
DUE TO COVID-19 ONLY ONE VISITOR IS ALLOWED TO COME WITH YOU AND STAY IN THE WAITING ROOM ONLY DURING PRE OP AND PROCEDURE DAY OF SURGERY.  2 VISITOR  MAY VISIT WITH YOU AFTER SURGERY IN YOUR PRIVATE ROOM DURING VISITING HOURS ONLY! YOU MAY HAVE ONE PERSON SPEND THE NITE WITH YOU IN YOUR ROOM AFTER SURGERY.     Your procedure is scheduled on:           10/29/2021   Report to [facility] Main  Entrance   Report to admitting at       0830          AM DO NOT BRING INSURANCE CARD, PICTURE ID OR WALLET DAY OF SURGERY.      Call this number if you have problems the morning of surgery [phone]  EAt a lgiht diet the day before surgeyr.  Avoi d  gas producing foods.     REMEMBER: NO  SOLID FOODS , CANDY, GUM OR MINTS AFTER MIDNITE THE NITE BEFORE SURGERY .       Marland Kitchen CLEAR LIQUIDS UNTIL    0745am             DAY OF SURGERY.      PLEASE FINISH  g2 lower sugar  DRINK PER SURGEON ORDER  WHICH NEEDS TO BE COMPLETED AT    0745am      MORNING OF SURGERY.       CLEAR LIQUID DIET   Foods Allowed      WATER BLACK COFFEE ( SUGAR OK, NO MILK, CREAM OR CREAMER) REGULAR AND DECAF  TEA ( SUGAR OK NO MILK, CREAM, OR CREAMER) REGULAR AND DECAF  PLAIN JELLO ( NO RED)  FRUIT ICES ( NO RED, NO FRUIT PULP)  POPSICLES ( NO RED)  JUICE- APPLE, WHITE GRAPE AND WHITE CRANBERRY  SPORT DRINK LIKE GATORADE ( NO RED)  CLEAR BROTH ( VEGETABLE , CHICKEN OR BEEF)                                                                     BRUSH YOUR TEETH MORNING OF SURGERY AND RINSE YOUR MOUTH OUT, NO CHEWING GUM CANDY OR MINTS.     Take these medicines the morning of surgery with A SIP OF WATER:  none    DO NOT TAKE ANY DIABETIC MEDICATIONS DAY OF YOUR SURGERY                               You may not have any metal on your body including hair pins and              piercings  Do not wear jewelry, make-up, lotions, powders or perfumes, deodorant             Do not wear nail polish on your fingernails.               IF YOU ARE A FEMALE AND WANT TO SHAVE UNDER ARMS OR LEGS PRIOR TO SURGERY YOU MUST DO SO AT LEAST 48 HOURS PRIOR TO SURGERY.              Men may shave face and neck.   Do  not bring valuables to the hospital. [place].  Contacts, dentures or bridgework may not be worn into surgery.  Leave suitcase in the car. After surgery it may be brought to your room.     Patients discharged the day of surgery will not be allowed to drive home. IF YOU ARE HAVING SURGERY AND GOING HOME THE SAME DAY, YOU MUST HAVE AN ADULT TO DRIVE YOU HOME AND BE WITH YOU FOR 24 HOURS. YOU MAY GO HOME BY TAXI OR UBER OR ORTHERWISE, BUT AN ADULT MUST ACCOMPANY YOU HOME AND STAY WITH YOU FOR 24 HOURS.                Please read over the following fact sheets you were given: _____________________________________________________________________  [facility] - Preparing for Surgery Before surgery, you can play an important role.  Because skin is not sterile, your skin needs to be as free of germs as possible.  You can reduce the number of germs on your skin by washing with CHG (chlorahexidine gluconate) soap before surgery.  CHG is an antiseptic cleaner which kills germs and bonds with the skin to continue killing germs even after washing. Please DO NOT use if you have an allergy to CHG or antibacterial soaps.  If your skin becomes reddened/irritated stop using the CHG and inform your nurse when you arrive at Short Stay. Do not shave (including legs and underarms) for at least 48 hours prior to the first CHG shower.  You may shave your face/neck. Please follow these instructions carefully:  1.  Shower with CHG Soap the night before surgery and the  morning of Surgery.  2.  If you choose to wash your hair, wash your hair first as usual with your  normal  shampoo.  3.  After you shampoo, rinse your hair and body thoroughly to remove the  shampoo.                           4.   Use CHG as you would any other liquid soap.  You can apply chg directly  to the skin and wash                       Gently with a scrungie or clean washcloth.  5.  Apply the CHG Soap to your body ONLY FROM THE NECK DOWN.   Do not use on face/ open                           Wound or open sores. Avoid contact with eyes, ears mouth and genitals (private parts).                       Wash face,  Genitals (private parts) with your normal soap.             6.  Wash thoroughly, paying special attention to the area where your surgery  will be performed.  7.  Thoroughly rinse your body with warm water from the neck down.  8.  DO NOT shower/wash with your normal soap after using and rinsing off  the CHG Soap.                9.  Pat yourself dry with a clean towel.  10.  Wear clean pajamas.            11.  Place clean sheets on your bed the night of your first shower and do not  sleep with pets. Day of Surgery : Do not apply any lotions/deodorants the morning of surgery.  Please wear clean clothes to the hospital/surgery center.  FAILURE TO FOLLOW THESE INSTRUCTIONS MAY RESULT IN THE CANCELLATION OF YOUR SURGERY PATIENT SIGNATURE_________________________________  NURSE SIGNATURE__________________________________  ________________________________________________________________________

## 2021-10-16 ENCOUNTER — Other Ambulatory Visit: Payer: Self-pay

## 2021-10-16 ENCOUNTER — Telehealth: Payer: Self-pay | Admitting: *Deleted

## 2021-10-16 ENCOUNTER — Encounter (HOSPITAL_COMMUNITY): Payer: Self-pay

## 2021-10-16 ENCOUNTER — Encounter (HOSPITAL_COMMUNITY)
Admission: RE | Admit: 2021-10-16 | Discharge: 2021-10-16 | Disposition: A | Discharge status: Home / Self Care | Payer: Medicare PPO | Source: Ambulatory Visit | Attending: Gynecologic Oncology | Admitting: Gynecologic Oncology

## 2021-10-16 VITALS — BP 151/76 | HR 72 | Temp 98.5°F | Resp 16 | Ht 64.0 in | Wt 227.0 lb

## 2021-10-16 DIAGNOSIS — Z7985 Long-term (current) use of injectable non-insulin antidiabetic drugs: Secondary | ICD-10-CM | POA: Insufficient documentation

## 2021-10-16 DIAGNOSIS — C541 Malignant neoplasm of endometrium: Secondary | ICD-10-CM | POA: Insufficient documentation

## 2021-10-16 DIAGNOSIS — E119 Type 2 diabetes mellitus without complications: Secondary | ICD-10-CM | POA: Insufficient documentation

## 2021-10-16 DIAGNOSIS — Z01818 Encounter for other preprocedural examination: Secondary | ICD-10-CM

## 2021-10-16 DIAGNOSIS — Z01812 Encounter for preprocedural laboratory examination: Secondary | ICD-10-CM | POA: Diagnosis not present

## 2021-10-16 HISTORY — DX: Unspecified osteoarthritis, unspecified site: M19.90

## 2021-10-16 HISTORY — DX: Other specified postprocedural states: Z98.890

## 2021-10-16 LAB — CBC
HCT: 43.9 % (ref 36.0–46.0)
Hemoglobin: 14.3 g/dL (ref 12.0–15.0)
MCH: 29.1 pg (ref 26.0–34.0)
MCHC: 32.6 g/dL (ref 30.0–36.0)
MCV: 89.4 fL (ref 80.0–100.0)
Platelets: 317 10*3/uL (ref 150–400)
RBC: 4.91 MIL/uL (ref 3.87–5.11)
RDW: 13.8 % (ref 11.5–15.5)
WBC: 7.4 10*3/uL (ref 4.0–10.5)
nRBC: 0 % (ref 0.0–0.2)

## 2021-10-16 LAB — COMPREHENSIVE METABOLIC PANEL
ALT: 29 U/L (ref 0–44)
AST: 21 U/L (ref 15–41)
Albumin: 4.3 g/dL (ref 3.5–5.0)
Alkaline Phosphatase: 68 U/L (ref 38–126)
Anion gap: 9 (ref 5–15)
BUN: 21 mg/dL (ref 8–23)
CO2: 30 mmol/L (ref 22–32)
Calcium: 9.9 mg/dL (ref 8.9–10.3)
Chloride: 102 mmol/L (ref 98–111)
Creatinine, Ser: 0.91 mg/dL (ref 0.44–1.00)
GFR, Estimated: >60 mL/min (ref >60)
Glucose, Bld: 106 mg/dL — ABNORMAL HIGH (ref 70–99)
Potassium: 4.1 mmol/L (ref 3.5–5.1)
Sodium: 141 mmol/L (ref 135–145)
Total Bilirubin: 0.5 mg/dL (ref 0.3–1.2)
Total Protein: 8.2 g/dL — ABNORMAL HIGH (ref 6.5–8.1)

## 2021-10-16 LAB — HEMOGLOBIN A1C
Hgb A1c MFr Bld: 6.3 % — ABNORMAL HIGH (ref 4.8–5.6)
Mean Plasma Glucose: 134.11 mg/dL

## 2021-10-16 LAB — GLUCOSE, CAPILLARY: Glucose-Capillary: 99 mg/dL (ref 70–99)

## 2021-10-16 NOTE — Telephone Encounter (Signed)
Spoke with pt today and offered her an earlier surgery date of June 7th, 2023. She is agreeable to the earlier appointment date. Joylene John, Np notified.

## 2021-10-16 NOTE — Telephone Encounter (Signed)
Patient called to reschedule her genetics appt to a later day.

## 2021-10-17 ENCOUNTER — Ambulatory Visit: Payer: Medicare PPO | Admitting: Gynecologic Oncology

## 2021-10-21 ENCOUNTER — Telehealth: Payer: Self-pay

## 2021-10-21 NOTE — Telephone Encounter (Signed)
error 

## 2021-10-21 NOTE — Anesthesia Preprocedure Evaluation (Addendum)
Anesthesia Evaluation  Patient identified by MRN, date of birth, ID band Patient awake    Reviewed: Allergy & Precautions, NPO status , Patient's Chart, lab work & pertinent test results  History of Anesthesia Complications (+) PONV and history of anesthetic complications  Airway Mallampati: II  TM Distance: >3 FB Neck ROM: Full    Dental no notable dental hx. (+) Teeth Intact, Dental Advisory Given   Pulmonary sleep apnea and Continuous Positive Airway Pressure Ventilation ,    Pulmonary exam normal breath sounds clear to auscultation       Cardiovascular hypertension, Pt. on medications Normal cardiovascular exam Rhythm:Regular Rate:Normal     Neuro/Psych negative neurological ROS     GI/Hepatic negative GI ROS, Neg liver ROS,   Endo/Other  diabetes  Renal/GU Lab Results      Component                Value               Date                      CREATININE               0.91                10/16/2021                K                        4.1                 10/16/2021                Female GU complaint (Endometrial CA Stage 1)     Musculoskeletal  (+) Arthritis ,   Abdominal (+) + obese (BMI 39),   Peds  Hematology Lab Results      Component                Value               Date                           HGB                      14.3                10/16/2021                HCT                      43.9                10/16/2021                 PLT                      317                 10/16/2021              Anesthesia Other Findings   Reproductive/Obstetrics                           Anesthesia Physical Anesthesia Plan  ASA: 3  Anesthesia Plan: General  Post-op Pain Management: Lidocaine infusion* and Ketamine IV*   Induction: Intravenous  PONV Risk Score and Plan: Treatment may vary due to age or medical condition, Scopolamine patch - Pre-op, Ondansetron and  Dexamethasone  Airway Management Planned: Oral ETT  Additional Equipment: None  Intra-op Plan:   Post-operative Plan: Extubation in OR  Informed Consent: I have reviewed the patients History and Physical, chart, labs and discussed the procedure including the risks, benefits and alternatives for the proposed anesthesia with the patient or authorized representative who has indicated his/her understanding and acceptance.     Dental advisory given  Plan Discussed with: CRNA and Anesthesiologist  Anesthesia Plan Comments:        Anesthesia Quick Evaluation

## 2021-10-21 NOTE — Telephone Encounter (Signed)
Telephone call to check on pre-operative status.  Patient compliant with pre-operative instructions.  Reinforced nothing to eat after midnight. Clear liquids until 5:30am. Patient to arrive at 6:15am.  No questions or concerns voiced.  Instructed to call for any needs.  

## 2021-10-22 ENCOUNTER — Encounter (HOSPITAL_COMMUNITY)
Admission: RE | Disposition: A | Discharge status: Home / Self Care | Payer: Self-pay | Source: Ambulatory Visit | Attending: Gynecologic Oncology

## 2021-10-22 ENCOUNTER — Ambulatory Visit (HOSPITAL_COMMUNITY): Payer: Medicare PPO | Admitting: Physician Assistant

## 2021-10-22 ENCOUNTER — Ambulatory Visit (HOSPITAL_COMMUNITY)
Admission: RE | Admit: 2021-10-22 | Discharge: 2021-10-22 | Disposition: A | Discharge status: Home / Self Care | Payer: Medicare PPO | Source: Ambulatory Visit | Attending: Gynecologic Oncology | Admitting: Gynecologic Oncology

## 2021-10-22 ENCOUNTER — Other Ambulatory Visit: Payer: Self-pay

## 2021-10-22 ENCOUNTER — Ambulatory Visit (HOSPITAL_BASED_OUTPATIENT_CLINIC_OR_DEPARTMENT_OTHER): Payer: Medicare PPO | Admitting: Anesthesiology

## 2021-10-22 ENCOUNTER — Encounter (HOSPITAL_COMMUNITY): Payer: Self-pay | Admitting: Gynecologic Oncology

## 2021-10-22 DIAGNOSIS — C541 Malignant neoplasm of endometrium: Secondary | ICD-10-CM

## 2021-10-22 DIAGNOSIS — Z800 Family history of malignant neoplasm of digestive organs: Secondary | ICD-10-CM | POA: Diagnosis not present

## 2021-10-22 DIAGNOSIS — Z7985 Long-term (current) use of injectable non-insulin antidiabetic drugs: Secondary | ICD-10-CM

## 2021-10-22 DIAGNOSIS — E119 Type 2 diabetes mellitus without complications: Secondary | ICD-10-CM

## 2021-10-22 DIAGNOSIS — Z6841 Body Mass Index (BMI) 40.0 and over, adult: Secondary | ICD-10-CM

## 2021-10-22 DIAGNOSIS — G4733 Obstructive sleep apnea (adult) (pediatric): Secondary | ICD-10-CM

## 2021-10-22 DIAGNOSIS — I10 Essential (primary) hypertension: Secondary | ICD-10-CM | POA: Diagnosis not present

## 2021-10-22 DIAGNOSIS — Z9989 Dependence on other enabling machines and devices: Secondary | ICD-10-CM | POA: Diagnosis not present

## 2021-10-22 HISTORY — PX: ROBOTIC ASSISTED TOTAL HYSTERECTOMY WITH BILATERAL SALPINGO OOPHERECTOMY: SHX6086

## 2021-10-22 HISTORY — PX: LYMPH NODE BIOPSY: SHX201

## 2021-10-22 LAB — TYPE AND SCREEN
ABO/RH(D): O POS
Antibody Screen: NEGATIVE

## 2021-10-22 LAB — GLUCOSE, CAPILLARY
Glucose-Capillary: 123 mg/dL — ABNORMAL HIGH (ref 70–99)
Glucose-Capillary: 149 mg/dL — ABNORMAL HIGH (ref 70–99)

## 2021-10-22 LAB — ABO/RH: ABO/RH(D): O POS

## 2021-10-22 SURGERY — HYSTERECTOMY, TOTAL, ROBOT-ASSISTED, LAPAROSCOPIC, WITH BILATERAL SALPINGO-OOPHORECTOMY
Anesthesia: General | Site: Pelvis

## 2021-10-22 MED ORDER — KETAMINE HCL 10 MG/ML IJ SOLN
INTRAMUSCULAR | Status: DC | PRN
  Administered 2021-10-22: 10 mg via INTRAVENOUS
  Administered 2021-10-22: 20 mg via INTRAVENOUS

## 2021-10-22 MED ORDER — CHLORHEXIDINE GLUCONATE 0.12 % MT SOLN
15.0000 mL | Freq: Once | OROMUCOSAL | Status: AC
  Administered 2021-10-22: 15 mL via OROMUCOSAL

## 2021-10-22 MED ORDER — ONDANSETRON HCL 4 MG/2ML IJ SOLN
INTRAMUSCULAR | Status: AC
  Filled 2021-10-22: qty 2

## 2021-10-22 MED ORDER — LIDOCAINE HCL (PF) 2 % IJ SOLN
INTRAMUSCULAR | Status: AC
  Filled 2021-10-22: qty 15

## 2021-10-22 MED ORDER — STERILE WATER FOR INJECTION IJ SOLN
INTRAMUSCULAR | Status: DC | PRN
  Administered 2021-10-22: 4 mL via INTRAMUSCULAR

## 2021-10-22 MED ORDER — ORAL CARE MOUTH RINSE
15.0000 mL | Freq: Once | OROMUCOSAL | Status: AC
Start: 2021-10-22 — End: 2021-10-22

## 2021-10-22 MED ORDER — ROCURONIUM BROMIDE 100 MG/10ML IV SOLN
INTRAVENOUS | Status: DC | PRN
  Administered 2021-10-22: 30 mg via INTRAVENOUS
  Administered 2021-10-22: 70 mg via INTRAVENOUS

## 2021-10-22 MED ORDER — LACTATED RINGERS IV SOLN: INTRAVENOUS | Status: DC

## 2021-10-22 MED ORDER — STERILE WATER FOR IRRIGATION IR SOLN
Status: DC | PRN
  Administered 2021-10-22: 1000 mL

## 2021-10-22 MED ORDER — PHENYLEPHRINE 80 MCG/ML (10ML) SYRINGE FOR IV PUSH (FOR BLOOD PRESSURE SUPPORT)
PREFILLED_SYRINGE | INTRAVENOUS | Status: AC
  Filled 2021-10-22: qty 10

## 2021-10-22 MED ORDER — ONDANSETRON HCL 4 MG/2ML IJ SOLN: 4.0000 mg | Freq: Once | INTRAMUSCULAR | Status: DC | PRN

## 2021-10-22 MED ORDER — OXYCODONE HCL 5 MG PO TABS
ORAL_TABLET | ORAL | Status: AC
  Filled 2021-10-22: qty 1

## 2021-10-22 MED ORDER — STERILE WATER FOR INJECTION IJ SOLN
INTRAMUSCULAR | Status: DC | PRN
  Administered 2021-10-22: 1 mL

## 2021-10-22 MED ORDER — HYDROMORPHONE HCL 1 MG/ML IJ SOLN
0.2500 mg | INTRAMUSCULAR | Status: DC | PRN
  Administered 2021-10-22 (×2): 0.5 mg via INTRAVENOUS

## 2021-10-22 MED ORDER — KETOROLAC TROMETHAMINE 30 MG/ML IJ SOLN
INTRAMUSCULAR | Status: AC
  Filled 2021-10-22: qty 1

## 2021-10-22 MED ORDER — FENTANYL CITRATE (PF) 250 MCG/5ML IJ SOLN
INTRAMUSCULAR | Status: AC
  Filled 2021-10-22: qty 5

## 2021-10-22 MED ORDER — DEXAMETHASONE SODIUM PHOSPHATE 4 MG/ML IJ SOLN
4.0000 mg | INTRAMUSCULAR | Status: AC
  Administered 2021-10-22: 8 mg via INTRAVENOUS

## 2021-10-22 MED ORDER — PROPOFOL 10 MG/ML IV BOLUS
INTRAVENOUS | Status: AC
  Filled 2021-10-22: qty 20

## 2021-10-22 MED ORDER — HEPARIN SODIUM (PORCINE) 5000 UNIT/ML IJ SOLN
5000.0000 [IU] | INTRAMUSCULAR | Status: AC
  Administered 2021-10-22: 5000 [IU] via SUBCUTANEOUS
  Filled 2021-10-22: qty 1

## 2021-10-22 MED ORDER — KETOROLAC TROMETHAMINE 30 MG/ML IJ SOLN
30.0000 mg | Freq: Once | INTRAMUSCULAR | Status: AC | PRN
  Administered 2021-10-22: 30 mg via INTRAVENOUS

## 2021-10-22 MED ORDER — LIDOCAINE HCL (CARDIAC) PF 100 MG/5ML IV SOSY
PREFILLED_SYRINGE | INTRAVENOUS | Status: DC | PRN
  Administered 2021-10-22: 100 mg via INTRAVENOUS

## 2021-10-22 MED ORDER — ONDANSETRON HCL 4 MG/2ML IJ SOLN
INTRAMUSCULAR | Status: DC | PRN
  Administered 2021-10-22: 4 mg via INTRAVENOUS

## 2021-10-22 MED ORDER — HYDROMORPHONE HCL 1 MG/ML IJ SOLN
INTRAMUSCULAR | Status: AC
  Administered 2021-10-22: 0.5 mg via INTRAVENOUS
  Filled 2021-10-22: qty 2

## 2021-10-22 MED ORDER — LACTATED RINGERS IR SOLN
Status: DC | PRN
  Administered 2021-10-22: 1000 mL

## 2021-10-22 MED ORDER — KETAMINE HCL 10 MG/ML IJ SOLN
INTRAMUSCULAR | Status: AC
Start: 2021-10-22 — End: ?
  Filled 2021-10-22: qty 1

## 2021-10-22 MED ORDER — LIDOCAINE HCL (PF) 2 % IJ SOLN
INTRAMUSCULAR | Status: DC | PRN
  Administered 2021-10-22: 1.5 mg/kg/h via INTRADERMAL

## 2021-10-22 MED ORDER — OXYCODONE HCL 5 MG/5ML PO SOLN: 5.0000 mg | Freq: Once | ORAL | Status: AC | PRN

## 2021-10-22 MED ORDER — PHENYLEPHRINE HCL (PRESSORS) 10 MG/ML IV SOLN
INTRAVENOUS | Status: DC | PRN
  Administered 2021-10-22: 100 ug via INTRAVENOUS

## 2021-10-22 MED ORDER — LACTATED RINGERS IV SOLN: INTRAVENOUS | Status: DC | PRN

## 2021-10-22 MED ORDER — MIDAZOLAM HCL 5 MG/5ML IJ SOLN
INTRAMUSCULAR | Status: DC | PRN
  Administered 2021-10-22: 2 mg via INTRAVENOUS

## 2021-10-22 MED ORDER — BUPIVACAINE HCL 0.25 % IJ SOLN
INTRAMUSCULAR | Status: AC
  Filled 2021-10-22: qty 1

## 2021-10-22 MED ORDER — MIDAZOLAM HCL 2 MG/2ML IJ SOLN
INTRAMUSCULAR | Status: AC
  Filled 2021-10-22: qty 2

## 2021-10-22 MED ORDER — SUGAMMADEX SODIUM 500 MG/5ML IV SOLN
INTRAVENOUS | Status: DC | PRN
  Administered 2021-10-22: 200 mg via INTRAVENOUS

## 2021-10-22 MED ORDER — DEXAMETHASONE SODIUM PHOSPHATE 10 MG/ML IJ SOLN
INTRAMUSCULAR | Status: AC
  Filled 2021-10-22: qty 1

## 2021-10-22 MED ORDER — OXYCODONE HCL 5 MG PO TABS
5.0000 mg | ORAL_TABLET | Freq: Once | ORAL | Status: AC | PRN
  Administered 2021-10-22: 5 mg via ORAL

## 2021-10-22 MED ORDER — ACETAMINOPHEN 500 MG PO TABS
1000.0000 mg | ORAL_TABLET | ORAL | Status: AC
  Administered 2021-10-22: 1000 mg via ORAL
  Filled 2021-10-22: qty 2

## 2021-10-22 MED ORDER — FENTANYL CITRATE (PF) 100 MCG/2ML IJ SOLN
INTRAMUSCULAR | Status: DC | PRN
  Administered 2021-10-22 (×2): 100 ug via INTRAVENOUS
  Administered 2021-10-22: 50 ug via INTRAVENOUS

## 2021-10-22 MED ORDER — STERILE WATER FOR INJECTION IJ SOLN
INTRAMUSCULAR | Status: AC
  Filled 2021-10-22: qty 10

## 2021-10-22 MED ORDER — CEFAZOLIN SODIUM-DEXTROSE 2-4 GM/100ML-% IV SOLN
2.0000 g | INTRAVENOUS | Status: AC
  Administered 2021-10-22: 2 g via INTRAVENOUS
  Filled 2021-10-22: qty 100

## 2021-10-22 MED ORDER — ROCURONIUM BROMIDE 10 MG/ML (PF) SYRINGE
PREFILLED_SYRINGE | INTRAVENOUS | Status: AC
  Filled 2021-10-22: qty 10

## 2021-10-22 MED ORDER — PROPOFOL 10 MG/ML IV BOLUS
INTRAVENOUS | Status: DC | PRN
  Administered 2021-10-22: 150 mg via INTRAVENOUS

## 2021-10-22 MED ORDER — BUPIVACAINE HCL 0.25 % IJ SOLN
INTRAMUSCULAR | Status: DC | PRN
  Administered 2021-10-22: 35 mL

## 2021-10-22 SURGICAL SUPPLY — 78 items
ADH SKN CLS APL DERMABOND .7 (GAUZE/BANDAGES/DRESSINGS) ×2
AGENT HMST KT MTR STRL THRMB (HEMOSTASIS)
APL ESCP 34 STRL LF DISP (HEMOSTASIS)
APPLICATOR SURGIFLO ENDO (HEMOSTASIS) IMPLANT
BACTOSHIELD CHG 4% 4OZ (MISCELLANEOUS) ×1
BAG LAPAROSCOPIC 12 15 PORT 16 (BASKET) IMPLANT
BAG RETRIEVAL 10 (BASKET)
BAG RETRIEVAL 12/15 (BASKET)
BLADE SURG SZ10 CARB STEEL (BLADE) IMPLANT
COVER BACK TABLE 60X90IN (DRAPES) ×4 IMPLANT
COVER TIP SHEARS 8 DVNC (MISCELLANEOUS) ×3 IMPLANT
COVER TIP SHEARS 8MM DA VINCI (MISCELLANEOUS) ×3
DERMABOND ADVANCED (GAUZE/BANDAGES/DRESSINGS) ×1
DERMABOND ADVANCED .7 DNX12 (GAUZE/BANDAGES/DRESSINGS) ×3 IMPLANT
DRAPE ARM DVNC X/XI (DISPOSABLE) ×12 IMPLANT
DRAPE COLUMN DVNC XI (DISPOSABLE) ×3 IMPLANT
DRAPE DA VINCI XI ARM (DISPOSABLE) ×12
DRAPE DA VINCI XI COLUMN (DISPOSABLE) ×3
DRAPE SHEET LG 3/4 BI-LAMINATE (DRAPES) ×4 IMPLANT
DRAPE SURG IRRIG POUCH 19X23 (DRAPES) ×4 IMPLANT
DRSG OPSITE POSTOP 4X6 (GAUZE/BANDAGES/DRESSINGS) IMPLANT
DRSG OPSITE POSTOP 4X8 (GAUZE/BANDAGES/DRESSINGS) IMPLANT
DRSG TEGADERM 6X8 (GAUZE/BANDAGES/DRESSINGS) ×1 IMPLANT
ELECT PENCIL ROCKER SW 15FT (MISCELLANEOUS) IMPLANT
ELECT REM PT RETURN 15FT ADLT (MISCELLANEOUS) ×4 IMPLANT
GAUZE 4X4 16PLY ~~LOC~~+RFID DBL (SPONGE) ×4 IMPLANT
GLOVE BIO SURGEON STRL SZ 6 (GLOVE) ×16 IMPLANT
GLOVE BIO SURGEON STRL SZ 6.5 (GLOVE) ×8 IMPLANT
GOWN STRL REUS W/ TWL LRG LVL3 (GOWN DISPOSABLE) ×12 IMPLANT
GOWN STRL REUS W/TWL LRG LVL3 (GOWN DISPOSABLE) ×12
HOLDER FOLEY CATH W/STRAP (MISCELLANEOUS) IMPLANT
IRRIG SUCT STRYKERFLOW 2 WTIP (MISCELLANEOUS) ×3
IRRIGATION SUCT STRKRFLW 2 WTP (MISCELLANEOUS) ×3 IMPLANT
IV LACTATED RINGERS 1000ML (IV SOLUTION) ×1 IMPLANT
KIT PROCEDURE DA VINCI SI (MISCELLANEOUS)
KIT PROCEDURE DVNC SI (MISCELLANEOUS) IMPLANT
KIT TURNOVER KIT A (KITS) ×1 IMPLANT
LIGASURE IMPACT 36 18CM CVD LR (INSTRUMENTS) IMPLANT
MANIPULATOR ADVINCU DEL 3.0 PL (MISCELLANEOUS) ×1 IMPLANT
MANIPULATOR ADVINCU DEL 3.5 PL (MISCELLANEOUS) IMPLANT
MANIPULATOR UTERINE 4.5 ZUMI (MISCELLANEOUS) IMPLANT
NDL HYPO 21X1.5 SAFETY (NEEDLE) ×3 IMPLANT
NDL SPNL 18GX3.5 QUINCKE PK (NEEDLE) IMPLANT
NEEDLE HYPO 21X1.5 SAFETY (NEEDLE) ×3 IMPLANT
NEEDLE SPNL 18GX3.5 QUINCKE PK (NEEDLE) ×3 IMPLANT
OBTURATOR OPTICAL STANDARD 8MM (TROCAR) ×3
OBTURATOR OPTICAL STND 8 DVNC (TROCAR) ×2
OBTURATOR OPTICALSTD 8 DVNC (TROCAR) ×3 IMPLANT
PACK ROBOT GYN CUSTOM WL (TRAY / TRAY PROCEDURE) ×4 IMPLANT
PAD POSITIONING PINK XL (MISCELLANEOUS) ×4 IMPLANT
PORT ACCESS TROCAR AIRSEAL 12 (TROCAR) ×3 IMPLANT
PORT ACCESS TROCAR AIRSEAL 5M (TROCAR) ×1
SCRUB CHG 4% DYNA-HEX 4OZ (MISCELLANEOUS) ×3 IMPLANT
SEAL CANN UNIV 5-8 DVNC XI (MISCELLANEOUS) ×12 IMPLANT
SEAL XI 5MM-8MM UNIVERSAL (MISCELLANEOUS) ×12
SET TRI-LUMEN FLTR TB AIRSEAL (TUBING) ×4 IMPLANT
SPIKE FLUID TRANSFER (MISCELLANEOUS) ×5 IMPLANT
SPONGE T-LAP 18X18 ~~LOC~~+RFID (SPONGE) IMPLANT
SURGIFLO W/THROMBIN 8M KIT (HEMOSTASIS) IMPLANT
SUT MNCRL AB 4-0 PS2 18 (SUTURE) IMPLANT
SUT PDS AB 1 TP1 96 (SUTURE) IMPLANT
SUT VIC AB 0 CT1 27 (SUTURE)
SUT VIC AB 0 CT1 27XBRD ANTBC (SUTURE) IMPLANT
SUT VIC AB 2-0 CT1 27 (SUTURE)
SUT VIC AB 2-0 CT1 TAPERPNT 27 (SUTURE) IMPLANT
SUT VIC AB 4-0 PS2 18 (SUTURE) ×8 IMPLANT
SYR 10ML LL (SYRINGE) IMPLANT
SYS BAG RETRIEVAL 10MM (BASKET)
SYS WOUND ALEXIS 18CM MED (MISCELLANEOUS)
SYSTEM BAG RETRIEVAL 10MM (BASKET) IMPLANT
SYSTEM WOUND ALEXIS 18CM MED (MISCELLANEOUS) IMPLANT
TOWEL OR NON WOVEN STRL DISP B (DISPOSABLE) IMPLANT
TRAP SPECIMEN MUCUS 40CC (MISCELLANEOUS) IMPLANT
TRAY FOLEY MTR SLVR 16FR STAT (SET/KITS/TRAYS/PACK) ×4 IMPLANT
TROCAR Z-THREAD FIOS 5X100MM (TROCAR) IMPLANT
UNDERPAD 30X36 HEAVY ABSORB (UNDERPADS AND DIAPERS) ×8 IMPLANT
WATER STERILE IRR 1000ML POUR (IV SOLUTION) ×4 IMPLANT
YANKAUER SUCT BULB TIP 10FT TU (MISCELLANEOUS) IMPLANT

## 2021-10-22 NOTE — Transfer of Care (Signed)
Immediate Anesthesia Transfer of Care Note  Patient: Kylie Davis  Procedure(s) Performed: XI ROBOTIC ASSISTED TOTAL HYSTERECTOMY WITH BILATERAL SALPINGO OOPHORECTOMY possible,LAPAROTOMY (Bilateral: Pelvis) Sentinel LYMPH NODE BIOPSY with possible lymph node dissection (Pelvis) INDOCYANINE GREEN FLUORESCENCE IMAGING (ICG) (Pelvis)  Patient Location: PACU  Anesthesia Type:General  Level of Consciousness: awake, alert , oriented and patient cooperative  Airway & Oxygen Therapy: Patient Spontanous Breathing and Patient connected to face mask oxygen  Post-op Assessment: Report given to RN, Post -op Vital signs reviewed and stable and Patient moving all extremities X 4  Post vital signs: Reviewed and stable  Last Vitals:  Vitals Value Taken Time  BP 142/86 10/22/21 1045  Temp    Pulse 66 10/22/21 1047  Resp 14 10/22/21 1047  SpO2 100 % 10/22/21 1047  Vitals shown include unvalidated device data.  Last Pain:  Vitals:   10/22/21 0810  TempSrc:   PainSc: 0-No pain      Patients Stated Pain Goal: 3 (02/22/11 1975)  Complications: No notable events documented.

## 2021-10-22 NOTE — Interval H&P Note (Signed)
History and Physical Interval Note:  10/22/2021 6:49 AM  Kylie Davis  has presented today for surgery, with the diagnosis of endometrial ca.  The various methods of treatment have been discussed with the patient and family. After consideration of risks, benefits and other options for treatment, the patient has consented to  Procedure(s): XI ROBOTIC ASSISTED TOTAL HYSTERECTOMY WITH BILATERAL SALPINGO OOPHORECTOMY possible,LAPAROTOMY (Bilateral) Sentinel LYMPH NODE BIOPSY with possible lymph node dissection (N/A) INDOCYANINE GREEN FLUORESCENCE IMAGING (ICG) (N/A) as a surgical intervention.  The patient's history has been reviewed, patient examined, no change in status, stable for surgery.  I have reviewed the patient's chart and labs.  Questions were answered to the patient's satisfaction.     [name]

## 2021-10-22 NOTE — Anesthesia Postprocedure Evaluation (Signed)
Anesthesia Post Note  Patient: Kylie Davis  Procedure(s) Performed: XI ROBOTIC ASSISTED TOTAL HYSTERECTOMY WITH BILATERAL SALPINGO OOPHORECTOMY (Bilateral: Pelvis) Sentinel LYMPH NODE BIOPSY (Pelvis) INDOCYANINE GREEN FLUORESCENCE IMAGING (ICG) (Pelvis)     Patient location during evaluation: PACU Anesthesia Type: General Level of consciousness: awake and alert Pain management: pain level controlled Vital Signs Assessment: post-procedure vital signs reviewed and stable Respiratory status: spontaneous breathing, nonlabored ventilation, respiratory function stable and patient connected to nasal cannula oxygen Cardiovascular status: blood pressure returned to baseline and stable Postop Assessment: no apparent nausea or vomiting Anesthetic complications: no   No notable events documented.  Last Vitals:  Vitals:   10/22/21 1115 10/22/21 1145  BP: 136/71 (!) 134/50  Pulse: 61 (!) 59  Resp: 11   Temp: 36.8 C   SpO2: 97% 95%    Last Pain:  Vitals:   10/22/21 1145  TempSrc:   PainSc: 0-No pain                 Barnet Glasgow

## 2021-10-22 NOTE — Anesthesia Procedure Notes (Signed)
Procedure Name: Intubation Date/Time: 10/22/2021 8:34 AM Performed by: Jonna Munro, CRNA Pre-anesthesia Checklist: Patient identified, Emergency Drugs available, Suction available, Patient being monitored and Timeout performed Patient Re-evaluated:Patient Re-evaluated prior to induction Oxygen Delivery Method: Circle system utilized Preoxygenation: Pre-oxygenation with 100% oxygen Induction Type: IV induction Ventilation: Mask ventilation without difficulty Laryngoscope Size: Mac and 3 Grade View: Grade I Tube type: Oral Tube size: 7.0 mm Number of attempts: 1 Airway Equipment and Method: Stylet Placement Confirmation: positive ETCO2, ETT inserted through vocal cords under direct vision and breath sounds checked- equal and bilateral Secured at: 23 cm Tube secured with: Tape Dental Injury: Teeth and Oropharynx as per pre-operative assessment

## 2021-10-22 NOTE — Discharge Instructions (Signed)
AFTER SURGERY INSTRUCTIONS   Return to work: 4-6 weeks if applicable   Activity: 1. Be up and out of the bed during the day.  Take a nap if needed.  You may walk up steps but be careful and use the hand rail.  Stair climbing will tire you more than you think, you may need to stop part way and rest.    2. No lifting or straining for 6 weeks over 10 pounds. No pushing, pulling, straining for 6 weeks.   3. No driving for around 1 week(s).  Do not drive if you are taking narcotic pain medicine and make sure that your reaction time has returned.    4. You can shower as soon as the next day after surgery. Shower daily.  Use your regular soap and water (not directly on the incision) and pat your incision(s) dry afterwards; don't rub.  No tub baths or submerging your body in water until cleared by your surgeon. If you have the soap that was given to you by pre-surgical testing that was used before surgery, you do not need to use it afterwards because this can irritate your incisions.    5. No sexual activity and nothing in the vagina for 8 weeks.   6. You may experience a small amount of clear drainage from your incisions, which is normal.  If the drainage persists, increases, or changes color please call the office.   7. Do not use creams, lotions, or ointments such as neosporin on your incisions after surgery until advised by your surgeon because they can cause removal of the dermabond glue on your incisions.     8. You may experience vaginal spotting after surgery or around the 6-8 week mark from surgery when the stitches at the top of the vagina begin to dissolve.  The spotting is normal but if you experience heavy bleeding, call our office.   9. Take Tylenol or ibuprofen first for pain and only use narcotic pain medication for severe pain not relieved by the Tylenol or Ibuprofen.  Monitor your Tylenol intake to a max of 4,000 mg in a 24 hour period. You can alternate these medications after  surgery.   Diet: 1. Low sodium Heart Healthy Diet is recommended but you are cleared to resume your normal (before surgery) diet after your procedure.   2. It is safe to use a laxative, such as Miralax or Colace, if you have difficulty moving your bowels. You have been prescribed Sennakot-S to take at bedtime every evening after surgery to keep bowel movements regular and to prevent constipation.     Wound Care: 1. Keep clean and dry.  Shower daily.   Reasons to call the Doctor: Fever - Oral temperature greater than 100.4 degrees Fahrenheit Foul-smelling vaginal discharge Difficulty urinating Nausea and vomiting Increased pain at the site of the incision that is unrelieved with pain medicine. Difficulty breathing with or without chest pain New calf pain especially if only on one side Sudden, continuing increased vaginal bleeding with or without clots.   Contacts: For questions or concerns you should contact:   Dr. Jeral Pinch at [phone]   Joylene John, NP at [phone]   After Hours: call [phone] and have the GYN Oncologist paged/contacted (after 5 pm or on the weekends).   Messages sent via mychart are for non-urgent matters and are not responded to after hours so for urgent needs, please call the after hours number.

## 2021-10-22 NOTE — Op Note (Signed)
OPERATIVE NOTE  Pre-operative Diagnosis: endometrial cancer grade 1  Post-operative Diagnosis: same  Operation: Robotic-assisted laparoscopic total hysterectomy with bilateral salpingoophorectomy, SLN biopsy bilaterally, lysis of adhesions (approximately 10 minutes)   Surgeon: Jeral Pinch MD  Assistant Surgeon: Lahoma Crocker MD (an MD assistant was necessary for tissue manipulation, management of robotic instrumentation, retraction and positioning due to the complexity of the case and hospital policies).   Anesthesia: GET  Urine Output: 100cc  Operative Findings: On EUA, small mobile uterus. On intra-abdominal entry, normal upper abdominal survey. Some adhesions of the omentum to the left abdominal side wall in the upper abdomen. Some adhesions of the ascending colon to the right abdominal side wall and of the cecum to the right IP ligament. 8 cm uterus, normal in appearance. Normal appearing. Bilateral adnexa. No gross adenopathy. Mapping successful to bilateral obturator SLNs. Sigmoid colon adherent to the left IP ligament and left pelvic sidewall.   Estimated Blood Loss:  50 cc      Total IV Fluids: see I&O flowsheet         Specimens: uterus, cervix, bilateral tubes and ovaries, bilateral obturator SLNs         Complications:  None apparent; patient tolerated the procedure well.         Disposition: PACU - hemodynamically stable.  Procedure Details  The patient was seen in the Holding Room. The risks, benefits, complications, treatment options, and expected outcomes were discussed with the patient.  The patient concurred with the proposed plan, giving informed consent.  The site of surgery properly noted/marked. The patient was identified as Kylie Davis and the procedure verified as a Robotic-assisted hysterectomy with bilateral salpingo oophorectomy with SLN biopsy.   After induction of anesthesia, the patient was draped and prepped in the usual sterile manner.  Patient was placed in supine position after anesthesia and draped and prepped in the usual sterile manner as follows: Her arms were tucked to her side with all appropriate precautions.  The shoulders were stabilized with padded shoulder blocks applied to the acromium processes.  The patient was placed in the semi-lithotomy position in [place].  The perineum and vagina were prepped with CholoraPrep. The patient was draped after the CholoraPrep had been allowed to dry for 3 minutes.  A Time Out was held and the above information confirmed.  The urethra was prepped with Betadine. Foley catheter was placed.  A sterile speculum was placed in the vagina.  The cervix was grasped with a single-tooth tenaculum. '2mg'$  total of ICG was injected into the cervical stroma at 2 and 9 o'clock with 1cc injected at a 1cm and 75m depth (concentration 0.'5mg'$ /ml) in all locations. The cervix was dilated with PKennon Roundsdilators.  The 3.0 Delineator uterine manipulator with a colpotomizer ring was placed without difficulty.  A pneum occluder balloon was placed over the manipulator.  OG tube placement was confirmed and to suction.   Next, a 10 mm skin incision was made 1 cm below the subcostal margin in the midclavicular line.  The 5 mm Optiview port and scope was used for direct entry.  Opening pressure was under 10 mm CO2.  The abdomen was insufflated and the findings were noted as above.   At this point and all points during the procedure, the patient's intra-abdominal pressure did not exceed 15 mmHg. Next, an 8 mm skin incision was made superior to the umbilicus and a right and left port were placed about 8 cm lateral to the robot port  on the right and left side.  A fourth arm was placed on the right.  The 5 mm assist trocar was exchanged for a 10-12 mm port. All ports were placed under direct visualization.  The patient was placed in steep Trendelenburg.  The robot was docked in the normal manner.  Attention was first turn to  the colon. Sharp dissection was used to lyse adhesions of the ascending colon to the right abdominal wall and of the cecum to the IP ligament.   The right and left peritoneum were opened parallel to the IP ligament to open the retroperitoneal spaces bilaterally. The round ligaments were transected. The SLN mapping was performed in bilateral pelvic basins. After identifying the ureters, the para rectal and paravesical spaces were opened up entirely with careful dissection below the level of the ureters bilaterally and to the depth of the uterine artery origin in order to skeletonize the uterine "web" and ensure visualization of all parametrial channels. The para-aortic basins were carefully exposed and evaluated for isolated para-aortic SLN's. Lymphatic channels were identified travelling to the following visualized sentinel lymph node's: bilateral obturator SLNs. These SLN's were separated from their surrounding lymphatic tissue, removed and sent for permanent pathology.  On the left, sharp and blunt dissection were used to lyse adhesions between the sigmoid colon and the left pelvic sidewall and IP ligament.  The hysterectomy was started.  The ureter was again noted to be on the medial leaf of the broad ligament.  The peritoneum above the ureter was incised and stretched and the infundibulopelvic ligament was skeletonized, cauterized and cut.  The posterior peritoneum was taken down to the level of the KOH ring.  The anterior peritoneum was also taken down.  The bladder flap was created to the level of the KOH ring.  The uterine artery on the right side was skeletonized, cauterized and cut in the normal manner.  A similar procedure was performed on the left.  The colpotomy was made and the uterus, cervix, bilateral ovaries and tubes were amputated and delivered through the vagina.  Pedicles were inspected and excellent hemostasis was achieved.    The colpotomy at the vaginal cuff was closed with Vicryl on a  CT1 needle in a running manner.  Irrigation was used and excellent hemostasis was achieved.  At this point in the procedure was completed.  Robotic instruments were removed under direct visulaization.  The robot was undocked. The deep subcutaneous tissue at the 10-12 mm port was closed with 0 Vicryl on a UR-5 needle.  The subcuticular tissue was closed with 4-0 Vicryl and the skin was closed with 4-0 Monocryl in a subcuticular manner.  Dermabond was applied.    The vagina was swabbed with  minimal bleeding noted. Foley catheter was removed.  All sponge, lap and needle counts were correct x 3.   The patient was transferred to the recovery room in stable condition.  Jeral Pinch, MD

## 2021-10-23 ENCOUNTER — Telehealth: Payer: Self-pay | Admitting: *Deleted

## 2021-10-23 ENCOUNTER — Encounter (HOSPITAL_COMMUNITY): Payer: Self-pay | Admitting: Gynecologic Oncology

## 2021-10-23 NOTE — Telephone Encounter (Signed)
Spoke with Ms. Bisping this morning. She states she is eating, drinking and urinating well. She has not had a BM yet but is passing gas. She is taking senokot as prescribed and encouraged her to drink plenty of water. She denies fever or chills. Incisions are dry and intact. She rates her pain 2/10. Her pain is controlled with Tylenol.     Instructed to call office with any fever, chills, purulent drainage, uncontrolled pain or any other questions or concerns. Patient verbalizes understanding.   Pt aware of post op appointments as well as the office number [phone] and after hours number [phone] to call if she has any questions or concerns

## 2021-10-24 ENCOUNTER — Other Ambulatory Visit: Payer: Self-pay | Admitting: Adult Health

## 2021-10-24 ENCOUNTER — Telehealth: Payer: Self-pay | Admitting: *Deleted

## 2021-10-24 ENCOUNTER — Encounter: Payer: Self-pay | Admitting: Adult Health

## 2021-10-24 MED ORDER — FREESTYLE LIBRE 3 SENSOR MISC: 1.0000 | 6 refills | Status: DC

## 2021-10-24 NOTE — Telephone Encounter (Signed)
Per patient request rescheduled her appt from 6/30 to 7/7

## 2021-10-28 ENCOUNTER — Telehealth: Payer: Self-pay

## 2021-10-28 NOTE — Telephone Encounter (Signed)
Received call from [name] this morning. Patient inquiring if it is ok to resume her trulicity and baby aspirin. Per Joylene John, NP it is ok to resume both medications. If patient starts to notice any type of bleeding do not take the aspirin. Patient verbalized understanding and instructed to call with any needs.   Patient had several questions about her pathology report. Advised patient that Dr. Berline Lopes will review the report with her during her phone visit tomorrow 10/29/21. Patient verbalized understanding.

## 2021-10-29 ENCOUNTER — Encounter: Payer: Self-pay | Admitting: Gynecologic Oncology

## 2021-10-29 ENCOUNTER — Inpatient Hospital Stay: Payer: Medicare PPO | Attending: Gynecologic Oncology | Admitting: Gynecologic Oncology

## 2021-10-29 DIAGNOSIS — Z90722 Acquired absence of ovaries, bilateral: Secondary | ICD-10-CM

## 2021-10-29 DIAGNOSIS — Z90710 Acquired absence of both cervix and uterus: Secondary | ICD-10-CM

## 2021-10-29 DIAGNOSIS — Z7189 Other specified counseling: Secondary | ICD-10-CM

## 2021-10-29 DIAGNOSIS — C541 Malignant neoplasm of endometrium: Secondary | ICD-10-CM

## 2021-10-29 NOTE — Progress Notes (Signed)
Gynecologic Oncology Telehealth Note: Gyn-Onc  I connected with Kylie Davis on 10/30/21 at  4:55 PM EDT by telephone and verified that I am speaking with the correct person using two identifiers.  I discussed the limitations, risks, security and privacy concerns of performing an evaluation and management service by telemedicine and the availability of in-person appointments. I also discussed with the patient that there may be a patient responsible charge related to this service. The patient expressed understanding and agreed to proceed.  Other persons participating in the visit and their role in the encounter: none.  Patient's location: home Provider's location: [facility]  Reason for Visit: follow-up after surgery, treatment discussion  Treatment History: Oncology History  Endometrial cancer ([place])  09/25/2021 Initial Biopsy   EMB - grade 1 endometrioid adenocarcinoma, MMR IHC intact   10/06/2021 Initial Diagnosis   Endometrial cancer ([place])   10/22/2021 Surgery   TRH/BSO, SLN bilaterally, LOA  Findings: On EUA, small mobile uterus. On intra-abdominal entry, normal upper abdominal survey. Some adhesions of the omentum to the left abdominal side wall in the upper abdomen. Some adhesions of the ascending colon to the right abdominal side wall and of the cecum to the right IP ligament. 8 cm uterus, normal in appearance. Normal appearing. Bilateral adnexa. No gross adenopathy. Mapping successful to bilateral obturator SLNs. Sigmoid colon adherent to the left IP ligament and left pelvic sidewall.   10/22/2021 Pathology Results   Stage IB, grade 1 MI 63% (difficult to determine given adenomyosis) LVI negative SLN on right negative, no definitive SLN seen in tissue excised on left (lymphoid cells negative for malignancy)     Interval History: Doing very well. Has only needed Tylenol for pain. Bowel function improving. Denies urinary symptoms. Denies any vaginal bleeding or discharge.    Past Medical/Surgical History: Past Medical History:  Diagnosis Date   Arthritis    BMI 39.0-39.9,adult    Diabetes ([place])    on Trulicity   Hypertension    PONV (postoperative nausea and vomiting)    Sleep apnea    C PAP    Past Surgical History:  Procedure Laterality Date   CHOLECYSTECTOMY  2004   LYMPH NODE BIOPSY N/A 10/22/2021   Procedure: Sentinel LYMPH NODE BIOPSY;  Surgeon: [name], MD;  Location: [facility];  Service: Gynecology;  Laterality: N/A;   ROBOTIC ASSISTED TOTAL HYSTERECTOMY WITH BILATERAL SALPINGO OOPHERECTOMY Bilateral 10/22/2021   Procedure: XI ROBOTIC ASSISTED TOTAL HYSTERECTOMY WITH BILATERAL SALPINGO OOPHORECTOMY;  Surgeon: [name], MD;  Location: [facility];  Service: Gynecology;  Laterality: Bilateral;    Family History  Problem Relation Age of Onset   Cancer Mother 33       primary peritoneal cancer   Alzheimer's disease Mother    Cancer Father 55       stomach or pancreatic   Heart disease Father    Hypertension Brother    Heart attack Brother 59       MI x 2   Breast cancer Paternal Aunt 70   Diabetes Paternal Grandmother    Colon cancer Neg Hx    Prostate cancer Neg Hx    Ovarian cancer Neg Hx     Social History   Socioeconomic History   Marital status: Married    Spouse name: Not on file   Number of children: Not on file   Years of education: Not on file   Highest education level: Not on file  Occupational History   Occupation: retired Pharmacist, hospital  Employer: RETIRED  Tobacco Use   Smoking status: Never   Smokeless tobacco: Never  Vaping Use   Vaping Use: Never used  Substance and Sexual Activity   Alcohol use: Yes    Alcohol/week: 4.0 standard drinks of alcohol    Types: 4 Standard drinks or equivalent per week    Comment: occas   Drug use: No   Sexual activity: Not Currently    Birth control/protection: Post-menopausal    Comment: 1st intercourse 71 yo.---Fewer than 5 partners  Other Topics Concern   Not  on file  Social History Narrative   Retired from being a Education officer, museum.    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 years    0 children    Social Determinants of Health   Financial Resource Strain: Low Risk  (12/18/2020)   Overall Financial Resource Strain (CARDIA)    Difficulty of Paying Living Expenses: Not hard at all  Food Insecurity: No Food Insecurity (12/25/2019)   Hunger Vital Sign    Worried About Running Out of Food in the Last Year: Never true    [place] in the Last Year: Never true  Transportation Needs: No Transportation Needs (12/18/2020)   PRAPARE - Hydrologist (Medical): No    Lack of Transportation (Non-Medical): No  Physical Activity: Sufficiently Active (12/18/2020)   Exercise Vital Sign    Days of Exercise per Week: 4 days    Minutes of Exercise per Session: 40 min  Stress: No Stress Concern Present (12/25/2019)   [place]    Feeling of Stress : Not at all  Social Connections: [place] (12/18/2020)   Social Connection and Isolation Panel [NHANES]    Frequency of Communication with Friends and Family: Three times a week    Frequency of Social Gatherings with Friends and Family: Three times a week    Attends Religious Services: More than 4 times per year    Active Member of Clubs or Organizations: Yes    Attends Archivist Meetings: More than 4 times per year    Marital Status: Married    Current Medications:  Current Outpatient Medications:    acetaminophen (TYLENOL) 500 MG tablet, Take 500-1,000 mg by mouth every 6 (six) hours as needed (pain.)., Disp: , Rfl:    aspirin EC 81 MG tablet, Take 81 mg by mouth in the morning., Disp: , Rfl:    Cholecalciferol (VITAMIN D3 PO), Take 1 tablet by mouth 2 (two) times a week., Disp: , Rfl:    Continuous Blood Gluc Sensor (FREESTYLE LIBRE 3 SENSOR) MISC, 1 Device by Does not apply route every 14 (fourteen) days. Place 1 sensor on  the skin every 14 days. Use to check glucose continuously, Disp: 2 each, Rfl: 6   ibuprofen (ADVIL) 200 MG tablet, Take 400 mg by mouth every 8 (eight) hours as needed (for pain.)., Disp: , Rfl:    ibuprofen (ADVIL) 600 MG tablet, Take 1 tablet (600 mg total) by mouth every 6 (six) hours as needed for moderate pain. For AFTER surgery only, Disp: 30 tablet, Rfl: 0   losartan-hydrochlorothiazide (HYZAAR) 50-12.5 MG tablet, TAKE 1 TABLET BY MOUTH DAILY, Disp: 90 tablet, Rfl: 2   senna-docusate (SENOKOT-S) 8.6-50 MG tablet, Take 2 tablets by mouth at bedtime. For AFTER surgery, do not take if having diarrhea, Disp: 30 tablet, Rfl: 0   traMADol (ULTRAM) 50 MG tablet, Take 1 tablet (50 mg total) by mouth  every 6 (six) hours as needed for severe pain. For AFTER surgery only, do not take and drive, Disp: 10 tablet, Rfl: 0   TRULICITY 1.16 FB/9.0XY SOPN, ADMINISTER 0.75 MG UNDER THE SKIN 1 TIME A WEEK (Patient taking differently: Inject 0.75 mg into the skin every Wednesday.), Disp: 6 mL, Rfl: 1  Review of Symptoms: Pertinent positives as per HPI.  Physical Exam: There were no vitals taken for this visit. Deferred given limitations of phone visit.  Laboratory & Radiologic Studies: See above  Assessment & Plan: Kylie Davis is a 71 y.o. 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  The patient is doing well and meeting postoperative milestones. Discussed continued expectations and restrictions.   Reviewed pathology in depth with her. DOI estimation appears somewhat challenging given adenomyosis. Right SLN negative, no true lymph node appreciate from left biopsy. Given her high-intermediate risk features (outer half invasion in the setting of age >46), she would benefit from adjuvant treatment to decrease her risk of recurrence.   I discussed the lack of true lymph node assessment on the left based on final pathology results.  By older data, in a grade 1 tumor with middle 1/3 invasion, her risk of pelvic lymph node involvement would be 0% and her risk of PA node involvement would be 5%. By Mayo criteria used (presuming uterus sent during surgery), then risk of pelvic nodal involvement would be estimated to be >5%.  I discussed with the patient and her husband that I will clarify with pathology findings from left SLN biopsy. Will await MSI testing and p53.   Treatment options would include: Robotic left pelvic lymphadenectomy (risk of isolated PA lymph node metastasis in early stage endometrial cancer is <2%). If confirmed negative lymph nodes, then would plan for VBT as adjuvant treatment.  Patient could be treated with pelvic RT based on PORTEC1. Plan for CT scan with decision about second surgery for lymph node excision based on results. If normal appearing lymph nodes, then making a decision regarding adjuvant therapy with VBT only versus consideration of pelvic RT.    I will call the patient after our tumor board to discuss next steps.   I discussed the assessment and treatment plan with the patient. The patient was provided with an opportunity to ask questions and all were answered. The patient agreed with the plan and demonstrated an understanding of the instructions.   The patient was advised to call back or see an in-person evaluation if the symptoms worsen or if the condition fails to improve as anticipated.   24 minutes of total time was spent for this patient encounter, including preparation, phone counseling with the patient and coordination of care, and documentation of the encounter.   Jeral Pinch, MD  Division of Gynecologic Oncology  Department of Obstetrics and Gynecology  [facility]

## 2021-10-30 ENCOUNTER — Telehealth: Payer: Self-pay

## 2021-10-30 NOTE — Telephone Encounter (Signed)
Reached out to [facility], per Dr. Berline Lopes ensure MSI is sent and perform p53 IHC. Suanne Marker verbalized understanding and ensured it will be taken care of.

## 2021-11-04 ENCOUNTER — Telehealth: Payer: Self-pay | Admitting: *Deleted

## 2021-11-04 ENCOUNTER — Telehealth: Payer: Self-pay

## 2021-11-04 NOTE — Telephone Encounter (Signed)
Error

## 2021-11-04 NOTE — Telephone Encounter (Signed)
Pt aware cancer forms for insurance have been filled out and ready for pick up.   Pt asked if there was a nutrition class here at [facility] we could send her to.  Referral sent to the dietitian here at [facility].

## 2021-11-05 ENCOUNTER — Telehealth: Payer: Medicare PPO | Admitting: Gynecologic Oncology

## 2021-11-06 ENCOUNTER — Telehealth: Payer: Self-pay

## 2021-11-06 NOTE — Telephone Encounter (Signed)
.  Called patient to schedule appointment per 6/19 inbasket, patient is aware of date and time.

## 2021-11-10 ENCOUNTER — Telehealth: Payer: Self-pay | Admitting: Gynecologic Oncology

## 2021-11-10 ENCOUNTER — Other Ambulatory Visit: Payer: Self-pay

## 2021-11-10 ENCOUNTER — Inpatient Hospital Stay: Payer: Medicare PPO

## 2021-11-10 ENCOUNTER — Other Ambulatory Visit: Payer: Self-pay | Admitting: Oncology

## 2021-11-10 DIAGNOSIS — C541 Malignant neoplasm of endometrium: Secondary | ICD-10-CM

## 2021-11-10 NOTE — Telephone Encounter (Signed)
Called the patient, discussed recommendations from tumor board.   P53 wildtype, MS stable. Plan for CT scan, we will try to coordinate this on the seventh when she comes to see me.  If CT scan is negative for any evidence of metastatic disease (specifically adenopathy), we will plan to proceed with vaginal brachytherapy.  If CT scan raises concern for possible lymph node involvement, then we will discuss surgery for lymph node assessment.  Eugene Garnet MD Gynecologic Oncology

## 2021-11-11 ENCOUNTER — Telehealth: Payer: Self-pay

## 2021-11-12 ENCOUNTER — Ambulatory Visit
Admission: RE | Admit: 2021-11-12 | Discharge: 2021-11-12 | Disposition: A | Discharge status: Home / Self Care | Payer: Medicare PPO | Source: Ambulatory Visit | Attending: Adult Health | Admitting: Adult Health

## 2021-11-12 ENCOUNTER — Ambulatory Visit: Payer: Medicare PPO

## 2021-11-12 DIAGNOSIS — N6324 Unspecified lump in the left breast, lower inner quadrant: Secondary | ICD-10-CM | POA: Diagnosis not present

## 2021-11-12 DIAGNOSIS — R928 Other abnormal and inconclusive findings on diagnostic imaging of breast: Secondary | ICD-10-CM

## 2021-11-14 ENCOUNTER — Encounter: Payer: Medicare PPO | Admitting: Gynecologic Oncology

## 2021-11-14 DIAGNOSIS — L237 Allergic contact dermatitis due to plants, except food: Secondary | ICD-10-CM | POA: Diagnosis not present

## 2021-11-19 ENCOUNTER — Encounter: Payer: Self-pay | Admitting: Gynecologic Oncology

## 2021-11-21 ENCOUNTER — Encounter: Payer: Self-pay | Admitting: Gynecologic Oncology

## 2021-11-21 ENCOUNTER — Ambulatory Visit (HOSPITAL_COMMUNITY)
Admission: RE | Admit: 2021-11-21 | Discharge: 2021-11-21 | Disposition: A | Discharge status: Home / Self Care | Payer: Medicare PPO | Source: Ambulatory Visit | Attending: Gynecologic Oncology | Admitting: Gynecologic Oncology

## 2021-11-21 ENCOUNTER — Encounter: Payer: Medicare PPO | Admitting: Gynecologic Oncology

## 2021-11-21 ENCOUNTER — Other Ambulatory Visit: Payer: Self-pay

## 2021-11-21 ENCOUNTER — Inpatient Hospital Stay: Payer: Medicare PPO | Attending: Gynecologic Oncology | Admitting: Gynecologic Oncology

## 2021-11-21 VITALS — BP 136/70 | HR 88 | Temp 98.1°F | Wt 229.0 lb

## 2021-11-21 DIAGNOSIS — Z90722 Acquired absence of ovaries, bilateral: Secondary | ICD-10-CM

## 2021-11-21 DIAGNOSIS — C541 Malignant neoplasm of endometrium: Secondary | ICD-10-CM

## 2021-11-21 DIAGNOSIS — Z90710 Acquired absence of both cervix and uterus: Secondary | ICD-10-CM

## 2021-11-21 DIAGNOSIS — Z7189 Other specified counseling: Secondary | ICD-10-CM

## 2021-11-21 DIAGNOSIS — C539 Malignant neoplasm of cervix uteri, unspecified: Secondary | ICD-10-CM | POA: Diagnosis not present

## 2021-11-21 DIAGNOSIS — K5730 Diverticulosis of large intestine without perforation or abscess without bleeding: Secondary | ICD-10-CM | POA: Diagnosis not present

## 2021-11-21 DIAGNOSIS — K7689 Other specified diseases of liver: Secondary | ICD-10-CM | POA: Diagnosis not present

## 2021-11-21 MED ORDER — SODIUM CHLORIDE (PF) 0.9 % IJ SOLN
INTRAMUSCULAR | Status: AC
  Filled 2021-11-21: qty 50

## 2021-11-21 MED ORDER — IOHEXOL 300 MG/ML  SOLN
100.0000 mL | Freq: Once | INTRAMUSCULAR | Status: AC | PRN
  Administered 2021-11-21: 100 mL via INTRAVENOUS

## 2021-11-21 NOTE — Patient Instructions (Signed)
It was good to see you today.  You are healing well from surgery.  Remember no heavy lifting until least 6 weeks after surgery and nothing in the vagina until 8.  We will work to get you scheduled to see Dr. Sondra Come, our radiation oncologist.  After you have finished treatment, I will see you back for follow-up.

## 2021-11-21 NOTE — Progress Notes (Unsigned)
Gynecologic Oncology Return Clinic Visit  11/21/21  Reason for Visit: Follow-up after surgery, treatment discussion  Treatment History: Oncology History  Endometrial cancer ([place])  09/25/2021 Initial Biopsy   EMB - grade 1 endometrioid adenocarcinoma, MMR IHC intact   10/06/2021 Initial Diagnosis   Endometrial cancer ([place])   10/22/2021 Surgery   TRH/BSO, SLN bilaterally, LOA  Findings: On EUA, small mobile uterus. On intra-abdominal entry, normal upper abdominal survey. Some adhesions of the omentum to the left abdominal side wall in the upper abdomen. Some adhesions of the ascending colon to the right abdominal side wall and of the cecum to the right IP ligament. 8 cm uterus, normal in appearance. Normal appearing. Bilateral adnexa. No gross adenopathy. Mapping successful to bilateral obturator SLNs. Sigmoid colon adherent to the left IP ligament and left pelvic sidewall.   10/22/2021 Pathology Results   Stage IB, grade 1 MI 63% (difficult to determine given adenomyosis) LVI negative SLN on right negative, no definitive SLN seen in tissue excised on left (lymphoid cells negative for malignancy)    CT A/P on 7/7:  1. Status post hysterectomy and bilateral salpingo-oophorectomy. 2. There are a few small nodules noted in the anterior abdomen adjacent to the left hepatic lobe, all measuring less than 5 mm. Recommend attention on future studies. A follow-up CT abdomen/pelvis in 4-6 months is recommended. 3. Status post cholecystectomy without biliary dilatation.  Interval History: Doing well. Denies any abdominal or pelvic pain. Reports normal bowel and bladder function. Denies vaginal bleeding or discharge.   Past Medical/Surgical History: Past Medical History:  Diagnosis Date   Arthritis    BMI 39.0-39.9,adult    Diabetes ([place])    on Trulicity   Hypertension    PONV (postoperative nausea and vomiting)    Sleep apnea    C PAP    Past Surgical History:  Procedure Laterality  Date   CHOLECYSTECTOMY  2004   LYMPH NODE BIOPSY N/A 10/22/2021   Procedure: Sentinel LYMPH NODE BIOPSY;  Surgeon: [name], MD;  Location: [facility];  Service: Gynecology;  Laterality: N/A;   ROBOTIC ASSISTED TOTAL HYSTERECTOMY WITH BILATERAL SALPINGO OOPHERECTOMY Bilateral 10/22/2021   Procedure: XI ROBOTIC ASSISTED TOTAL HYSTERECTOMY WITH BILATERAL SALPINGO OOPHORECTOMY;  Surgeon: [name], MD;  Location: [facility];  Service: Gynecology;  Laterality: Bilateral;    Family History  Problem Relation Age of Onset   Cancer Mother 23       primary peritoneal cancer   Alzheimer's disease Mother    Cancer Father 71       stomach or pancreatic   Heart disease Father    Hypertension Brother    Heart attack Brother 75       MI x 2   Breast cancer Paternal Aunt 41   Diabetes Paternal Grandmother    Colon cancer Neg Hx    Prostate cancer Neg Hx    Ovarian cancer Neg Hx     Social History   Socioeconomic History   Marital status: Married    Spouse name: Not on file   Number of children: Not on file   Years of education: Not on file   Highest education level: Not on file  Occupational History   Occupation: retired Product manager: RETIRED  Tobacco Use   Smoking status: Never   Smokeless tobacco: Never  Vaping Use   Vaping Use: Never used  Substance and Sexual Activity   Alcohol use: Yes    Alcohol/week: 4.0 standard drinks  of alcohol    Types: 4 Standard drinks or equivalent per week    Comment: occas   Drug use: No   Sexual activity: Not Currently    Birth control/protection: Post-menopausal    Comment: 1st intercourse 71 yo.---Fewer than 5 partners  Other Topics Concern   Not on file  Social History Narrative   Retired from being a Education officer, museum.    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 years    0 children    Social Determinants of Health   Financial Resource Strain: Low Risk  (12/18/2020)   Overall Financial Resource Strain (CARDIA)    Difficulty of Paying Living  Expenses: Not hard at all  Food Insecurity: No Food Insecurity (12/25/2019)   Hunger Vital Sign    Worried About Running Out of Food in the Last Year: Never true    [place] in the Last Year: Never true  Transportation Needs: No Transportation Needs (12/18/2020)   PRAPARE - Hydrologist (Medical): No    Lack of Transportation (Non-Medical): No  Physical Activity: Sufficiently Active (12/18/2020)   Exercise Vital Sign    Days of Exercise per Week: 4 days    Minutes of Exercise per Session: 40 min  Stress: No Stress Concern Present (12/25/2019)   [place]    Feeling of Stress : Not at all  Social Connections: [place] (12/18/2020)   Social Connection and Isolation Panel [NHANES]    Frequency of Communication with Friends and Family: Three times a week    Frequency of Social Gatherings with Friends and Family: Three times a week    Attends Religious Services: More than 4 times per year    Active Member of Clubs or Organizations: Yes    Attends Archivist Meetings: More than 4 times per year    Marital Status: Married    Current Medications:  Current Outpatient Medications:    acetaminophen (TYLENOL) 500 MG tablet, Take 500-1,000 mg by mouth every 6 (six) hours as needed (pain.)., Disp: , Rfl:    aspirin EC 81 MG tablet, Take 81 mg by mouth in the morning., Disp: , Rfl:    Cholecalciferol (VITAMIN D3 PO), Take 1 tablet by mouth 2 (two) times a week., Disp: , Rfl:    Continuous Blood Gluc Sensor (FREESTYLE LIBRE 3 SENSOR) MISC, 1 Device by Does not apply route every 14 (fourteen) days. Place 1 sensor on the skin every 14 days. Use to check glucose continuously, Disp: 2 each, Rfl: 6   ibuprofen (ADVIL) 600 MG tablet, Take 1 tablet (600 mg total) by mouth every 6 (six) hours as needed for moderate pain. For AFTER surgery only, Disp: 30 tablet, Rfl: 0    losartan-hydrochlorothiazide (HYZAAR) 50-12.5 MG tablet, TAKE 1 TABLET BY MOUTH DAILY, Disp: 90 tablet, Rfl: 2   triamcinolone cream (KENALOG) 0.1 %, Apply topically., Disp: , Rfl:    TRULICITY 9.60 AV/4.0JW SOPN, ADMINISTER 0.75 MG UNDER THE SKIN 1 TIME A WEEK (Patient taking differently: Inject 0.75 mg into the skin every Wednesday.), Disp: 6 mL, Rfl: 1 No current facility-administered medications for this visit.  Facility-Administered Medications Ordered in Other Visits:    sodium chloride (PF) 0.9 % injection, , , ,   Review of Systems: Denies appetite changes, fevers, chills, fatigue, unexplained weight changes. Denies hearing loss, neck lumps or masses, mouth sores, ringing in ears or voice changes. Denies cough or wheezing.  Denies shortness of  breath. Denies chest pain or palpitations. Denies leg swelling. Denies abdominal distention, pain, blood in stools, constipation, diarrhea, nausea, vomiting, or early satiety. Denies pain with intercourse, dysuria, frequency, hematuria or incontinence. Denies hot flashes, pelvic pain, vaginal bleeding or vaginal discharge.   Denies joint pain, back pain or muscle pain/cramps. Denies itching, rash, or wounds. Denies dizziness, headaches, numbness or seizures. Denies swollen lymph nodes or glands, denies easy bruising or bleeding. Denies anxiety, depression, confusion, or decreased concentration.  Physical Exam: BP 136/70 (Patient Position: Sitting)   Pulse 88   Temp 98.1 F (36.7 C) (Tympanic)   Wt 229 lb (103.9 kg)   SpO2 98%   BMI 39.31 kg/m  General: Alert, oriented, no acute distress. HEENT: Normocephalic, atraumatic, sclera anicteric. Chest: Unlabored breathing on room air. Abdomen: Obese, soft, nontender.  Normoactive bowel sounds.  No masses or hepatosplenomegaly appreciated.  Well-healed incisions. Extremities: Grossly normal range of motion.  Warm, well perfused.  No edema bilaterally. Skin: No rashes or lesions noted. GU:  Normal appearing external genitalia without erythema, excoriation, or lesions.  Speculum exam reveals cuff intact with suture visible.  Bimanual exam reveals cuff intact, no fluctuance or tenderness on palpation.    Laboratory & Radiologic Studies: None new  Assessment & Plan: Kylie Davis is a 71 y.o. 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 MMR intact. MSS. P53 wildtype.   The patient is doing well and meeting postoperative milestones. Discussed continued expectations and restrictions.   Pathology was reviewed again with her. CT Plan for VBT    Reviewed pathology in depth with her. DOI estimation appears somewhat challenging given adenomyosis. Right SLN negative, no true lymph node appreciate from left biopsy. Given her high-intermediate risk features (outer half invasion in the setting of age >32), she would benefit from adjuvant treatment to decrease her risk of recurrence.    I discussed the lack of true lymph node assessment on the left based on final pathology results. By older data, in a grade 1 tumor with middle 1/3 invasion, her risk of pelvic lymph node involvement would be 0% and her risk of PA node involvement would be 5%. By Mayo criteria used (presuming uterus sent during surgery), then risk of pelvic nodal involvement would be estimated to be >5%.   I discussed with the patient and her husband that I will clarify with pathology findings from left SLN biopsy. Will await MSI testing and p53.    Treatment options would include: Robotic left pelvic lymphadenectomy (risk of isolated PA lymph node metastasis in early stage endometrial cancer is <2%). If confirmed negative lymph nodes, then would plan for VBT as adjuvant treatment.  Patient could be treated with pelvic RT based on PORTEC1. Plan for CT scan with decision about second surgery for lymph node excision based on results. If normal appearing lymph nodes,  then making a decision regarding adjuvant therapy with VBT only versus consideration of pelvic RT.     I will call the patient after our tumor board to discuss next steps.   *** minutes of total time was spent for this patient encounter, including preparation, face-to-face counseling with the patient and coordination of care, and documentation of the encounter.  Jeral Pinch, MD  Division of Gynecologic Oncology  Department of Obstetrics and Gynecology  [facility]

## 2021-11-24 ENCOUNTER — Telehealth: Payer: Self-pay | Admitting: Gynecologic Oncology

## 2021-11-24 DIAGNOSIS — C541 Malignant neoplasm of endometrium: Secondary | ICD-10-CM

## 2021-11-24 NOTE — Telephone Encounter (Signed)
Called the patient to review CT imaging.  Looked at the images with one of our body radiologist this morning.  He recommends close follow-up in 3 months.  Unfortunately, none of the lesions in the upper abdomen are big enough that we would expect to be able to evaluate them on PET scan.  If similar in size or decreased at follow-up scan, this would be very reassuring for a likely benign process.  CT scan ordered, will asked the clinic to get this scheduled on a Wednesday approximately 3 months after her last.  Jeral Pinch MD Gynecologic Oncology

## 2021-11-24 NOTE — Telephone Encounter (Signed)
In error

## 2021-11-25 ENCOUNTER — Telehealth: Payer: Self-pay | Admitting: *Deleted

## 2021-11-25 NOTE — Telephone Encounter (Signed)
Per Dr Berline Lopes scheduled the patient for a lab and CT appt in Oct. Spoke with the patient and gave date/time and instructions for appts. Patient will need to pick up the contrast on the day of her lab appt

## 2021-11-25 NOTE — Telephone Encounter (Signed)
Done and patient aware.

## 2021-11-26 ENCOUNTER — Encounter: Payer: Medicare PPO | Admitting: Genetic Counselor

## 2021-11-26 ENCOUNTER — Ambulatory Visit: Payer: Medicare PPO

## 2021-12-05 NOTE — Progress Notes (Signed)
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 LYMPH NODE, SENTINEL, RIGHT OBTURATOR, BIOPSY:  -  2 lymph nodes negative for malignancy.   B. LYMPH NODE, SENTINEL, LEFT OBTURATOR, BIOPSY:  -  Scant aggregate of lymphoid cells, negative for malignancy, in the  background of predominantly unremarkable fibroadipose tissue (definitive  lymph node with capsule is not identified).   C. UTERUS, CERVIX, BILATERAL FALLOPIAN TUBES AND OVARIES:  -  Invasive endometrioid carcinoma, NOS (FIGO grade 1 of 3) involving  approximately 60% of the myometrium, see note.  -  Immunohistochemical stains for the mismatch repair proteins were  performed on MCS [id number] and showed retained nuclear staining, i.e.  normal staining pattern/negative for microsatellite instability,  -  Unremarkable cervix, negative for dysplasia.  -  Unremarkable bilateral fallopian tubes.  -  Unremarkable bilateral ovaries.  pT1b pN0 pM n/a (FIGO stage IB)   Past/Anticipated interventions by Gyn/Onc surgery, if any: Robotic-assisted laparoscopic total hysterectomy with bilateral salpingoophorectomy, SLN biopsy bilaterally, lysis of adhesions on 10/22/2021 by Dr. Berline Lopes  Past/Anticipated interventions by medical oncology, if any: no  Weight changes, if any: {:18581}  Bowel/Bladder complaints, if any: {yes no:[id number]}, {Blank single:19197::"diarrhea","constipation","urinary frequency","burning","trouble emptying bladder"," "}  Nausea/Vomiting, if any: {:18581}  Pain issues, if any:  {:18581}  SAFETY ISSUES: Prior radiation? {:18581} Pacemaker/ICD? {:18581} Possible current pregnancy? no Is the patient on methotrexate? {:18581}  Current Complaints / other details:  ***

## 2021-12-07 NOTE — Progress Notes (Signed)
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 female who is accompanied by her husband. she is seen as a courtesy of Dr. Berline Lopes for an opinion concerning radiation therapy as part of management for her recently diagnosed endometrial cancer.   The patient presented to her PCP on 09/15/21 with the cc of the lower abdominal pain, low back pain, and blood in the urine. Despite negative urine cultures, she was placed on cipro without resolution of her symptoms. During a follow-up visit with her PCP on 09/18/21, the patient reported new postmenopausal bleeding. Accordingly, the patient was referred to her OB/Gyn, Dr. Ralph Dowdy, on 09/25/21 who collected an endometrial biopsy revealing FIGO grade 1 endometrioid carcinoma (MMR normal). Pap smear also collected at that time revealed HPV negative ASC-US.    Pelvic US on 10/15/21 showed an abnormally thickened endometrial complex at 12 mm thick. The bilateral ovaries were not visualized.   Subsequently, the patient was referred to Dr. Berline Lopes and proceeded to undergo a total hysterectomy with BSO and nodal biopsies on 10/22/21. Pathology from the procedure revealed: FIGO grade 1 invasive endometrioid carcinoma involving  approximately 60% of the myometrium. Nodal status of 2/2 right and left sentinel obturator lymph nodes negative for malignancy. Cervix, bilateral fallopian tubes and ovaries were negative for malignancy.   CT of the abdomen and pelvis on 11/21/21 showed a few small nodules noted in the anterior abdomen adjacent  to the left hepatic lobe, all measuring less than 5 mm.   In respect to the abdominal nodules, Dr. Charisse March recommendation is for follow-up imaging in 4-6 months given lack of other peritoneal abnormalities or upper abdominal findings at the time of her recent surgery.  Of note: Family history is notable for father with either pancreatic or stomach cancer in his 33s and mother with peritoneal cancer in her 35s.  Patient also had BRCA1 and 2 testing in the past which was negative.    PREVIOUS RADIATION THERAPY: No  PAST MEDICAL HISTORY:  Past Medical History:  Diagnosis Date   Arthritis    BMI 39.0-39.9,adult    Diabetes ([place])    on Trulicity   Hypertension    PONV (postoperative nausea and vomiting)    Sleep apnea    C PAP    PAST SURGICAL HISTORY: Past Surgical History:  Procedure Laterality Date   CHOLECYSTECTOMY  2004   LYMPH NODE BIOPSY N/A 10/22/2021   Procedure: Sentinel LYMPH NODE BIOPSY;  Surgeon: Kylie Mosses, MD;  Location: [facility];  Service: Gynecology;  Laterality: N/A;   ROBOTIC ASSISTED TOTAL HYSTERECTOMY WITH BILATERAL SALPINGO OOPHERECTOMY Bilateral 10/22/2021   Procedure: XI ROBOTIC ASSISTED TOTAL HYSTERECTOMY WITH BILATERAL SALPINGO OOPHORECTOMY;  Surgeon: Kylie Mosses, MD;  Location: [facility];  Service: Gynecology;  Laterality: Bilateral;    FAMILY HISTORY:  Family History  Problem Relation Age of Onset   Cancer Mother 73       primary peritoneal cancer   Alzheimer's disease Mother    Cancer Father 57       stomach or pancreatic   Heart disease Father  Hypertension Brother    Heart attack Brother 34       MI x 2   Breast cancer Paternal Aunt 48   Diabetes Paternal Grandmother    Colon cancer Neg Hx    Prostate cancer Neg Hx    Ovarian cancer Neg Hx     SOCIAL HISTORY:  Social History   Tobacco Use   Smoking status: Never   Smokeless tobacco: Never  Vaping Use   Vaping Use: Never used  Substance Use Topics   Alcohol use: Yes     Alcohol/week: 4.0 standard drinks of alcohol    Types: 4 Standard drinks or equivalent per week    Comment: occas   Drug use: No    ALLERGIES: No Known Allergies  MEDICATIONS:  Current Outpatient Medications  Medication Sig Dispense Refill   acetaminophen (TYLENOL) 500 MG tablet Take 500-1,000 mg by mouth every 6 (six) hours as needed (pain.).     aspirin EC 81 MG tablet Take 81 mg by mouth in the morning.     Cholecalciferol (VITAMIN D3 PO) Take 1 tablet by mouth 2 (two) times a week.     Continuous Blood Gluc Sensor (FREESTYLE LIBRE 3 SENSOR) MISC 1 Device by Does not apply route every 14 (fourteen) days. Place 1 sensor on the skin every 14 days. Use to check glucose continuously 2 each 6   losartan-hydrochlorothiazide (HYZAAR) 50-12.5 MG tablet TAKE 1 TABLET BY MOUTH DAILY 90 tablet 2   TRULICITY 8.36 OQ/9.4TM SOPN ADMINISTER 0.75 MG UNDER THE SKIN 1 TIME A WEEK (Patient taking differently: Inject 0.75 mg into the skin every Wednesday.) 6 mL 1   ibuprofen (ADVIL) 600 MG tablet Take 1 tablet (600 mg total) by mouth every 6 (six) hours as needed for moderate pain. For AFTER surgery only (Patient not taking: Reported on 12/08/2021) 30 tablet 0   No current facility-administered medications for this encounter.    REVIEW OF SYSTEMS:  A 10+ POINT REVIEW OF SYSTEMS WAS OBTAINED including neurology, dermatology, psychiatry, cardiac, respiratory, lymph, extremities, GI, GU, musculoskeletal, constitutional, reproductive, HEENT.  She denies any pelvic pain vaginal bleeding or discharge at this time.   PHYSICAL EXAM:  weight is 229 lb (103.9 kg). Her oral temperature is 99 F (37.2 C). Her blood pressure is 149/82 (abnormal) and her pulse is 79. Her respiration is 18 and oxygen saturation is 99%.   General: Alert and oriented, in no acute distress HEENT: Head is normocephalic. Extraocular movements are intact. Oropharynx is clear. Neck: Neck is supple, no palpable cervical or supraclavicular  lymphadenopathy. Heart: Regular in rate and rhythm with no murmurs, rubs, or gallops. Chest: Clear to auscultation bilaterally, with no rhonchi, wheezes, or rales. Abdomen: Soft, nontender, nondistended, with no rigidity or guarding. Extremities: No cyanosis or edema. Lymphatics: see Neck Exam Skin: No concerning lesions. Musculoskeletal: symmetric strength and muscle tone throughout. Neurologic: Cranial nerves II through XII are grossly intact. No obvious focalities. Speech is fluent. Coordination is intact. Psychiatric: Judgment and insight are intact. Affect is appropriate.  A pelvic exam is deferred today.  This will be performed at the time of her vaginal brachytherapy planning and treatment.   ECOG = 1  0 - Asymptomatic (Fully active, able to carry on all predisease activities without restriction)  1 - Symptomatic but completely ambulatory (Restricted in physically strenuous activity but ambulatory and able to carry out work of a light or sedentary nature. For example, light housework, office work)  2 - Symptomatic, <50% in  bed during the day (Ambulatory and capable of all self care but unable to carry out any work activities. Up and about more than 50% of waking hours)  3 - Symptomatic, >50% in bed, but not bedbound (Capable of only limited self-care, confined to bed or chair 50% or more of waking hours)  4 - Bedbound (Completely disabled. Cannot carry on any self-care. Totally confined to bed or chair)  5 - Death   Eustace Pen MM, Creech RH, Tormey DC, et al. [phone]). "Toxicity and response criteria of the [employer]". [place] Oncol. 5 (6): 649-55  LABORATORY DATA:  Lab Results  Component Value Date   WBC 7.4 10/16/2021   HGB 14.3 10/16/2021   HCT 43.9 10/16/2021   MCV 89.4 10/16/2021   PLT 317 10/16/2021   NEUTROABS 2.9 09/18/2021   Lab Results  Component Value Date   NA 141 10/16/2021   K 4.1 10/16/2021   CL 102 10/16/2021   CO2 30  10/16/2021   GLUCOSE 106 (H) 10/16/2021   BUN 21 10/16/2021   CREATININE 0.91 10/16/2021   CALCIUM 9.9 10/16/2021      RADIOGRAPHY: CT Abdomen Pelvis W Contrast  Result Date: 11/21/2021 CLINICAL DATA:  Staging uterine/cervical cancer. Recent robotic assisted total hysterectomy and bilateral salpingo oophorectomy. EXAM: CT ABDOMEN AND PELVIS WITH CONTRAST TECHNIQUE: Multidetector CT imaging of the abdomen and pelvis was performed using the standard protocol following bolus administration of intravenous contrast. RADIATION DOSE REDUCTION: This exam was performed according to the departmental dose-optimization program which includes automated exposure control, adjustment of the mA and/or kV according to patient size and/or use of iterative reconstruction technique. CONTRAST:  173m OMNIPAQUE IOHEXOL 300 MG/ML  SOLN COMPARISON:  None Available. FINDINGS: Lower chest: The lung bases are clear of acute process. No pleural effusion or pulmonary lesions. The heart is normal in size. No pericardial effusion. The distal esophagus and aorta are unremarkable. Hepatobiliary: No hepatic lesions or evidence of peritoneal surface disease involving liver. No intrahepatic biliary dilatation. The gallbladder is surgically absent. No common bile duct dilatation. Pancreas: No mass, inflammation or ductal dilatation. Spleen: Normal size. No focal lesions. Adrenals/Urinary Tract: Adrenal glands normal. No renal lesions or hydronephrosis. The bladder is unremarkable. Stomach/Bowel: The stomach, duodenum, small and colon unremarkable. No acute inflammatory process, mass lesions or obstructive findings. Sigmoid colon diverticulosis noted. The terminal ileum and appendix are. Vascular/Lymphatic: Minimal aortic calcifications for age. No dissection. The branch vessels are patent. No mesenteric or retroperitoneal mass or adenopathy. No overt peritoneal or omental implants. However, there are a few small nodules noted in the anterior  abdomen all measuring less than 5 mm. Four small nodules anterior to the left hepatic lobe. Recommend attention future studies. No pelvic lymphadenopathy. No inguinal lymphadenopathy. Reproductive: Surgically absent. Other: No ascites or abdominal wall hernia. No subcutaneous lesions. Musculoskeletal: No significant bony findings. IMPRESSION: 1. Status post hysterectomy and bilateral salpingo-oophorectomy. 2. There are a few small nodules noted in the anterior abdomen adjacent to the left hepatic lobe, all measuring less than 5 mm. Recommend attention on future studies. A follow-up CT abdomen/pelvis in 4-6 months is recommended. 3. Status post cholecystectomy without biliary dilatation. * Tracking Code: BO * Electronically Signed   By: PMarijo SanesM.D.   On: 11/21/2021 13:52   MM DIAG BREAST TOMO UNI LEFT  Result Date: 11/12/2021 CLINICAL DATA:  Patient underwent a ultrasound-guided core needle biopsy on 05/16/2021 with benign concordant results. Six-month follow-up is recommended. EXAM: DIGITAL DIAGNOSTIC  UNILATERAL LEFT MAMMOGRAM WITH TOMOSYNTHESIS AND CAD TECHNIQUE: Left digital diagnostic mammography and breast tomosynthesis was performed. The images were evaluated with computer-aided detection. COMPARISON:  Previous exam(s). ACR Breast Density Category b: There are scattered areas of fibroglandular density. FINDINGS: The small mass in the lower inner left breast that was biopsied on 11/14/2020 is no longer visualized. There is a ribbon shaped post biopsy marker clip in this location. There are no new masses, no areas of architectural distortion, no significant asymmetry and no suspicious calcifications. IMPRESSION: 1. No evidence of breast malignancy. RECOMMENDATION: 1. Return to annual screening mammography. Last screening study performed on 05/07/2021. I have discussed the findings and recommendations with the patient. If applicable, a reminder letter will be sent to the patient regarding the next  appointment. BI-RADS CATEGORY  1: Negative. Electronically Signed   By: Lajean Manes M.D.   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  Based on her age and pathologic findings she would be at risk for vaginal cuff recurrence and would recommend vaginal brachytherapy to reduce chance of recurrence.  Today, I talked to the patient and her husband about the findings and work-up thus far.  We discussed the natural history of endometrial cancer and general treatment, highlighting the role of radiotherapy in the management.  We discussed the available radiation techniques, and focused on the details of logistics and delivery.  We reviewed the anticipated acute and late sequelae associated with radiation in this setting.  The patient was encouraged to ask questions that I answered to the best of my ability.  A patient consent form was discussed and signed.  We retained a copy for our records.  The patient would like to proceed with radiation and will be scheduled for CT simulation.  PLAN: She will be scheduled to begin vaginal brachytherapy in the near future.  She is 6-1/2 weeks out from her surgery and is healing well according to Dr. Charisse March examination.  She will receive 5 high-dose-rate treatments directed at the vaginal cuff.  Iridium 192 will be the high-dose-rate source.   60 minutes of total time was spent for this patient encounter, including preparation, face-to-face counseling with the patient and coordination of care, physical exam, and documentation of the encounter.   ------------------------------------------------  Blair Promise, PhD, MD  This document serves as a record of services personally performed by Gery Pray, MD. It was created on his behalf by  Roney Mans, a trained medical scribe. The creation of this record is based on the scribe's personal observations and the provider's statements to them. This document has been checked and approved by the attending provider.

## 2021-12-08 ENCOUNTER — Encounter: Payer: Self-pay | Admitting: Radiation Oncology

## 2021-12-08 ENCOUNTER — Other Ambulatory Visit: Payer: Self-pay

## 2021-12-08 ENCOUNTER — Ambulatory Visit
Admission: RE | Admit: 2021-12-08 | Discharge: 2021-12-08 | Disposition: A | Discharge status: Home / Self Care | Payer: Medicare PPO | Source: Ambulatory Visit | Attending: Radiation Oncology | Admitting: Radiation Oncology

## 2021-12-08 VITALS — BP 149/82 | HR 79 | Temp 99.0°F | Resp 18 | Wt 229.0 lb

## 2021-12-08 DIAGNOSIS — K5730 Diverticulosis of large intestine without perforation or abscess without bleeding: Secondary | ICD-10-CM | POA: Insufficient documentation

## 2021-12-08 DIAGNOSIS — Z800 Family history of malignant neoplasm of digestive organs: Secondary | ICD-10-CM | POA: Diagnosis not present

## 2021-12-08 DIAGNOSIS — Z808 Family history of malignant neoplasm of other organs or systems: Secondary | ICD-10-CM | POA: Diagnosis not present

## 2021-12-08 DIAGNOSIS — E119 Type 2 diabetes mellitus without complications: Secondary | ICD-10-CM | POA: Insufficient documentation

## 2021-12-08 DIAGNOSIS — Z79899 Other long term (current) drug therapy: Secondary | ICD-10-CM | POA: Diagnosis not present

## 2021-12-08 DIAGNOSIS — G4730 Sleep apnea, unspecified: Secondary | ICD-10-CM | POA: Diagnosis not present

## 2021-12-08 DIAGNOSIS — M129 Arthropathy, unspecified: Secondary | ICD-10-CM | POA: Insufficient documentation

## 2021-12-08 DIAGNOSIS — C541 Malignant neoplasm of endometrium: Secondary | ICD-10-CM | POA: Diagnosis not present

## 2021-12-08 DIAGNOSIS — Z90710 Acquired absence of both cervix and uterus: Secondary | ICD-10-CM | POA: Diagnosis not present

## 2021-12-08 DIAGNOSIS — I10 Essential (primary) hypertension: Secondary | ICD-10-CM | POA: Diagnosis not present

## 2021-12-08 DIAGNOSIS — Z803 Family history of malignant neoplasm of breast: Secondary | ICD-10-CM | POA: Diagnosis not present

## 2021-12-08 DIAGNOSIS — Z90722 Acquired absence of ovaries, bilateral: Secondary | ICD-10-CM | POA: Insufficient documentation

## 2021-12-09 ENCOUNTER — Telehealth: Payer: Self-pay | Admitting: *Deleted

## 2021-12-09 NOTE — Telephone Encounter (Signed)
Called patient to inform of [facility], spoke with patient and she is aware of these appts. 

## 2021-12-10 ENCOUNTER — Inpatient Hospital Stay: Payer: Medicare PPO

## 2021-12-10 ENCOUNTER — Other Ambulatory Visit: Payer: Self-pay | Admitting: Genetic Counselor

## 2021-12-10 ENCOUNTER — Inpatient Hospital Stay (HOSPITAL_BASED_OUTPATIENT_CLINIC_OR_DEPARTMENT_OTHER): Payer: Medicare PPO | Admitting: Genetic Counselor

## 2021-12-10 ENCOUNTER — Encounter: Payer: Self-pay | Admitting: Genetic Counselor

## 2021-12-10 ENCOUNTER — Other Ambulatory Visit: Payer: Self-pay

## 2021-12-10 ENCOUNTER — Telehealth: Payer: Self-pay | Admitting: *Deleted

## 2021-12-10 DIAGNOSIS — Z800 Family history of malignant neoplasm of digestive organs: Secondary | ICD-10-CM | POA: Diagnosis not present

## 2021-12-10 DIAGNOSIS — Z808 Family history of malignant neoplasm of other organs or systems: Secondary | ICD-10-CM | POA: Diagnosis not present

## 2021-12-10 DIAGNOSIS — Z803 Family history of malignant neoplasm of breast: Secondary | ICD-10-CM | POA: Diagnosis not present

## 2021-12-10 DIAGNOSIS — C541 Malignant neoplasm of endometrium: Secondary | ICD-10-CM | POA: Diagnosis not present

## 2021-12-10 LAB — GENETIC SCREENING ORDER

## 2021-12-10 NOTE — Progress Notes (Signed)
REFERRING PROVIDER: [name], MD [place],  [place] [address]  PRIMARY PROVIDER:  [name], NP  PRIMARY REASON FOR VISIT:  1. Family history of peritoneal cancer   2. Family history of pancreatic cancer   3. Family history of malignant neoplasm of breast   4. Endometrial cancer ([place])      HISTORY OF PRESENT ILLNESS:   Kylie Davis, a 71 y.o. female, was seen for a [facility] cancer genetics consultation at the request of Dr. Berline Lopes due to a personal and family history of cancer.  Kylie Davis presents to clinic today to discuss the possibility of a hereditary predisposition to cancer, genetic testing, and to further clarify her future cancer risks, as well as potential cancer risks for family members.   In May 2023, at the age of 38, Kylie Davis was diagnosed with grade 1 endometrial cancer.  The treatment plan included TAH/BSO. IHC and MSI were normal.  The patient reports having BRCA testing in 2013 which was negative.      CANCER HISTORY:  Oncology History  Endometrial cancer ([place])  09/25/2021 Initial Biopsy   EMB - grade 1 endometrioid adenocarcinoma, MMR IHC intact   10/06/2021 Initial Diagnosis   Endometrial cancer ([place])   10/22/2021 Surgery   TRH/BSO, SLN bilaterally, LOA  Findings: On EUA, small mobile uterus. On intra-abdominal entry, normal upper abdominal survey. Some adhesions of the omentum to the left abdominal side wall in the upper abdomen. Some adhesions of the ascending colon to the right abdominal side wall and of the cecum to the right IP ligament. 8 cm uterus, normal in appearance. Normal appearing. Bilateral adnexa. No gross adenopathy. Mapping successful to bilateral obturator SLNs. Sigmoid colon adherent to the left IP ligament and left pelvic sidewall.   10/22/2021 Pathology Results   Stage IB, grade 1 MI 63% (difficult to determine given adenomyosis) LVI negative SLN on right negative, no definitive SLN seen in tissue excised on  left (lymphoid cells negative for malignancy)      RISK FACTORS:  Menarche was at age 29.  First live birth at age N/A.  OCP use for approximately 5 years.  Ovaries intact: no.  Hysterectomy: yes.  Menopausal status: postmenopausal.  HRT use: 0 years. Colonoscopy: yes; normal. Mammogram within the last year: yes. Number of breast biopsies: 0. Up to date with pelvic exams: n/a. Any excessive radiation exposure in the past: no  Past Medical History:  Diagnosis Date   Arthritis    BMI 39.0-39.9,adult    Diabetes ([place])    on Trulicity   Family history of pancreatic cancer    Family history of peritoneal cancer    Hypertension    PONV (postoperative nausea and vomiting)    Sleep apnea    C PAP    Past Surgical History:  Procedure Laterality Date   CHOLECYSTECTOMY  2004   LYMPH NODE BIOPSY N/A 10/22/2021   Procedure: Sentinel LYMPH NODE BIOPSY;  Surgeon: [name], MD;  Location: [facility];  Service: Gynecology;  Laterality: N/A;   ROBOTIC ASSISTED TOTAL HYSTERECTOMY WITH BILATERAL SALPINGO OOPHERECTOMY Bilateral 10/22/2021   Procedure: XI ROBOTIC ASSISTED TOTAL HYSTERECTOMY WITH BILATERAL SALPINGO OOPHORECTOMY;  Surgeon: [name], MD;  Location: [facility];  Service: Gynecology;  Laterality: Bilateral;    Social History   Socioeconomic History   Marital status: Married    Spouse name: Not on file   Number of children: Not on file   Years of education: Not  on file   Highest education level: Not on file  Occupational History   Occupation: retired Product manager: RETIRED  Tobacco Use   Smoking status: Never   Smokeless tobacco: Never  Vaping Use   Vaping Use: Never used  Substance and Sexual Activity   Alcohol use: Yes    Alcohol/week: 4.0 standard drinks of alcohol    Types: 4 Standard drinks or equivalent per week    Comment: occas   Drug use: No   Sexual activity: Not Currently    Birth control/protection: Post-menopausal    Comment: 1st  intercourse 71 yo.---Fewer than 5 partners  Other Topics Concern   Not on file  Social History Narrative   Retired from being a Education officer, museum.    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 years    0 children    Social Determinants of Health   Financial Resource Strain: Low Risk  (12/18/2020)   Overall Financial Resource Strain (CARDIA)    Difficulty of Paying Living Expenses: Not hard at all  Food Insecurity: No Food Insecurity (12/25/2019)   Hunger Vital Sign    Worried About Running Out of Food in the Last Year: Never true    [place] in the Last Year: Never true  Transportation Needs: No Transportation Needs (12/18/2020)   PRAPARE - Hydrologist (Medical): No    Lack of Transportation (Non-Medical): No  Physical Activity: Sufficiently Active (12/18/2020)   Exercise Vital Sign    Days of Exercise per Week: 4 days    Minutes of Exercise per Session: 40 min  Stress: No Stress Concern Present (12/25/2019)   [place]    Feeling of Stress : Not at all  Social Connections: [place] (12/18/2020)   Social Connection and Isolation Panel [NHANES]    Frequency of Communication with Friends and Family: Three times a week    Frequency of Social Gatherings with Friends and Family: Three times a week    Attends Religious Services: More than 4 times per year    Active Member of Clubs or Organizations: Yes    Attends Music therapist: More than 4 times per year    Marital Status: Married     FAMILY HISTORY:  We obtained a detailed, 4-generation family history.  Significant diagnoses are listed below: Family History  Problem Relation Age of Onset   Cancer Mother 57       primary peritoneal cancer   Alzheimer's disease Mother    Heart disease Father    Pancreatic cancer Father 66   Hypertension Brother    Heart attack Brother 85       MI x 2   Breast cancer Paternal Aunt 30   Diabetes Paternal  Grandmother    Colon cancer Neg Hx    Prostate cancer Neg Hx    Ovarian cancer Neg Hx     The patient does not have children.  She has one brother who is cancer free.  Both parents are deceased.  The patient's father died of pancreatic cancer. He had 13 siblings, one had breast cancer.  There is no other reported family history of cancer.  The patient's mother had peritoneal cancer at 31.  She had three sisters and a brother who are cancer free.  There is no other reported family history of cancer.  Kylie Davis is aware of previous family history of genetic testing for hereditary cancer risks.  Patient's maternal ancestors are of Caucasian descent, and paternal ancestors are of [place] descent. There is no reported Ashkenazi Jewish ancestry. There is no known consanguinity.  GENETIC COUNSELING ASSESSMENT: Kylie Davis is a 71 y.o. female with a personal and family history of cancer which is somewhat suggestive of a hereditary cancer syndrome and predisposition to cancer given the combination of cancer in the family. We, therefore, discussed and recommended the following at today's visit.   DISCUSSION: We discussed that, in general, most cancer is not inherited in families, but instead is sporadic or familial. Sporadic cancers occur by chance and typically happen at older ages (>50 years) as this type of cancer is caused by genetic changes acquired during an individual's lifetime. Some families have more cancers than would be expected by chance; however, the ages or types of cancer are not consistent with a known genetic mutation or known genetic mutations have been ruled out. This type of familial cancer is thought to be due to a combination of multiple genetic, environmental, hormonal, and lifestyle factors. While this combination of factors likely increases the risk of cancer, the exact source of this risk is not currently identifiable or testable.  We discussed that 3 - 5% of uterine cancer is  hereditary, with most cases associated with Lynch syndrome.  There are other genes that can be associated with hereditary uterine cancer syndromes.  These include BRCA mutations as well as PTEN.  We discussed that testing is beneficial for several reasons including knowing how to follow individuals after completing their treatment, identifying whether potential treatment options such as PARP inhibitors would be beneficial, and understand if other family members could be at risk for cancer and allow them to undergo genetic testing.   We discussed that some people do not want to undergo genetic testing due to fear of genetic discrimination.  A federal law called the Genetic Information Non-Discrimination Act (GINA) of 2008 helps protect individuals against genetic discrimination based on their genetic test results.  It impacts both health insurance and employment.  With health insurance, it protects against increased premiums, being kicked off insurance or being forced to take a test in order to be insured.  For employment it protects against hiring, firing and promoting decisions based on genetic test results.  Health status due to a cancer diagnosis is not protected under GINA.   We reviewed the characteristics, features and inheritance patterns of hereditary cancer syndromes. We also discussed genetic testing, including the appropriate family members to test, the process of testing, insurance coverage and turn-around-time for results. We discussed the implications of a negative, positive, carrier and/or variant of uncertain significant result. We recommended Kylie Davis pursue genetic testing for the CancerNext-Expanded+RNAinsight gene panel.   The CancerNext-Expanded gene panel offered by [facility] and includes sequencing and rearrangement analysis for the following 77 genes: AIP, ALK, APC*, ATM*, AXIN2, BAP1, BARD1, BLM, BMPR1A, BRCA1*, BRCA2*, BRIP1*, CDC73, CDH1*, CDK4, CDKN1B, CDKN2A, CHEK2*, CTNNA1,  DICER1, FANCC, FH, FLCN, GALNT12, KIF1B, LZTR1, MAX, MEN1, MET, MLH1*, MSH2*, MSH3, MSH6*, MUTYH*, NBN, NF1*, NF2, NTHL1, PALB2*, PHOX2B, PMS2*, POT1, PRKAR1A, PTCH1, PTEN*, RAD51C*, RAD51D*, RB1, RECQL, RET, SDHA, SDHAF2, SDHB, SDHC, SDHD, SMAD4, SMARCA4, SMARCB1, SMARCE1, STK11, SUFU, TMEM127, TP53*, TSC1, TSC2, VHL and XRCC2 (sequencing and deletion/duplication); EGFR, EGLN1, HOXB13, KIT, MITF, PDGFRA, POLD1, and POLE (sequencing only); EPCAM and GREM1 (deletion/duplication only). DNA and RNA analyses performed for * genes.   Based on Kylie Davis personal and family history of cancer, she meets medical criteria  for genetic testing. Despite that she meets criteria, she may still have an out of pocket cost. We discussed that if her out of pocket cost for testing is over $100, the laboratory will call and confirm whether she wants to proceed with testing.  If the out of pocket cost of testing is less than $100 she will be billed by the genetic testing laboratory.   PLAN: After considering the risks, benefits, and limitations, Kylie Davis provided informed consent to pursue genetic testing and the blood sample was sent to [facility] for analysis of the CancerNext-Expanded+RNAinsight. Results should be available within approximately 2-3 weeks' time, at which point they will be disclosed by telephone to Kylie Davis, as will any additional recommendations warranted by these results. Kylie Davis will receive a summary of her genetic counseling visit and a copy of her results once available. This information will also be available in Epic.   Lastly, we encouraged Kylie Davis to remain in contact with cancer genetics annually so that we can continuously update the family history and inform her of any changes in cancer genetics and testing that may be of benefit for this family.   Kylie Davis questions were answered to her satisfaction today. Our contact information was provided should additional  questions or concerns arise. Thank you for the referral and allowing [place] to share in the care of your patient.   Breaker Springer P. Fl[place]ne [place], [place], [facility] Licensed, Insurance risk surveyor [email or web address] phone: [phone]  The patient was seen for a total of 45 minutes in face-to-face genetic counseling.  The patient was seen alone.  This patient was discussed with Drs. Magrinat, Lindi Adie and/or Burr Medico who agrees with the above.    _______________________________________________________________________ For Office Staff:  Number of people involved in session: 1 Was an Intern/ student involved with case: no

## 2021-12-10 NOTE — Telephone Encounter (Signed)
Patient stopped by after her genetics appts in the lobby and wanted to know if her lifting restrictions is for 6 or 8 weeks. Per Dr Charisse March not the restrictions are for 6 weeks post surgery. (Surgery 6/7) Patient has no lifting restrictions starting today. Per Dr Berline Lopes; patient can start lifting today just keep in easy and increase at her pace; stop if she starts having pain. Patient verbalized understanding

## 2021-12-12 ENCOUNTER — Telehealth: Payer: Self-pay | Admitting: *Deleted

## 2021-12-12 NOTE — Telephone Encounter (Signed)
CALLED PATIENT TO REMIND OF NEW HDR [place] FOR 12-16-21, SPOKE WITH PATIENT AND SHE IS AWARE OF THESE APPTS.

## 2021-12-15 NOTE — Progress Notes (Signed)
  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 She will be treated with a 2.5 cm diameter segmented cylinder. This conforms to her anatomy without undue discomfort.  Vaginal brachytherapy procedure node: The patient was brought to the [place] suite. Identity was confirmed. All relevant records and images related to the planned course of therapy were reviewed. The patient freely provided informed written consent to proceed with treatment after reviewing the details related to the planned course of therapy. The consent form was witnessed and verified by the simulation staff. Then, the patient was set-up in a stable reproducible supine position for radiation therapy. Pelvic exam revealed the vaginal cuff to be intact . The patient's custom vaginal cylinder was placed in the proximal vagina. This was affixed to the CT/MR stabilization plate to prevent slippage. Patient tolerated the placement well.  Verification simulation note:  A fiducial marker was placed within the vaginal cylinder. An AP and lateral film was then obtained through the pelvis area. This documented accurate position of the vaginal cylinder for treatment.  HDR BRACHYTHERAPY TREATMENT  The remote afterloading device was affixed to the vaginal cylinder by catheter. Patient then proceeded to undergo her first high-dose-rate treatment directed at the proximal vagina. The patient was prescribed a dose of 6.0 gray to be delivered to the mucosal surface. Treatment length was 3.0 cm. Patient was treated with 1 channel using 7 dwell positions.  Treatment time was 149.3 seconds. Iridium 192 was the high-dose-rate source for treatment. The patient tolerated the treatment well. After completion of her therapy, a radiation survey was performed documenting return of the iridium source into the [employer] safe.   PLAN: The patient will return next week for her second high-dose rate treatment.  ________________________________  -----------------------------------  Blair Promise, PhD, MD  This document serves as a record of services personally performed by Gery Pray, MD. It was created on his behalf by Roney Mans, a trained medical scribe. The creation of this record is based on the scribe's personal observations and the provider's statements to them. This document has been checked and approved by the attending provider.

## 2021-12-15 NOTE — Progress Notes (Signed)
Radiation Oncology         [phone]) [phone] ________________________________  Name: Kylie Davis MRN: [id number]  Date: 12/16/2021  DOB: Sep 12, 1950  Vaginal Brachytherapy Procedure Note  CC: [name], NP [name], MD  No diagnosis found.  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 ***  No significant interval history since the patient was last seen in consultation.  ALLERGIES: has No Known Allergies.  Meds: Current Outpatient Medications  Medication Sig Dispense Refill   acetaminophen (TYLENOL) 500 MG tablet Take 500-1,000 mg by mouth every 6 (six) hours as needed (pain.).     aspirin EC 81 MG tablet Take 81 mg by mouth in the morning.     Cholecalciferol (VITAMIN D3 PO) Take 1 tablet by mouth 2 (two) times a week.     Continuous Blood Gluc Sensor (FREESTYLE LIBRE 3 SENSOR) MISC 1 Device by Does not apply route every 14 (fourteen) days. Place 1 sensor on the skin every 14 days. Use to check glucose continuously 2 each 6   ibuprofen (ADVIL) 600 MG tablet Take 1 tablet (600 mg total) by mouth every 6 (six) hours as needed for moderate pain. For AFTER surgery only (Patient not taking: Reported on 12/08/2021) 30 tablet 0   losartan-hydrochlorothiazide (HYZAAR) 50-12.5 MG tablet TAKE 1 TABLET BY MOUTH DAILY 90 tablet 2   TRULICITY 7.67 HA/1.9FX SOPN ADMINISTER 0.75 MG UNDER THE SKIN 1 TIME A WEEK (Patient taking differently: Inject 0.75 mg into the skin every Wednesday.) 6 mL 1   No current facility-administered medications for this encounter.    Physical Findings: The patient is in no acute distress. Patient is alert and oriented.  vitals were not taken for this visit.   No palpable cervical, supraclavicular or axillary lymphoadenopathy. The heart has a regular rate and rhythm.  The lungs are clear to auscultation. Abdomen soft and non-tender.  On pelvic examination the external genitalia were unremarkable. A speculum exam was performed. Vaginal cuff intact, no mucosal lesions. On bimanual exam there were no pelvic masses appreciated.  Lab Findings: Lab Results  Component Value Date   WBC 7.4 10/16/2021   HGB 14.3 10/16/2021   HCT 43.9 10/16/2021   MCV 89.4 10/16/2021   PLT 317 10/16/2021    Radiographic Findings: CT Abdomen Pelvis W Contrast  Result Date: 11/21/2021 CLINICAL DATA:  Staging uterine/cervical cancer. Recent robotic assisted total hysterectomy and bilateral salpingo oophorectomy. EXAM: CT ABDOMEN AND PELVIS WITH CONTRAST TECHNIQUE: Multidetector CT imaging of the abdomen and pelvis was performed using the standard protocol following bolus administration of intravenous contrast. RADIATION DOSE REDUCTION: This exam was performed according to the departmental dose-optimization program which includes automated exposure control, adjustment of the mA and/or kV according to patient size and/or use of iterative reconstruction technique. CONTRAST:  124mL OMNIPAQUE IOHEXOL 300 MG/ML  SOLN COMPARISON:  None Available. FINDINGS: Lower chest: The lung bases are clear of acute process. No pleural effusion or pulmonary lesions. The heart is normal in size. No pericardial effusion. The distal esophagus and aorta are unremarkable. Hepatobiliary: No hepatic lesions or evidence of peritoneal surface disease involving liver. No intrahepatic biliary dilatation. The gallbladder is surgically absent. No common bile duct dilatation. Pancreas: No mass, inflammation or ductal dilatation. Spleen: Normal size. No focal lesions. Adrenals/Urinary Tract: Adrenal glands normal. No renal lesions or hydronephrosis.  The bladder is unremarkable. Stomach/Bowel: The stomach, duodenum, small and colon unremarkable. No acute inflammatory process, mass lesions or obstructive findings. Sigmoid colon  diverticulosis noted. The terminal ileum and appendix are. Vascular/Lymphatic: Minimal aortic calcifications for age. No dissection. The branch vessels are patent. No mesenteric or retroperitoneal mass or adenopathy. No overt peritoneal or omental implants. However, there are a few small nodules noted in the anterior abdomen all measuring less than 5 mm. Four small nodules anterior to the left hepatic lobe. Recommend attention future studies. No pelvic lymphadenopathy. No inguinal lymphadenopathy. Reproductive: Surgically absent. Other: No ascites or abdominal wall hernia. No subcutaneous lesions. Musculoskeletal: No significant bony findings. IMPRESSION: 1. Status post hysterectomy and bilateral salpingo-oophorectomy. 2. There are a few small nodules noted in the anterior abdomen adjacent to the left hepatic lobe, all measuring less than 5 mm. Recommend attention on future studies. A follow-up CT abdomen/pelvis in 4-6 months is recommended. 3. Status post cholecystectomy without biliary dilatation. * Tracking Code: BO * Electronically Signed   By: Marijo Sanes M.D.   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 The patient will be treated with a *** cm diameter cylinder with a treatment length of *** cm. This distended the vaginal vault without undue discomfort. The patient tolerated the procedure well.  The patient was successfully fitted for a vaginal cylinder. The patient is appropriate to begin vaginal brachytherapy.   Plan: The patient will proceed with CT simulation and vaginal brachytherapy today.    _______________________________   Blair Promise, PhD, MD  This document serves as a record of services personally performed by Gery Pray, MD. It was created on his behalf by Roney Mans, a trained medical scribe. The creation of  this record is based on the scribe's personal observations and the provider's statements to them. This document has been checked and approved by the attending provider.

## 2021-12-16 ENCOUNTER — Ambulatory Visit
Admission: RE | Admit: 2021-12-16 | Discharge: 2021-12-16 | Disposition: A | Discharge status: Home / Self Care | Payer: Medicare PPO | Source: Ambulatory Visit | Attending: Radiation Oncology | Admitting: Radiation Oncology

## 2021-12-16 ENCOUNTER — Other Ambulatory Visit: Payer: Self-pay

## 2021-12-16 VITALS — BP 133/57 | HR 72 | Temp 97.7°F | Resp 20 | Ht 64.0 in | Wt 230.6 lb

## 2021-12-16 DIAGNOSIS — C541 Malignant neoplasm of endometrium: Secondary | ICD-10-CM

## 2021-12-16 LAB — RAD ONC ARIA SESSION SUMMARY
Course Elapsed Days: 0
Plan Fractions Treated to Date: 1
Plan Prescribed Dose Per Fraction: 6 Gy
Plan Total Fractions Prescribed: 5
Plan Total Prescribed Dose: 30 Gy
Reference Point Dosage Given to Date: 6 Gy
Reference Point Session Dosage Given: 6 Gy
Session Number: 1

## 2021-12-16 NOTE — Progress Notes (Signed)
Kylie Davis is here today for follow up.   Does the patient complain of any of the following:  Pain:no Abdominal bloating: no Diarrhea/Constipation: no Nausea/Vomiting: no Vaginal Discharge: no Blood in Urine or Stool: no Urinary Issues (dysuria/incomplete emptying/ incontinence/ increased frequency/urgency): no  Additional comments if applicable: None noted.  BP (!) 133/57 (BP Location: Right Arm, Patient Position: Sitting, Cuff Size: Large)   Pulse 72   Temp 97.7 F (36.5 C)   Resp 20   Ht '5\' 4"'$  (1.626 m)   Wt 230 lb 9.6 oz (104.6 kg)   SpO2 99%   BMI 39.58 kg/m

## 2021-12-22 ENCOUNTER — Telehealth: Payer: Self-pay | Admitting: Adult Health

## 2021-12-22 NOTE — Telephone Encounter (Signed)
Spoke to patient to schedule Medicare Annual Wellness Visit (AWV) either virtually or in office. Left  my Kylie Davis number [phone]   Last AWV ;12/18/20  please schedule at anytime with [facility] Nurse Health Advisor 1 or 2   Patient wanted call back on Tuesday 12/23/21

## 2021-12-23 ENCOUNTER — Telehealth: Payer: Self-pay | Admitting: *Deleted

## 2021-12-23 DIAGNOSIS — Z20822 Contact with and (suspected) exposure to covid-19: Secondary | ICD-10-CM | POA: Diagnosis not present

## 2021-12-23 NOTE — Telephone Encounter (Signed)
Called patient to remind of HDR Tx. for 12-24-21 @ 9 am, spoke with patient and she is aware of this appt.

## 2021-12-23 NOTE — Progress Notes (Signed)
  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 She will be treated with a 2.5 cm diameter segmented cylinder. This conforms to her anatomy without undue discomfort.  Vaginal brachytherapy procedure node: The patient was brought to the [place] suite. Identity was confirmed. All relevant records and images related to the planned course of therapy were reviewed. The patient freely provided informed written consent to proceed with treatment after reviewing the details related to the planned course of therapy. The consent form was witnessed and verified by the simulation staff. Then, the patient was set-up in a stable reproducible supine position for radiation therapy. Pelvic exam revealed the vaginal cuff to be intact . The patient's custom vaginal cylinder was placed in the proximal vagina. This was affixed to the CT/MR stabilization plate to prevent slippage. Patient tolerated the placement well.  Verification simulation note:  A fiducial marker was placed within the vaginal cylinder. An AP and lateral film was then obtained through the pelvis area. This documented accurate position of the vaginal cylinder for treatment.  HDR BRACHYTHERAPY TREATMENT  The remote afterloading device was affixed to the vaginal cylinder by catheter. Patient then proceeded to undergo her second high-dose-rate treatment directed at the proximal vagina. The patient was prescribed a dose of 6.0 gray to be delivered to the mucosal surface. Treatment length was 3.0 cm. Patient was treated with 1 channel using 7 dwell positions.  Treatment time was 161.2 seconds. Iridium 192 was the high-dose-rate source for treatment. The patient tolerated the treatment well. After completion of her therapy, a radiation survey was performed documenting return of the iridium source into the GammaMed safe.   PLAN: The patient will return next week for her third high-dose rate treatment.  ________________________________  -----------------------------------  Blair Promise, PhD, MD  This document serves as a record of services personally performed by Gery Pray, MD. It was created on his behalf by Roney Mans, a trained medical scribe. The creation of this record is based on the scribe's personal observations and the provider's statements to them. This document has been checked and approved by the attending provider.

## 2021-12-24 ENCOUNTER — Other Ambulatory Visit: Payer: Self-pay

## 2021-12-24 ENCOUNTER — Ambulatory Visit
Admission: RE | Admit: 2021-12-24 | Discharge: 2021-12-24 | Disposition: A | Discharge status: Home / Self Care | Payer: Medicare PPO | Source: Ambulatory Visit | Attending: Radiation Oncology | Admitting: Radiation Oncology

## 2021-12-24 DIAGNOSIS — C541 Malignant neoplasm of endometrium: Secondary | ICD-10-CM

## 2021-12-24 LAB — RAD ONC ARIA SESSION SUMMARY
Course Elapsed Days: 8
Plan Fractions Treated to Date: 2
Plan Prescribed Dose Per Fraction: 6 Gy
Plan Total Fractions Prescribed: 5
Plan Total Prescribed Dose: 30 Gy
Reference Point Dosage Given to Date: 12 Gy
Reference Point Session Dosage Given: 6 Gy
Session Number: 2

## 2021-12-25 DIAGNOSIS — Z20822 Contact with and (suspected) exposure to covid-19: Secondary | ICD-10-CM | POA: Diagnosis not present

## 2021-12-25 DIAGNOSIS — U071 COVID-19: Secondary | ICD-10-CM | POA: Diagnosis not present

## 2021-12-29 ENCOUNTER — Telehealth: Payer: Self-pay | Admitting: Oncology

## 2021-12-29 ENCOUNTER — Telehealth: Payer: Self-pay | Admitting: *Deleted

## 2021-12-29 NOTE — Telephone Encounter (Signed)
[place] said she tested positive for Covid on 12/25/21.  She did not have any symptoms except for a runny nose.  She is wondering when she is ok to come back to the [facility] for treatment.

## 2021-12-29 NOTE — Telephone Encounter (Signed)
CALLED PATIENT TO INFORM THAT SHE NEEDS TO WEAR A MASK TOMORROW WHEN SHE COMES FOR [facility]DR [place], TOMORROW 12/30/21, SPOKE WITH PATIENT AND SHE VERIFIED UNDERSTANDING THIS

## 2021-12-29 NOTE — Telephone Encounter (Signed)
CALLED PATIENT TO REMIND OF [facility]DR [place] FOR 12-30-21 @ 2 PM, SPOKE WITH PATIENT AND SHE IS AWARE OF THIS [place].

## 2021-12-29 NOTE — Progress Notes (Signed)
  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 She will be treated with a 2.5 cm diameter segmented cylinder. This conforms to her anatomy without undue discomfort.  Vaginal brachytherapy procedure node: The patient was brought to the [place] suite. Identity was confirmed. All relevant records and images related to the planned course of therapy were reviewed. The patient freely provided informed written consent to proceed with treatment after reviewing the details related to the planned course of therapy. The consent form was witnessed and verified by the simulation staff. Then, the patient was set-up in a stable reproducible supine position for radiation therapy. Pelvic exam revealed the vaginal cuff to be intact . The patient's custom vaginal cylinder was placed in the proximal vagina. This was affixed to the CT/MR stabilization plate to prevent slippage. Patient tolerated the placement well.  Verification simulation note:  A fiducial marker was placed within the vaginal cylinder. An AP and lateral film was then obtained through the pelvis area. This documented accurate position of the vaginal cylinder for treatment.  HDR BRACHYTHERAPY TREATMENT  The remote afterloading device was affixed to the vaginal cylinder by catheter. Patient then proceeded to undergo her third high-dose-rate treatment directed at the proximal vagina. The patient was prescribed a dose of 6.0 gray to be delivered to the mucosal surface. Treatment length was 3.0 cm. Patient was treated with 1 channel using 7 dwell positions.  Treatment time was 170.4 seconds. Iridium 192 was the high-dose-rate source for treatment. The patient tolerated the treatment well. After completion of her therapy, a radiation survey was performed documenting return of the iridium source into the GammaMed safe.   PLAN: The patient will return next week for her fourth high-dose rate treatment.  ________________________________  -----------------------------------  Blair Promise, PhD, MD  This document serves as a record of services personally performed by Gery Pray, MD. It was created on his behalf by Roney Mans, a trained medical scribe. The creation of this record is based on the scribe's personal observations and the provider's statements to them. This document has been checked and approved by the attending provider.

## 2021-12-30 ENCOUNTER — Other Ambulatory Visit: Payer: Self-pay

## 2021-12-30 ENCOUNTER — Ambulatory Visit
Admission: RE | Admit: 2021-12-30 | Discharge: 2021-12-30 | Disposition: A | Discharge status: Home / Self Care | Payer: Medicare PPO | Source: Ambulatory Visit | Attending: Radiation Oncology | Admitting: Radiation Oncology

## 2021-12-30 DIAGNOSIS — C541 Malignant neoplasm of endometrium: Secondary | ICD-10-CM | POA: Diagnosis not present

## 2021-12-30 LAB — RAD ONC ARIA SESSION SUMMARY
Course Elapsed Days: 14
Plan Fractions Treated to Date: 3
Plan Prescribed Dose Per Fraction: 6 Gy
Plan Total Fractions Prescribed: 5
Plan Total Prescribed Dose: 30 Gy
Reference Point Dosage Given to Date: 18 Gy
Reference Point Session Dosage Given: 6 Gy
Session Number: 3

## 2022-01-02 ENCOUNTER — Telehealth: Payer: Self-pay | Admitting: Genetic Counselor

## 2022-01-02 ENCOUNTER — Encounter: Payer: Self-pay | Admitting: Genetic Counselor

## 2022-01-02 ENCOUNTER — Ambulatory Visit: Payer: Self-pay | Admitting: Genetic Counselor

## 2022-01-02 DIAGNOSIS — Z1379 Encounter for other screening for genetic and chromosomal anomalies: Secondary | ICD-10-CM | POA: Insufficient documentation

## 2022-01-02 DIAGNOSIS — C541 Malignant neoplasm of endometrium: Secondary | ICD-10-CM

## 2022-01-02 NOTE — Telephone Encounter (Signed)
Revealed negative genetic testing.  Discussed that we do not know why she has endometrial cancer or why there is cancer in the family. It could be due to a different gene that we are not testing, or maybe our current technology may not be able to pick something up.  It will be important for her to keep in contact with genetics to keep up with whether additional testing may be needed.   RB1 VUS identified, but this will not change medical management.

## 2022-01-02 NOTE — Progress Notes (Signed)
HPI:  Kylie Davis was previously seen in the [place] clinic due to a personal and family history of cancer and concerns regarding a hereditary predisposition to cancer. Please refer to our prior cancer genetics clinic note for more information regarding our discussion, assessment and recommendations, at the time. Kylie Davis recent genetic test results were disclosed to her, as were recommendations warranted by these results. These results and recommendations are discussed in more detail below.  CANCER HISTORY:  Oncology History  Endometrial cancer ([place])  09/25/2021 Initial Biopsy   EMB - grade 1 endometrioid adenocarcinoma, MMR IHC intact   10/06/2021 Initial Diagnosis   Endometrial cancer ([place])   10/22/2021 Surgery   TRH/BSO, SLN bilaterally, LOA  Findings: On EUA, small mobile uterus. On intra-abdominal entry, normal upper abdominal survey. Some adhesions of the omentum to the left abdominal side wall in the upper abdomen. Some adhesions of the ascending colon to the right abdominal side wall and of the cecum to the right IP ligament. 8 cm uterus, normal in appearance. Normal appearing. Bilateral adnexa. No gross adenopathy. Mapping successful to bilateral obturator SLNs. Sigmoid colon adherent to the left IP ligament and left pelvic sidewall.   10/22/2021 Pathology Results   Stage IB, grade 1 MI 63% (difficult to determine given adenomyosis) LVI negative SLN on right negative, no definitive SLN seen in tissue excised on left (lymphoid cells negative for malignancy)   01/01/2022 Genetic Testing   Negative genetic testing on the CancerNext-Expanded+RNAinsight panel.  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  The report date is 01/01/2022.  The CancerNext-Expanded gene panel offered by [facility] and includes sequencing and rearrangement analysis for the following 77 genes: AIP, ALK, APC*, ATM*, AXIN2, BAP1, BARD1, BLM, BMPR1A, BRCA1*, BRCA2*, BRIP1*, CDC73, CDH1*, CDK4,  CDKN1B, CDKN2A, CHEK2*, CTNNA1, DICER1, FANCC, FH, FLCN, GALNT12, KIF1B, LZTR1, MAX, MEN1, MET, MLH1*, MSH2*, MSH3, MSH6*, MUTYH*, NBN, NF1*, NF2, NTHL1, PALB2*, PHOX2B, PMS2*, POT1, PRKAR1A, PTCH1, PTEN*, RAD51C*, RAD51D*, RB1, RECQL, RET, SDHA, SDHAF2, SDHB, SDHC, SDHD, SMAD4, SMARCA4, SMARCB1, SMARCE1, STK11, SUFU, TMEM127, TP53*, TSC1, TSC2, VHL and XRCC2 (sequencing and deletion/duplication); EGFR, EGLN1, HOXB13, KIT, MITF, PDGFRA, POLD1, and POLE (sequencing only); EPCAM and GREM1 (deletion/duplication only). DNA and RNA analyses performed for * genes.      FAMILY HISTORY:  We obtained a detailed, 4-generation family history.  Significant diagnoses are listed below: Family History  Problem Relation Age of Onset   Cancer Mother 18       primary peritoneal cancer   Alzheimer's disease Mother    Heart disease Father    Pancreatic cancer Father 46   Hypertension Brother    Heart attack Brother 73       MI x 2   Breast cancer Paternal Aunt 84   Diabetes Paternal Grandmother    Colon cancer Neg Hx    Prostate cancer Neg Hx    Ovarian cancer Neg Hx      The patient does not have children.  She has one brother who is cancer free.  Both parents are deceased.   The patient's father died of pancreatic cancer. He had 13 siblings, one had breast cancer.  There is no other reported family history of cancer.   The patient's mother had peritoneal cancer at 64.  She had three sisters and a brother who are cancer free.  There is no other reported family history of cancer.   Kylie Davis is aware of previous family history of genetic testing for hereditary cancer  risks. Patient's maternal ancestors are of Caucasian descent, and paternal ancestors are of [place] descent. There is no reported Ashkenazi Jewish ancestry. There is no known consanguinity.  GENETIC TEST RESULTS: Genetic testing reported out on January 01, 2022 through the CancerNext-Expanded+RNAinsight cancer panel found no pathogenic  mutations. The CancerNext-Expanded gene panel offered by [facility] and includes sequencing and rearrangement analysis for the following 77 genes: AIP, ALK, APC*, ATM*, AXIN2, BAP1, BARD1, BLM, BMPR1A, BRCA1*, BRCA2*, BRIP1*, CDC73, CDH1*, CDK4, CDKN1B, CDKN2A, CHEK2*, CTNNA1, DICER1, FANCC, FH, FLCN, GALNT12, KIF1B, LZTR1, MAX, MEN1, MET, MLH1*, MSH2*, MSH3, MSH6*, MUTYH*, NBN, NF1*, NF2, NTHL1, PALB2*, PHOX2B, PMS2*, POT1, PRKAR1A, PTCH1, PTEN*, RAD51C*, RAD51D*, RB1, RECQL, RET, SDHA, SDHAF2, SDHB, SDHC, SDHD, SMAD4, SMARCA4, SMARCB1, SMARCE1, STK11, SUFU, TMEM127, TP53*, TSC1, TSC2, VHL and XRCC2 (sequencing and deletion/duplication); EGFR, EGLN1, HOXB13, KIT, MITF, PDGFRA, POLD1, and POLE (sequencing only); EPCAM and GREM1 (deletion/duplication only). DNA and RNA analyses performed for * genes. The test report has been scanned into EPIC and is located under the Molecular Pathology section of the Results Review tab.  A portion of the result report is included below for reference.     We discussed with Kylie Davis that because current genetic testing is not perfect, it is possible there may be a gene mutation in one of these genes that current testing cannot detect, but that chance is small.  We also discussed, that there could be another gene that has not yet been discovered, or that we have not yet tested, that is responsible for the cancer diagnoses in the family. It is also possible there is a hereditary cause for the cancer in the family that Kylie Davis did not inherit and therefore was not identified in her testing.  Therefore, it is important to remain in touch with cancer genetics in the future so that we can continue to offer Kylie Davis the most up to date genetic testing.   Genetic testing did identify a variant of uncertain significance (VUS) was identified in the RB1 gene called p.M605T (c.1814T>C).  At this time, it is unknown if this variant is associated with increased cancer risk or if  this is a normal finding, but most variants such as this get reclassified to being inconsequential. It should not be used to make medical management decisions. With time, we suspect the lab will determine the significance of this variant, if any. If we do learn more about it, we will try to contact Kylie Davis to discuss it further. However, it is important to stay in touch with [place] periodically and keep the address and phone number up to date.  ADDITIONAL GENETIC TESTING: We discussed with Kylie Davis that her genetic testing was fairly extensive.  If there are genes identified to increase cancer risk that can be analyzed in the future, we would be happy to discuss and coordinate this testing at that time.    CANCER SCREENING RECOMMENDATIONS: Kylie Davis test result is considered negative (normal).  This means that we have not identified a hereditary cause for her personal and family history of cancer at this time. Most cancers happen by chance and this negative test suggests that her cancer may fall into this category.    While reassuring, this does not definitively rule out a hereditary predisposition to cancer. It is still possible that there could be genetic mutations that are undetectable by current technology. There could be genetic mutations in genes that have not been tested or identified to increase cancer risk.  Therefore, it is recommended she continue to follow the cancer management and screening guidelines provided by her oncology and primary healthcare provider.   An individual's cancer risk and medical management are not determined by genetic test results alone. Overall cancer risk assessment incorporates additional factors, including personal medical history, family history, and any available genetic information that may result in a personalized plan for cancer prevention and surveillance  RECOMMENDATIONS FOR FAMILY MEMBERS:  Individuals in this family might be at some increased risk of  developing cancer, over the general population risk, simply due to the family history of cancer.  We recommended women in this family have a yearly mammogram beginning at age 4, or 97 years younger than the earliest onset of cancer, an annual clinical breast exam, and perform monthly breast self-exams. Women in this family should also have a gynecological exam as recommended by their primary provider. All family members should be referred for colonoscopy starting at age 42.  FOLLOW-UP: Lastly, we discussed with Kylie Davis that cancer genetics is a rapidly advancing field and it is possible that new genetic tests will be appropriate for her and/or her family members in the future. We encouraged her to remain in contact with cancer genetics on an annual basis so we can update her personal and family histories and let her know of advances in cancer genetics that may benefit this family.   Our contact number was provided. Ms. Hockenbury questions were answered to her satisfaction, and she knows she is welcome to call us at anytime with additional questions or concerns.   Roma Kayser, [place], [facility] Licensed, Certified Genetic Counselor [email or web address]

## 2022-01-07 ENCOUNTER — Telehealth: Payer: Self-pay | Admitting: *Deleted

## 2022-01-07 NOTE — Progress Notes (Signed)
  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 She will be treated with a 2.5 cm diameter segmented cylinder. This conforms to her anatomy without undue discomfort.  Vaginal brachytherapy procedure node: The patient was brought to the [place] suite. Identity was confirmed. All relevant records and images related to the planned course of therapy were reviewed. The patient freely provided informed written consent to proceed with treatment after reviewing the details related to the planned course of therapy. The consent form was witnessed and verified by the simulation staff. Then, the patient was set-up in a stable reproducible supine position for radiation therapy. Pelvic exam revealed the vaginal cuff to be intact . The patient's custom vaginal cylinder was placed in the proximal vagina. This was affixed to the CT/MR stabilization plate to prevent slippage. Patient tolerated the placement well.  Verification simulation note:  A fiducial marker was placed within the vaginal cylinder. An AP and lateral film was then obtained through the pelvis area. This documented accurate position of the vaginal cylinder for treatment.  HDR BRACHYTHERAPY TREATMENT  The remote afterloading device was affixed to the vaginal cylinder by catheter. Patient then proceeded to undergo her fourth high-dose-rate treatment directed at the proximal vagina. The patient was prescribed a dose of 6.0 gray to be delivered to the mucosal surface. Treatment length was 3.0 cm. Patient was treated with 1 channel using 7 dwell positions.  Treatment time was 185.3 seconds. Iridium 192 was the high-dose-rate source for treatment. The patient tolerated the treatment well. After completion of her therapy, a radiation survey was performed documenting return of the iridium source into the GammaMed safe.   PLAN: The patient will return next week for her fifth high-dose rate treatment.  ________________________________  -----------------------------------  Blair Promise, PhD, MD  This document serves as a record of services personally performed by Gery Pray, MD. It was created on his behalf by Roney Mans, a trained medical scribe. The creation of this record is based on the scribe's personal observations and the provider's statements to them. This document has been checked and approved by the attending provider.

## 2022-01-07 NOTE — Telephone Encounter (Signed)
CALLED PATIENT TO REMIND OF HDR [place] 01-08-22 @ 2 PM, LVM FOR A RETURN CALL

## 2022-01-08 ENCOUNTER — Other Ambulatory Visit: Payer: Self-pay

## 2022-01-08 ENCOUNTER — Ambulatory Visit
Admission: RE | Admit: 2022-01-08 | Discharge: 2022-01-08 | Disposition: A | Discharge status: Home / Self Care | Payer: Medicare PPO | Source: Ambulatory Visit | Attending: Radiation Oncology | Admitting: Radiation Oncology

## 2022-01-08 DIAGNOSIS — C541 Malignant neoplasm of endometrium: Secondary | ICD-10-CM

## 2022-01-08 LAB — RAD ONC ARIA SESSION SUMMARY
Course Elapsed Days: 23
Plan Fractions Treated to Date: 4
Plan Prescribed Dose Per Fraction: 6 Gy
Plan Total Fractions Prescribed: 5
Plan Total Prescribed Dose: 30 Gy
Reference Point Dosage Given to Date: 24 Gy
Reference Point Session Dosage Given: 6 Gy
Session Number: 4

## 2022-01-13 ENCOUNTER — Ambulatory Visit (INDEPENDENT_AMBULATORY_CARE_PROVIDER_SITE_OTHER): Payer: Medicare PPO

## 2022-01-13 VITALS — BP 116/70 | HR 79 | Temp 97.9°F | Ht 64.0 in | Wt 232.5 lb

## 2022-01-13 DIAGNOSIS — Z0000 Encounter for general adult medical examination without abnormal findings: Secondary | ICD-10-CM | POA: Diagnosis not present

## 2022-01-13 NOTE — Progress Notes (Signed)
Subjective:   Kylie Davis is a 71 y.o. female who presents for Medicare Annual (Subsequent) preventive examination.  Review of Systems     Cardiac Risk Factors include: advanced age (>2mn, >>57women);hypertension;obesity (BMI >30kg/m2)     Objective:    Today's Vitals   01/13/22 0855  BP: 116/70  Pulse: 79  Temp: 97.9 F (36.6 C)  TempSrc: Oral  SpO2: 96%  Weight: 232 lb 8 oz (105.5 kg)  Height: '5\' 4"'$  (1.626 m)   Body mass index is 39.91 kg/m.     01/13/2022    9:06 AM 12/16/2021    9:07 AM 12/08/2021   12:26 PM 10/16/2021    1:02 PM 10/03/2021    1:44 PM 12/18/2020    2:06 PM 01/31/2020    1:16 PM  Advanced Directives  Does Patient Have a Medical Advance Directive? Yes Yes Yes Yes Yes Yes No  Type of AParamedicof ASouth HillLiving will HSpen[place]Living will HAnton Ch[place]Living will HCrossnoreLiving will HScofieldLiving will HMaunieLiving will   Does patient want to make changes to medical advance directive?     No - Patient declined    Copy of [place] Chart? No - copy requested     No - copy requested   Would patient like information on creating a medical advance directive?       No - Patient declined    Current Medications (verified) Outpatient Encounter Medications as of 01/13/2022  Medication Sig   acetaminophen (TYLENOL) 500 MG tablet Take 500-1,000 mg by mouth every 6 (six) hours as needed (pain.).   aspirin EC 81 MG tablet Take 81 mg by mouth in the morning.   Cholecalciferol (VITAMIN D3 PO) Take 1 tablet by mouth 2 (two) times a week.   ibuprofen (ADVIL) 600 MG tablet Take 1 tablet (600 mg total) by mouth every 6 (six) hours as needed for moderate pain. For AFTER surgery only   losartan-hydrochlorothiazide (HYZAAR) 50-12.5 MG tablet TAKE 1 TABLET BY MOUTH DAILY   TRULICITY 03.50MKX/3.8HWSOPN ADMINISTER 0.75 MG UNDER  THE SKIN 1 TIME A WEEK (Patient taking differently: Inject 0.75 mg into the skin every Wednesday.)   Continuous Blood Gluc Sensor (FREESTYLE LIBRE 3 SENSOR) MISC 1 Device by Does not apply route every 14 (fourteen) days. Place 1 sensor on the skin every 14 days. Use to check glucose continuously (Patient not taking: Reported on 01/13/2022)   No facility-administered encounter medications on file as of 01/13/2022.    Allergies (verified) Patient has no known allergies.   History: Past Medical History:  Diagnosis Date   Arthritis    BMI 39.0-39.9,adult    Diabetes (H[place]app    on Trulicity   Family history of pancreatic cancer    Family history of peritoneal cancer    Hypertension    PONV (postoperative nausea and vomiting)    Sleep apnea    C PAP   Past Surgical History:  Procedure Laterality Date   CHOLECYSTECTOMY  2004   LYMPH NODE BIOPSY N/A 10/22/2021   Procedure: Sentinel LYMPH NODE BIOPSY;  Surgeon: TLafonda Mosses MD;  Location: [facility];  Service: Gynecology;  Laterality: N/A;   ROBOTIC ASSISTED TOTAL HYSTERECTOMY WITH BILATERAL SALPINGO OOPHERECTOMY Bilateral 10/22/2021   Procedure: XI ROBOTIC ASSISTED TOTAL HYSTERECTOMY WITH BILATERAL SALPINGO OOPHORECTOMY;  Surgeon: TLafonda Mosses MD;  Location: [facility];  Service: Gynecology;  Laterality:  Bilateral;   Family History  Problem Relation Age of Onset   Cancer Mother 54       primary peritoneal cancer   Alzheimer's disease Mother    Heart disease Father    Pancreatic cancer Father 74   Hypertension Brother    Heart attack Brother 57       MI x 2   Breast cancer Paternal Aunt 38   Diabetes Paternal Grandmother    Colon cancer Neg Hx    Prostate cancer Neg Hx    Ovarian cancer Neg Hx    Social History   Socioeconomic History   Marital status: Married    Spouse name: Not on file   Number of children: Not on file   Years of education: Not on file   Highest education level: Not on file  Occupational History    Occupation: retired Product manager: RETIRED  Tobacco Use   Smoking status: Never   Smokeless tobacco: Never  Vaping Use   Vaping Use: Never used  Substance and Sexual Activity   Alcohol use: Yes    Alcohol/week: 4.0 standard drinks of alcohol    Types: 4 Standard drinks or equivalent per week    Comment: occas   Drug use: No   Sexual activity: Not Currently    Birth control/protection: Post-menopausal    Comment: 1st intercourse 71 yo.---Fewer than 5 partners  Other Topics Concern   Not on file  Social History Narrative   Retired from being a Education officer, museum.    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 years    0 children    Social Determinants of Health   Financial Resource Strain: Low Risk  (01/13/2022)   Overall Financial Resource Strain (CARDIA)    Difficulty of Paying Living Expenses: Not hard at all  Food Insecurity: No Food Insecurity (01/13/2022)   Hunger Vital Sign    Worried About Running Out of Food in the Last Year: Never true    Ran Out of Food in the Last Year: Never true  Transportation Needs: No Transportation Needs (01/13/2022)   PRAPARE - Hydrologist (Medical): No    Lack of Transportation (Non-Medical): No  Physical Activity: Inactive (01/13/2022)   Exercise Vital Sign    Days of Exercise per Week: 0 days    Minutes of Exercise per Session: 0 min  Stress: No Stress Concern Present (01/13/2022)   Sunflower    Feeling of Stress : Not at all  Social Connections: [place] (12/18/2020)   Social Connection and Isolation Panel [NHANES]    Frequency of Communication with Friends and Family: Three times a week    Frequency of Social Gatherings with Friends and Family: Three times a week    Attends Religious Services: More than 4 times per year    Active Member of Clubs or Organizations: Yes    Attends Music therapist: More than 4 times per year    Marital Status:  Married    Tobacco Counseling Counseling given: Not Answered   Clinical Intake:  Pre-visit preparation completed: Yes  Pain : No/denies pain     Nutritional Status: BMI > 30  Obese Nutritional Risks: None Diabetes: No  How often do you need to have someone help you when you read instructions, pamphlets, or other written materials from your doctor or pharmacy?: 1 - Never What is the last grade level you completed in school?: masters degree  Diabetic? no  Interpreter Needed?: No  Information entered by :: NAllen LPN   Activities of Daily Living    01/13/2022    9:08 AM 10/16/2021    1:04 PM  In your present state of health, do you have any difficulty performing the following activities:  Hearing? 0   Vision? 0   Difficulty concentrating or making decisions? 0   Walking or climbing stairs? 0   Dressing or bathing? 0   Doing errands, shopping? 0 0  Preparing Food and eating ? N   Using the Toilet? N   In the past six months, have you accidently leaked urine? Y   Comment since the surgery   Do you have problems with loss of bowel control? N   Managing your Medications? N   Managing your Finances? N   Housekeeping or managing your Housekeeping? N     Patient Care Team: [name], NP as PCP - General (Family Medicine)  Indicate any recent Medical Services you may have received from other than [facility] providers in the past year (date may be approximate).     Assessment:   This is a routine wellness examination for Lekia.  Hearing/Vision screen Vision Screening - Comments:: Regular eye exams,   Dietary issues and exercise activities discussed: Current Exercise Habits: The patient does not participate in regular exercise at present   Goals Addressed             This Visit's Progress    Patient Stated       01/13/2022, wants to build up endurance       Depression Screen    01/13/2022    9:08 AM 04/08/2021    1:54 PM 12/18/2020    2:10 PM 12/25/2019    10:41 AM 03/08/2018    8:05 AM 03/03/2017    8:32 AM 02/28/2015    1:25 PM  PHQ 2/9 Scores  PHQ - 2 Score 0 0 0 0 0 0 0  PHQ- 9 Score  2  0       Fall Risk    01/13/2022    9:07 AM 04/08/2021    1:54 PM 12/18/2020    2:10 PM 12/25/2019   10:39 AM 03/08/2018    8:05 AM  Fall Risk   Falls in the past year? 0 0 0 0 No  Number falls in past yr: 0 0 0 0   Injury with Fall? 0 0 0 0   Risk for fall due to : Medication side effect   Medication side effect;Orthopedic patient   Follow up Falls evaluation completed;Education provided;Falls prevention discussed  Falls evaluation completed Falls evaluation completed;Falls prevention discussed     FALL RISK PREVENTION PERTAINING TO THE HOME:  Any stairs in or around the home? No  If so, are there any without handrails? N/a Home free of loose throw rugs in walkways, pet beds, electrical cords, etc? Yes  Adequate lighting in your home to reduce risk of falls? Yes   ASSISTIVE DEVICES UTILIZED TO PREVENT FALLS:  Life alert? No  Use of a cane, walker or w/c? No  Grab bars in the bathroom? Yes  Shower chair or bench in shower? No  Elevated toilet seat or a handicapped toilet? Yes   TIMED UP AND GO:  Was the test performed? No .    Gait steady and fast without use of assistive device  Cognitive Function:        01/13/2022    9:09 AM  6CIT  Screen  What Year? 0 points  What month? 0 points  What time? 0 points  Count back from 20 0 points  Months in reverse 2 points  Repeat phrase 4 points  Total Score 6 points    Immunizations Immunization History  Administered Date(s) Administered   Influenza Split 02/25/2012, 03/18/2013, 02/02/2014   Influenza, High Dose Seasonal PF 02/28/2015, 02/20/2016, 03/03/2017, 03/08/2018, 03/17/2019   Moderna SARS-COV2 Booster Vaccination 05/10/2020   Moderna Sars-Covid-2 Vaccination 07/22/2019, 08/22/2019   Pneumococcal Conjugate-13 02/28/2015   Pneumococcal Polysaccharide-23 02/20/2016   Td  05/19/2003   Tdap 09/13/2013   Zoster Recombinat (Shingrix) 06/25/2018, 11/06/2018   Zoster, Live 12/15/2010    TDAP status: Up to date  Flu Vaccine status: Due, Education has been provided regarding the importance of this vaccine. Advised may receive this vaccine at local pharmacy or Health Dept. Aware to provide a copy of the vaccination record if obtained from local pharmacy or Health Dept. Verbalized acceptance and understanding.  Pneumococcal vaccine status: Up to date  Covid-19 vaccine status: Completed vaccines  Qualifies for Shingles Vaccine? Yes   Zostavax completed Yes   Shingrix Completed?: Yes  Screening Tests Health Maintenance  Topic Date Due   OPHTHALMOLOGY EXAM  Never done   COVID-19 Vaccine (3 - Moderna risk series) 06/07/2020   INFLUENZA VACCINE  12/16/2021   HEMOGLOBIN A1C  04/17/2022   MAMMOGRAM  05/07/2022   COLONOSCOPY (Pts 45-70yr Insurance coverage will need to be confirmed)  02/25/2023   TETANUS/TDAP  09/14/2023   Pneumonia Vaccine 72 Years old  Completed   DEXA SCAN  Completed   Hepatitis C Screening  Completed   Zoster Vaccines- Shingrix  Completed   HPV VACCINES  Aged Out   FOOT EXAM  Discontinued    Health Maintenance  Health Maintenance Due  Topic Date Due   OPHTHALMOLOGY EXAM  Never done   COVID-19 Vaccine (3 - Moderna risk series) 06/07/2020   INFLUENZA VACCINE  12/16/2021    Colorectal cancer screening: Type of screening: Colonoscopy. Completed 02/24/2013. Repeat every 10 years  Mammogram status: Completed 05/07/2021. Repeat every year  Bone Density status: Completed 04/21/2016.  Lung Cancer Screening: (Low Dose CT Chest recommended if Age 72-80years, 30 pack-year currently smoking OR have quit w/in 15years.) does not qualify.   Lung Cancer Screening Referral: no  Additional Screening:  Hepatitis C Screening: does qualify; Completed 03/03/2017  Vision Screening: Recommended annual ophthalmology exams for early detection of  glaucoma and other disorders of the eye. Is the patient up to date with their annual eye exam?  Yes  Who is the provider or what is the name of the office in which the patient attends annual eye exams? Can't remember name If pt is not established with a provider, would they like to be referred to a provider to establish care? No .   Dental Screening: Recommended annual dental exams for proper oral hygiene  Community Resource Referral / Chronic Care Management: CRR required this visit?  No   CCM required this visit?  No      Plan:     I have personally reviewed and noted the following in the patient's chart:   Medical and social history Use of alcohol, tobacco or illicit drugs  Current medications and supplements including opioid prescriptions. Patient is not currently taking opioid prescriptions. Functional ability and status Nutritional status Physical activity Advanced directives List of other physicians Hospitalizations, surgeries, and ER visits in previous 12 months Vitals Screenings to include cognitive,  depression, and falls Referrals and appointments  In addition, I have reviewed and discussed with patient certain preventive protocols, quality metrics, and best practice recommendations. A written personalized care plan for preventive services as well as general preventive health recommendations were provided to patient.     Kellie Simmering, LPN   [id number]   Nurse Notes: none

## 2022-01-13 NOTE — Progress Notes (Signed)
  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 She will be treated with a 2.5 cm diameter segmented cylinder. This conforms to her anatomy without undue discomfort.  Vaginal brachytherapy procedure node: The patient was brought to the [place] suite. Identity was confirmed. All relevant records and images related to the planned course of therapy were reviewed. The patient freely provided informed written consent to proceed with treatment after reviewing the details related to the planned course of therapy. The consent form was witnessed and verified by the simulation staff. Then, the patient was set-up in a stable reproducible supine position for radiation therapy. Pelvic exam revealed the vaginal cuff to be intact . The patient's custom vaginal cylinder was placed in the proximal vagina. This was affixed to the CT/MR stabilization plate to prevent slippage. Patient tolerated the placement well.  Verification simulation note:  A fiducial marker was placed within the vaginal cylinder. An AP and lateral film was then obtained through the pelvis area. This documented accurate position of the vaginal cylinder for treatment.  HDR BRACHYTHERAPY TREATMENT  The remote afterloading device was affixed to the vaginal cylinder by catheter. Patient then proceeded to undergo her fifth high-dose-rate treatment directed at the proximal vagina. The patient was prescribed a dose of 6.0 gray to be delivered to the mucosal surface. Treatment length was 3.0 cm. Patient was treated with 1 channel using 7 dwell positions.  Treatment time was 196.1 seconds. Iridium 192 was the high-dose-rate source for treatment. The patient tolerated the treatment well. After completion of her therapy, a radiation survey was performed documenting return of the iridium source into the [employer] safe.   PLAN: The patient has completed her fifth and final high-dose rate treatment. She tolerated treatment well overall without any significant side effects . She will return for follow-up in one month.  ________________________________  -----------------------------------  Blair Promise, PhD, MD  This document serves as a record of services personally performed by Gery Pray, MD. It was created on his behalf by Roney Mans, a trained medical scribe. The creation of this record is based on the scribe's personal observations and the provider's statements to them. This document has been checked and approved by the attending provider.

## 2022-01-13 NOTE — Patient Instructions (Signed)
Kylie Davis , Thank you for taking time to come for your Medicare Wellness Visit. I appreciate your ongoing commitment to your health goals. Please review the following plan we discussed and let me know if I can assist you in the future.   Screening recommendations/referrals: Colonoscopy: 02/24/2013, due 02/25/2023 Mammogram: completed 05/07/2021, due 05/08/2022 Bone Density: completed 04/21/2016 Recommended yearly ophthalmology/optometry visit for glaucoma screening and checkup Recommended yearly dental visit for hygiene and checkup  Vaccinations: Influenza vaccine: due Pneumococcal vaccine: completed 02/20/2016 Tdap vaccine: completed 09/13/2013, due 09/14/2023 Shingles vaccine: completed   Covid-19: 05/10/2020, 08/22/2019, 07/22/2019  Advanced directives: Please bring a copy of your POA (Power of Attorney) and/or Living Will to your next appointment.   Conditions/risks identified: none  Next appointment: Follow up in one year for your annual wellness visit    Preventive Care 65 Years and Older, Female Preventive care refers to lifestyle choices and visits with your health care provider that can promote health and wellness. What does preventive care include? A yearly physical exam. This is also called an annual well check. Dental exams once or twice a year. Routine eye exams. Ask your health care provider how often you should have your eyes checked. Personal lifestyle choices, including: Daily care of your teeth and gums. Regular physical activity. Eating a healthy diet. Avoiding tobacco and drug use. Limiting alcohol use. Practicing safe sex. Taking low-dose aspirin every day. Taking vitamin and mineral supplements as recommended by your health care provider. What happens during an annual well check? The services and screenings done by your health care provider during your annual well check will depend on your age, overall health, lifestyle risk factors, and family history of  disease. Counseling  Your health care provider may ask you questions about your: Alcohol use. Tobacco use. Drug use. Emotional well-being. Home and relationship well-being. Sexual activity. Eating habits. History of falls. Memory and ability to understand (cognition). Work and work Statistician. Reproductive health. Screening  You may have the following tests or measurements: Height, weight, and BMI. Blood pressure. Lipid and cholesterol levels. These may be checked every 5 years, or more frequently if you are over 45 years old. Skin check. Lung cancer screening. You may have this screening every year starting at age 21 if you have a 30-pack-year history of smoking and currently smoke or have quit within the past 15 years. Fecal occult blood test (FOBT) of the stool. You may have this test every year starting at age 25. Flexible sigmoidoscopy or colonoscopy. You may have a sigmoidoscopy every 5 years or a colonoscopy every 10 years starting at age 26. Hepatitis C blood test. Hepatitis B blood test. Sexually transmitted disease (STD) testing. Diabetes screening. This is done by checking your blood sugar (glucose) after you have not eaten for a while (fasting). You may have this done every 1-3 years. Bone density scan. This is done to screen for osteoporosis. You may have this done starting at age 85. Mammogram. This may be done every 1-2 years. Talk to your health care provider about how often you should have regular mammograms. Talk with your health care provider about your test results, treatment options, and if necessary, the need for more tests. Vaccines  Your health care provider may recommend certain vaccines, such as: Influenza vaccine. This is recommended every year. Tetanus, diphtheria, and acellular pertussis (Tdap, Td) vaccine. You may need a Td booster every 10 years. Zoster vaccine. You may need this after age 72. Pneumococcal 13-valent conjugate (PCV13) vaccine.  One  dose is recommended after age 6. Pneumococcal polysaccharide (PPSV23) vaccine. One dose is recommended after age 77. Talk to your health care provider about which screenings and vaccines you need and how often you need them. This information is not intended to replace advice given to you by your health care provider. Make sure you discuss any questions you have with your health care provider. Document Released: 05/31/2015 Document Revised: 01/22/2016 Document Reviewed: 03/05/2015 Elsevier Interactive Patient Education  2017 [place] Prevention in the Home Falls can cause injuries. They can happen to people of all ages. There are many things you can do to make your home safe and to help prevent falls. What can I do on the outside of my home? Regularly fix the edges of walkways and driveways and fix any cracks. Remove anything that might make you trip as you walk through a door, such as a raised step or threshold. Trim any bushes or trees on the path to your home. Use bright outdoor lighting. Clear any walking paths of anything that might make someone trip, such as rocks or tools. Regularly check to see if handrails are loose or broken. Make sure that both sides of any steps have handrails. Any raised decks and porches should have guardrails on the edges. Have any leaves, snow, or ice cleared regularly. Use sand or salt on walking paths during winter. Clean up any spills in your garage right away. This includes oil or grease spills. What can I do in the bathroom? Use night lights. Install grab bars by the toilet and in the tub and shower. Do not use towel bars as grab bars. Use non-skid mats or decals in the tub or shower. If you need to sit down in the shower, use a plastic, non-slip stool. Keep the floor dry. Clean up any water that spills on the floor as soon as it happens. Remove soap buildup in the tub or shower regularly. Attach bath mats securely with double-sided  non-slip rug tape. Do not have throw rugs and other things on the floor that can make you trip. What can I do in the bedroom? Use night lights. Make sure that you have a light by your bed that is easy to reach. Do not use any sheets or blankets that are too big for your bed. They should not hang down onto the floor. Have a firm chair that has side arms. You can use this for support while you get dressed. Do not have throw rugs and other things on the floor that can make you trip. What can I do in the kitchen? Clean up any spills right away. Avoid walking on wet floors. Keep items that you use a lot in easy-to-reach places. If you need to reach something above you, use a strong step stool that has a grab bar. Keep electrical cords out of the way. Do not use floor polish or wax that makes floors slippery. If you must use wax, use non-skid floor wax. Do not have throw rugs and other things on the floor that can make you trip. What can I do with my stairs? Do not leave any items on the stairs. Make sure that there are handrails on both sides of the stairs and use them. Fix handrails that are broken or loose. Make sure that handrails are as long as the stairways. Check any carpeting to make sure that it is firmly attached to the stairs. Fix any carpet that is loose or  worn. Avoid having throw rugs at the top or bottom of the stairs. If you do have throw rugs, attach them to the floor with carpet tape. Make sure that you have a light switch at the top of the stairs and the bottom of the stairs. If you do not have them, ask someone to add them for you. What else can I do to help prevent falls? Wear shoes that: Do not have high heels. Have rubber bottoms. Are comfortable and fit you well. Are closed at the toe. Do not wear sandals. If you use a stepladder: Make sure that it is fully opened. Do not climb a closed stepladder. Make sure that both sides of the stepladder are locked into place. Ask  someone to hold it for you, if possible. Clearly mark and make sure that you can see: Any grab bars or handrails. First and last steps. Where the edge of each step is. Use tools that help you move around (mobility aids) if they are needed. These include: Canes. Walkers. Scooters. Crutches. Turn on the lights when you go into a dark area. Replace any light bulbs as soon as they burn out. Set up your furniture so you have a clear path. Avoid moving your furniture around. If any of your floors are uneven, fix them. If there are any pets around you, be aware of where they are. Review your medicines with your doctor. Some medicines can make you feel dizzy. This can increase your chance of falling. Ask your doctor what other things that you can do to help prevent falls. This information is not intended to replace advice given to you by your health care provider. Make sure you discuss any questions you have with your health care provider. Document Released: 02/28/2009 Document Revised: 10/10/2015 Document Reviewed: 06/08/2014 Elsevier Interactive Patient Education  2017 [employer].

## 2022-01-14 ENCOUNTER — Encounter: Payer: Self-pay | Admitting: Radiation Oncology

## 2022-01-14 ENCOUNTER — Other Ambulatory Visit: Payer: Self-pay

## 2022-01-14 ENCOUNTER — Ambulatory Visit
Admission: RE | Admit: 2022-01-14 | Discharge: 2022-01-14 | Disposition: A | Discharge status: Home / Self Care | Payer: Medicare PPO | Source: Ambulatory Visit | Attending: Radiation Oncology | Admitting: Radiation Oncology

## 2022-01-14 DIAGNOSIS — C541 Malignant neoplasm of endometrium: Secondary | ICD-10-CM

## 2022-01-14 LAB — RAD ONC ARIA SESSION SUMMARY
Course Elapsed Days: 29
Plan Fractions Treated to Date: 5
Plan Prescribed Dose Per Fraction: 6 Gy
Plan Total Fractions Prescribed: 5
Plan Total Prescribed Dose: 30 Gy
Reference Point Dosage Given to Date: 30 Gy
Reference Point Session Dosage Given: 6 Gy
Session Number: 5

## 2022-01-27 ENCOUNTER — Ambulatory Visit: Payer: Medicare PPO | Admitting: Adult Health

## 2022-01-27 ENCOUNTER — Encounter: Payer: Self-pay | Admitting: Adult Health

## 2022-01-27 VITALS — BP 110/80 | HR 70 | Temp 98.6°F | Ht 64.0 in | Wt 229.0 lb

## 2022-01-27 DIAGNOSIS — R7303 Prediabetes: Secondary | ICD-10-CM | POA: Diagnosis not present

## 2022-01-27 DIAGNOSIS — I10 Essential (primary) hypertension: Secondary | ICD-10-CM | POA: Diagnosis not present

## 2022-01-27 MED ORDER — TRULICITY 1.5 MG/0.5ML ~~LOC~~ SOAJ: 1.5000 mg | SUBCUTANEOUS | 1 refills | Status: AC

## 2022-01-27 MED ORDER — LOSARTAN POTASSIUM-HCTZ 50-12.5 MG PO TABS: 1.0000 | ORAL_TABLET | Freq: Every day | ORAL | 1 refills | Status: DC

## 2022-01-27 NOTE — Progress Notes (Signed)
Subjective:    Patient ID: Kylie Davis, female    DOB: Dec 11, 1950, 71 y.o.   MRN: [id number]  HPI 71 year old female who  has a past medical history of Arthritis, BMI 39.0-39.9,adult, Diabetes ([place]), Family history of pancreatic cancer, Family history of peritoneal cancer, Hypertension, PONV (postoperative nausea and vomiting), and Sleep apnea.  She presents to the office today for follow up regarding prediabtes and HTN   Pre Diabetes - she is currently maintained on Trulicity 7.16 mg weekly and has been on this dose for close to a year. She had her last A1c done 3 months ago with a result of 6.3. She has tolerated this medication well but would like to increase the dose   Lab Results  Component Value Date   HGBA1C 6.3 (H) 10/16/2021   Wt Readings from Last 10 Encounters:  01/27/22 229 lb (103.9 kg)  01/13/22 232 lb 8 oz (105.5 kg)  12/16/21 230 lb 9.6 oz (104.6 kg)  12/08/21 229 lb (103.9 kg)  11/21/21 229 lb (103.9 kg)  10/16/21 227 lb (103 kg)  10/06/21 230 lb 1.6 oz (104.4 kg)  09/25/21 233 lb (105.7 kg)  09/18/21 233 lb (105.7 kg)  09/15/21 234 lb (106.1 kg)     HTN - managed with Hyzaar 50-12.5 mg daily. She denies dizziness,lightheadedness, blurred vision, or syncopal episodes.  She is happy to report that she has finished treatment for endometrial cancer.    Review of Systems See HPI   Past Medical History:  Diagnosis Date   Arthritis    BMI 39.0-39.9,adult    Diabetes ([place])    on Trulicity   Family history of pancreatic cancer    Family history of peritoneal cancer    Hypertension    PONV (postoperative nausea and vomiting)    Sleep apnea    C PAP    Social History   Socioeconomic History   Marital status: Married    Spouse name: Not on file   Number of children: Not on file   Years of education: Not on file   Highest education level: Not on file  Occupational History   Occupation: retired Product manager: RETIRED  Tobacco Use    Smoking status: Never   Smokeless tobacco: Never  Vaping Use   Vaping Use: Never used  Substance and Sexual Activity   Alcohol use: Yes    Alcohol/week: 4.0 standard drinks of alcohol    Types: 4 Standard drinks or equivalent per week    Comment: occas   Drug use: No   Sexual activity: Not Currently    Birth control/protection: Post-menopausal    Comment: 1st intercourse 71 yo.---Fewer than 5 partners  Other Topics Concern   Not on file  Social History Narrative   Retired from being a Education officer, museum.    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 years    0 children    Social Determinants of Health   Financial Resource Strain: Low Risk  (01/13/2022)   Overall Financial Resource Strain (CARDIA)    Difficulty of Paying Living Expenses: Not hard at all  Food Insecurity: No Food Insecurity (01/13/2022)   Hunger Vital Sign    Worried About Running Out of Food in the Last Year: Never true    Ran Out of Food in the Last Year: Never true  Transportation Needs: No Transportation Needs (01/13/2022)   PRAPARE - Hydrologist (Medical): No    Lack of Transportation (Non-Medical): No  Physical Activity: Inactive (01/13/2022)   Exercise Vital Sign    Days of Exercise per Week: 0 days    Minutes of Exercise per Session: 0 min  Stress: No Stress Concern Present (01/13/2022)   [place]    Feeling of Stress : Not at all  Social Connections: [place] (12/18/2020)   Social Connection and Isolation Panel [NHANES]    Frequency of Communication with Friends and Family: Three times a week    Frequency of Social Gatherings with Friends and Family: Three times a week    Attends Religious Services: More than 4 times per year    Active Member of Clubs or Organizations: Yes    Attends Archivist Meetings: More than 4 times per year    Marital Status: Married  Human resources officer Violence: Not At Risk (12/18/2020)    Humiliation, Afraid, Rape, and Kick questionnaire    Fear of Current or Ex-Partner: No    Emotionally Abused: No    Physically Abused: No    Sexually Abused: No    Past Surgical History:  Procedure Laterality Date   CHOLECYSTECTOMY  2004   LYMPH NODE BIOPSY N/A 10/22/2021   Procedure: Sentinel LYMPH NODE BIOPSY;  Surgeon: [name], MD;  Location: [facility];  Service: Gynecology;  Laterality: N/A;   ROBOTIC ASSISTED TOTAL HYSTERECTOMY WITH BILATERAL SALPINGO OOPHERECTOMY Bilateral 10/22/2021   Procedure: XI ROBOTIC ASSISTED TOTAL HYSTERECTOMY WITH BILATERAL SALPINGO OOPHORECTOMY;  Surgeon: [name], MD;  Location: [facility];  Service: Gynecology;  Laterality: Bilateral;    Family History  Problem Relation Age of Onset   Cancer Mother 90       primary peritoneal cancer   Alzheimer's disease Mother    Heart disease Father    Pancreatic cancer Father 73   Hypertension Brother    Heart attack Brother 67       MI x 2   Breast cancer Paternal Aunt 16   Diabetes Paternal Grandmother    Colon cancer Neg Hx    Prostate cancer Neg Hx    Ovarian cancer Neg Hx     No Known Allergies  Current Outpatient Medications on File Prior to Visit  Medication Sig Dispense Refill   acetaminophen (TYLENOL) 500 MG tablet Take 500-1,000 mg by mouth every 6 (six) hours as needed (pain.).     aspirin EC 81 MG tablet Take 81 mg by mouth in the morning.     Cholecalciferol (VITAMIN D3 PO) Take 1 tablet by mouth 2 (two) times a week.     Continuous Blood Gluc Sensor (FREESTYLE LIBRE 3 SENSOR) MISC 1 Device by Does not apply route every 14 (fourteen) days. Place 1 sensor on the skin every 14 days. Use to check glucose continuously 2 each 6   ibuprofen (ADVIL) 600 MG tablet Take 1 tablet (600 mg total) by mouth every 6 (six) hours as needed for moderate pain. For AFTER surgery only 30 tablet 0   No current facility-administered medications on file prior to visit.    BP 110/80   Pulse 70    Temp 98.6 F (37 C) (Oral)   Ht '5\' 4"'$  (1.626 m)   Wt 229 lb (103.9 kg)   SpO2 98%   BMI 39.31 kg/m       Objective:   Physical Exam Vitals and nursing note reviewed.  Constitutional:      Appearance: Normal appearance.  Cardiovascular:     Rate and  Rhythm: Normal rate and regular rhythm.     Pulses: Normal pulses.     Heart sounds: Normal heart sounds.  Pulmonary:     Effort: Pulmonary effort is normal.     Breath sounds: Normal breath sounds.  Neurological:     Mental Status: She is alert.        Assessment & Plan:  1. Pre-diabetes - POC A1c - 5.7  - Will increase trulicity  - Follow up in 3 months for CPE  - Dulaglutide (TRULICITY) 1.5 YD/2.8VT SOPN; Inject 1.5 mg into the skin once a week.  Dispense: 6 mL; Refill: 1  2. Essential hypertension, benign  - losartan-hydrochlorothiazide (HYZAAR) 50-12.5 MG tablet; Take 1 tablet by mouth daily.  Dispense: 90 tablet; Refill: 1  [name], NP

## 2022-01-27 NOTE — Patient Instructions (Signed)
It was great seeing you today   Your A1c was 5.7   Schedule your physical in 3 months

## 2022-01-28 ENCOUNTER — Other Ambulatory Visit: Payer: Self-pay | Admitting: Adult Health

## 2022-01-28 DIAGNOSIS — Z1231 Encounter for screening mammogram for malignant neoplasm of breast: Secondary | ICD-10-CM

## 2022-02-12 ENCOUNTER — Ambulatory Visit: Payer: Medicare PPO | Admitting: Radiation Oncology

## 2022-02-17 ENCOUNTER — Other Ambulatory Visit: Payer: Self-pay | Admitting: Gynecologic Oncology

## 2022-02-17 DIAGNOSIS — C541 Malignant neoplasm of endometrium: Secondary | ICD-10-CM

## 2022-02-18 ENCOUNTER — Inpatient Hospital Stay: Payer: Medicare PPO | Attending: Gynecologic Oncology

## 2022-02-18 ENCOUNTER — Other Ambulatory Visit: Payer: Self-pay

## 2022-02-18 ENCOUNTER — Encounter: Payer: Self-pay | Admitting: Radiation Oncology

## 2022-02-18 DIAGNOSIS — C541 Malignant neoplasm of endometrium: Secondary | ICD-10-CM | POA: Diagnosis not present

## 2022-02-18 LAB — BASIC METABOLIC PANEL
Anion gap: 5 (ref 5–15)
BUN: 14 mg/dL (ref 8–23)
CO2: 31 mmol/L (ref 22–32)
Calcium: 9.3 mg/dL (ref 8.9–10.3)
Chloride: 102 mmol/L (ref 98–111)
Creatinine, Ser: 0.77 mg/dL (ref 0.44–1.00)
GFR, Estimated: >60 mL/min (ref >60)
Glucose, Bld: 105 mg/dL — ABNORMAL HIGH (ref 70–99)
Potassium: 4.3 mmol/L (ref 3.5–5.1)
Sodium: 138 mmol/L (ref 135–145)

## 2022-02-18 NOTE — Progress Notes (Incomplete)
  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  Plan: The patient will follow-up with radiation oncology in one month .  ________________________________________________ -----------------------------------  Blair Promise, PhD, MD  This document serves as a record of services personally performed by Gery Pray, MD. It was created on his behalf by Roney Mans, a trained medical scribe. The creation of this record is based on the scribe's personal observations and the provider's statements to them. This document has been checked and approved by the attending provider.

## 2022-02-18 NOTE — Progress Notes (Signed)
Radiation Oncology         [phone] ________________________________  Name: Raksha Wolfgang MRN: [id number]  Date: 02/19/2022  DOB: 06/29/1950  Follow-Up Visit Note  CC: [name], NP  [name], MD  No diagnosis found.  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 She tolerated brachytherapy well overall without any significant side effects.   In the interval since her initial consultation this past July, the patient underwent genetic testing on 12/10/21. Results showed no clinically significant variants detected. 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   Otherwise, no significant interval history since the patient completed HDR treatment.   ***                 Allergies:  has No Known Allergies.  Meds: Current Outpatient Medications  Medication Sig Dispense Refill   acetaminophen (TYLENOL) 500 MG tablet Take 500-1,000 mg by mouth every 6 (six) hours as needed (pain.).     aspirin EC 81 MG tablet Take 81 mg by mouth in the morning.     Cholecalciferol (VITAMIN D3 PO) Take 1 tablet by mouth 2 (two) times a week.     Continuous Blood Gluc Sensor (FREESTYLE LIBRE 3 SENSOR) MISC 1 Device by Does not apply route every 14 (fourteen) days. Place 1 sensor on the skin every 14 days. Use to check glucose continuously 2 each 6   Dulaglutide (TRULICITY) 1.5 WU/9.8JX SOPN Inject 1.5 mg into the skin once a week. 6 mL 1   ibuprofen (ADVIL) 600 MG tablet Take 1 tablet (600 mg total) by mouth every 6 (six) hours as needed for  moderate pain. For AFTER surgery only 30 tablet 0   losartan-hydrochlorothiazide (HYZAAR) 50-12.5 MG tablet Take 1 tablet by mouth daily. 90 tablet 1   No current facility-administered medications for this encounter.    Physical Findings: The patient is in no acute distress. Patient is alert and oriented.  vitals were not taken for this visit. .  No significant changes. Lungs are clear to auscultation bilaterally. Heart has regular rate and rhythm. No palpable cervical, supraclavicular, or axillary adenopathy. Abdomen soft, non-tender, normal bowel sounds.   Lab Findings: Lab Results  Component Value Date   WBC 7.4 10/16/2021   HGB 14.3 10/16/2021   HCT 43.9 10/16/2021   MCV 89.4 10/16/2021   PLT 317 10/16/2021    Radiographic Findings: No results found.  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  ***  Plan:  ***   *** minutes of total time was spent for this patient encounter, including preparation, face-to-face counseling with the patient and coordination of care, physical exam, and documentation of the encounter. ____________________________________  Blair Promise, PhD, MD  This document serves as a record of services personally performed by Gery Pray, MD. It was created on his behalf by Roney Mans, a trained medical scribe. The creation of this record is based on the  scribe's personal observations and the provider's statements to them. This document has been checked and approved by the attending provider.

## 2022-02-19 ENCOUNTER — Telehealth: Payer: Self-pay | Admitting: *Deleted

## 2022-02-19 ENCOUNTER — Ambulatory Visit
Admission: RE | Admit: 2022-02-19 | Discharge: 2022-02-19 | Disposition: A | Discharge status: Home / Self Care | Payer: Medicare PPO | Source: Ambulatory Visit | Attending: Radiation Oncology | Admitting: Radiation Oncology

## 2022-02-19 ENCOUNTER — Encounter: Payer: Self-pay | Admitting: Radiation Oncology

## 2022-02-19 VITALS — BP 128/68 | HR 75 | Temp 97.8°F | Resp 20 | Ht 64.0 in | Wt 233.2 lb

## 2022-02-19 DIAGNOSIS — C541 Malignant neoplasm of endometrium: Secondary | ICD-10-CM | POA: Diagnosis not present

## 2022-02-19 HISTORY — DX: Personal history of irradiation: Z92.3

## 2022-02-19 NOTE — Telephone Encounter (Signed)
Per Santiago Glad in radiation patient to be scheduled to see Dr Berline Lopes in December. Appt scheduled and patient notified

## 2022-02-19 NOTE — Progress Notes (Signed)
Kylie Davis is here today for follow up post radiation to the pelvic.  They completed their radiation on: 01/14/22   Does the patient complain of any of the following:  Pain: No Abdominal bloating: No Diarrhea/Constipation: No, Patient reports changes in stools requiring her to use wipes instead of toilet paper to ensure that she is clean. Denies history of hemorrhoids.  Nausea/Vomiting: No Vaginal Discharge: No Blood in Urine or Stool: No Urinary Issues (dysuria/incomplete emptying/ incontinence/ increased frequency/urgency): Patient reports having stress incontinence and urgency at night.   Does patient report using vaginal dilator 2-3 times a week and/or sexually active 2-3 weeks: Patient given S and S+ vaginal dilators with instructions and importance of use. Patient voiced understanding.  Post radiation skin changes: No   Additional comments if applicable:    BP 300/76 (BP Location: Right Arm, Patient Position: Sitting, Cuff Size: Large)   Pulse 75   Temp 97.8 F (36.6 C)   Resp 20   Ht '5\' 4"'$  (1.626 m)   Wt 233 lb 3.2 oz (105.8 kg)   SpO2 98%   BMI 40.03 kg/m

## 2022-02-25 ENCOUNTER — Ambulatory Visit (HOSPITAL_COMMUNITY)
Admission: RE | Admit: 2022-02-25 | Discharge: 2022-02-25 | Disposition: A | Discharge status: Home / Self Care | Payer: Medicare PPO | Source: Ambulatory Visit | Attending: Gynecologic Oncology | Admitting: Gynecologic Oncology

## 2022-02-25 DIAGNOSIS — C541 Malignant neoplasm of endometrium: Secondary | ICD-10-CM | POA: Diagnosis not present

## 2022-02-25 DIAGNOSIS — C539 Malignant neoplasm of cervix uteri, unspecified: Secondary | ICD-10-CM | POA: Diagnosis not present

## 2022-02-25 DIAGNOSIS — K5730 Diverticulosis of large intestine without perforation or abscess without bleeding: Secondary | ICD-10-CM | POA: Diagnosis not present

## 2022-02-25 DIAGNOSIS — C55 Malignant neoplasm of uterus, part unspecified: Secondary | ICD-10-CM | POA: Diagnosis not present

## 2022-02-25 DIAGNOSIS — N3289 Other specified disorders of bladder: Secondary | ICD-10-CM | POA: Diagnosis not present

## 2022-02-25 MED ORDER — SODIUM CHLORIDE (PF) 0.9 % IJ SOLN
INTRAMUSCULAR | Status: AC
  Filled 2022-02-25: qty 50

## 2022-02-25 MED ORDER — IOHEXOL 300 MG/ML  SOLN
100.0000 mL | Freq: Once | INTRAMUSCULAR | Status: AC | PRN
Start: 2022-02-25 — End: 2022-02-25
  Administered 2022-02-25: 100 mL via INTRAVENOUS

## 2022-03-03 ENCOUNTER — Telehealth: Payer: Self-pay | Admitting: Gynecologic Oncology

## 2022-03-03 NOTE — Telephone Encounter (Signed)
Called to discuss CT findings from last week.  All questions answered.  We will plan for 1 additional repeat CT scan in approximately 6 months to assure stability of small upper abdominal nodules.  Jeral Pinch MD Gynecologic Oncology

## 2022-03-11 ENCOUNTER — Ambulatory Visit: Payer: Medicare PPO | Admitting: Adult Health

## 2022-03-11 DIAGNOSIS — G4733 Obstructive sleep apnea (adult) (pediatric): Secondary | ICD-10-CM | POA: Diagnosis not present

## 2022-03-11 DIAGNOSIS — Z6841 Body Mass Index (BMI) 40.0 and over, adult: Secondary | ICD-10-CM | POA: Diagnosis not present

## 2022-03-11 NOTE — Assessment & Plan Note (Signed)
Excellent control compliance on CPAP.  Continue with the good work  Plan  Patient Instructions  Continue on CPAP  Keep up the good work  Work on [employer] Do not drive if sleepy  Follow-up in 1 year with Dr. Elsworth Soho  Or Glen Kesinger NP and As needed.

## 2022-03-11 NOTE — Progress Notes (Signed)
$'@Patient'O$  ID: Kylie Davis, female    DOB: [birth year], 71 y.o.   MRN: [id number]  Chief Complaint  Patient presents with   Follow-up    Referring provider: [name], NP  HPI: 71 year old female for smoker followed for obstructive sleep apnea Retired Radio producer  TEST/EVENTS :  HST 10/2013:  AHI 25/hr.  03/11/2022 Follow up : OSA patient returns for 1 year follow up.  Patient has sleep apnea is on nocturnal CPAP.  Patient says she is doing well on CPAP.  She wears her CPAP every single night.  Feels that she benefits with CPAP with decreased daytime sleepiness.  CPAP download shows excellent compliance with daily average usage at 7 hours.  Patient is on auto CPAP 5 to 20 cm H2O.  Daily average pressure at 12.3 cm H2O.  AHI is 2.7/hour.  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  She underwent total hysterectomy and radiation therapy.  Surveillance CT chest February 25, 2022 showed no evidence of recurrent disease.  There are following small nodules in the anterior upper abdomen that appear stable.  She has a upcoming surveillance CT in 6 months.  Says she is doing well.  She is trying to become more active.  Continues to travel to the [place] and [place] often.  Usually spends the winters in [place].  No Known Allergies  Immunization History  Administered Date(s) Administered   Influenza Split 02/25/2012, 03/18/2013, 02/02/2014   Influenza, High Dose Seasonal PF 02/28/2015, 02/20/2016, 03/03/2017, 03/08/2018, 03/17/2019   Moderna SARS-COV2 Booster Vaccination 05/10/2020   Moderna Sars-Covid-2 Vaccination 07/22/2019, 08/22/2019   Pneumococcal Conjugate-13 02/28/2015   Pneumococcal Polysaccharide-23 02/20/2016   Td 05/19/2003   Tdap 09/13/2013   Zoster Recombinat (Shingrix) 06/25/2018, 11/06/2018   Zoster, Live 12/15/2010    Past Medical History:  Diagnosis Date   Arthritis    BMI 39.0-39.9,adult    Diabetes ([place])     on Trulicity   Family history of pancreatic cancer    Family history of peritoneal cancer    History of radiation therapy    Endometrium- HDR 12/16/21-01/15/20- Dr. Gery Pray   Hypertension    PONV (postoperative nausea and vomiting)    Sleep apnea    C PAP    Tobacco History: Social History   Tobacco Use  Smoking Status Never  Smokeless Tobacco Never   Counseling given: Not Answered   Outpatient Medications Prior to Visit  Medication Sig Dispense Refill   acetaminophen (TYLENOL) 500 MG tablet Take 500-1,000 mg by mouth every 6 (six) hours as needed (pain.).     aspirin EC 81 MG tablet Take 81 mg by mouth in the morning.     Cholecalciferol (VITAMIN D3 PO) Take 1 tablet by mouth 2 (two) times a week.     Continuous Blood Gluc Sensor (FREESTYLE LIBRE 3 SENSOR) MISC 1 Device by Does not apply route every 14 (fourteen) days. Place 1 sensor on the skin every 14 days. Use to check glucose continuously 2 each 6   Dulaglutide (TRULICITY) 1.5 TG/6.2IR SOPN Inject 1.5 mg into the skin once a week. 6 mL 1   ibuprofen (ADVIL) 600 MG tablet Take 1 tablet (600 mg total) by mouth every 6 (six) hours as needed for moderate pain. For AFTER surgery only 30 tablet 0   losartan-hydrochlorothiazide (HYZAAR) 50-12.5 MG tablet Take 1 tablet by mouth daily. 90 tablet 1   No facility-administered medications prior to visit.     Review of  Systems:   Constitutional:   No  weight loss, night sweats,  Fevers, chills,  +fatigue, or  lassitude.  HEENT:   No headaches,  Difficulty swallowing,  Tooth/dental problems, or  Sore throat,                No sneezing, itching, ear ache, nasal congestion, post nasal drip,   CV:  No chest pain,  Orthopnea, PND, swelling in lower extremities, anasarca, dizziness, palpitations, syncope.   GI  No heartburn, indigestion, abdominal pain, nausea, vomiting, diarrhea, change in bowel habits, loss of appetite, bloody stools.   Resp: No shortness of breath with  exertion or at rest.  No excess mucus, no productive cough,  No non-productive cough,  No coughing up of blood.  No change in color of mucus.  No wheezing.  No chest wall deformity  Skin: no rash or lesions.  GU: no dysuria, change in color of urine, no urgency or frequency.  No flank pain, no hematuria   MS:  No joint pain or swelling.  No decreased range of motion.  No back pain.    Physical Exam  BP 110/70 (BP Location: Left Arm, Patient Position: Sitting, Cuff Size: Large)   Pulse 65   Temp 98.2 F (36.8 C)   Ht '5\' 4"'$  (1.626 m)   Wt 237 lb 12.8 oz (107.9 kg)   SpO2 98%   BMI 40.82 kg/m   GEN: A/Ox3; pleasant , NAD, well nourished    HEENT:  [place]/AT,   NOSE-clear, THROAT-clear, no lesions, no postnasal drip or exudate noted.   NECK:  Supple w/ fair ROM; no JVD; normal carotid impulses w/o bruits; no thyromegaly or nodules palpated; no lymphadenopathy.    RESP  Clear  P & A; w/o, wheezes/ rales/ or rhonchi. no accessory muscle use, no dullness to percussion  CARD:  RRR, no m/r/g, no peripheral edema, pulses intact, no cyanosis or clubbing.  GI:   Soft & nt; nml bowel sounds; no organomegaly or masses detected.   Musco: Warm bil, no deformities or joint swelling noted.   Neuro: alert, no focal deficits noted.    Skin: Warm, no lesions or rashes      BMET   BNP No results found for: "BNP"  ProBNP No results found for: "PROBNP"  Imaging: CT Abdomen Pelvis W Contrast  Result Date: 02/26/2022 CLINICAL DATA:  Uterine/cervical cancer, monitoring. Follow-up endometrial cancer. Hysterectomy, post XRT. EXAM: CT ABDOMEN AND PELVIS WITH CONTRAST TECHNIQUE: Multidetector CT imaging of the abdomen and pelvis was performed using the standard protocol following bolus administration of intravenous contrast. RADIATION DOSE REDUCTION: This exam was performed according to the departmental dose-optimization program which includes automated exposure control, adjustment of the mA  and/or kV according to patient size and/or use of iterative reconstruction technique. CONTRAST:  133m OMNIPAQUE IOHEXOL 300 MG/ML  SOLN COMPARISON:  Abdominopelvic CT 11/21/2021 FINDINGS: Lower chest: No basilar pulmonary nodule, acute airspace disease or pleural effusion. Subsegmental atelectasis in the dependent lower lobes. Hepatobiliary: No focal hepatic lesion or focal hepatic abnormality. Post cholecystectomy. No intrahepatic biliary ductal dilatation. The common bile duct measures 10 mm, normal for prior cholecystectomy. Pancreas: No pancreatic mass.  No ductal dilatation or inflammation. Spleen: Normal in size without focal abnormality. Adrenals/Urinary Tract: No adrenal nodule. No hydronephrosis, renal calculi or suspicious renal lesion. Homogeneous enhancement with symmetric excretion on delayed phase imaging. The urinary bladder is partially distended. No bladder wall thickening. Stomach/Bowel: Normal appearance of the stomach. Normal positioning of the  duodenum and ligament of Treitz. Normal small bowel without obstruction, wall thickening or inflammatory change. Air-filled appendix without appendicitis. Moderate colonic stool burden. Sigmoid colonic diverticulosis. No diverticulitis. No colonic wall thickening or inflammation. Vascular/Lymphatic: Minimal aortic atherosclerosis, no aneurysm. Patent portal, splenic and mesenteric veins. Nodules in the anterior upper abdomen are stable from prior exam. Largest measures 4 mm series 2, image 17, anterior to the left hepatic lobe. Additional tiny densities are seen anterior to the liver series 2, image 19, image 23, and image 24, all of which are stable from prior exam. No new nodularity. No discretely enlarged abdominopelvic lymph nodes. No pelvic or inguinal adenopathy. Reproductive: The uterus is surgically absent. Normal appearance of the vaginal cuff. No adnexal mass or fluid collection. Other: Stable small nodules in the anterior upper abdomen. No new  nodules or omental thickening. No ascites. Tiny fat containing umbilical hernia. Tiny fat containing supraumbilical ventral abdominal wall hernia. Musculoskeletal: No lytic or blastic osseous lesions. L5-S1 degenerative disc disease with additional multilevel facet hypertrophy in the lumbar spine. Bilateral hip osteoarthritis. No subcutaneous lesions. IMPRESSION: 1. Post hysterectomy without evidence of recurrent disease. 2. Small nodules in the anterior upper abdomen are stable from prior exam, all less than 5 mm short axis. Recommend continued attention at follow-up. There is otherwise no evidence of metastatic disease. 3. Colonic diverticulosis without diverticulitis. Aortic Atherosclerosis (ICD10-I70.0). Electronically Signed   By: Keith Rake M.D.   On: 02/26/2022 12:19          No data to display          No results found for: "NITRICOXIDE"      Assessment & Plan:   OSA (obstructive sleep apnea) Excellent control compliance on CPAP.  Continue with the good work  Plan  Patient Instructions  Continue on CPAP  Keep up the good work  Work on [employer] Do not drive if sleepy  Follow-up in 1 year with Dr. Elsworth Soho  Or Dayra Rapley NP and As needed.      Body mass index (BMI) 40.0-44.9, adult ([place]) Healthy weight loss discussed     Rexene Edison, NP 03/11/2022

## 2022-03-11 NOTE — Patient Instructions (Signed)
Continue on CPAP  Keep up the good work  Work on [employer] Do not drive if sleepy  Follow-up in 1 year with Dr. Elsworth Soho  Or Jaivyn Gulla NP and As needed.

## 2022-03-11 NOTE — Assessment & Plan Note (Signed)
Healthy weight loss discussed 

## 2022-03-11 NOTE — Addendum Note (Signed)
Addended by: Vanessa Barbara on: 03/11/2022 05:41 PM   Modules accepted: Orders

## 2022-03-30 DIAGNOSIS — H1131 Conjunctival hemorrhage, right eye: Secondary | ICD-10-CM | POA: Diagnosis not present

## 2022-03-31 DIAGNOSIS — D2262 Melanocytic nevi of left upper limb, including shoulder: Secondary | ICD-10-CM | POA: Diagnosis not present

## 2022-03-31 DIAGNOSIS — L570 Actinic keratosis: Secondary | ICD-10-CM | POA: Diagnosis not present

## 2022-03-31 DIAGNOSIS — D225 Melanocytic nevi of trunk: Secondary | ICD-10-CM | POA: Diagnosis not present

## 2022-03-31 DIAGNOSIS — Z872 Personal history of diseases of the skin and subcutaneous tissue: Secondary | ICD-10-CM | POA: Diagnosis not present

## 2022-03-31 DIAGNOSIS — Z09 Encounter for follow-up examination after completed treatment for conditions other than malignant neoplasm: Secondary | ICD-10-CM | POA: Diagnosis not present

## 2022-03-31 DIAGNOSIS — L814 Other melanin hyperpigmentation: Secondary | ICD-10-CM | POA: Diagnosis not present

## 2022-03-31 DIAGNOSIS — L821 Other seborrheic keratosis: Secondary | ICD-10-CM | POA: Diagnosis not present

## 2022-04-01 ENCOUNTER — Encounter: Payer: Self-pay | Admitting: Adult Health

## 2022-04-01 ENCOUNTER — Ambulatory Visit (INDEPENDENT_AMBULATORY_CARE_PROVIDER_SITE_OTHER): Payer: Medicare PPO | Admitting: Adult Health

## 2022-04-01 VITALS — BP 120/70 | HR 65 | Temp 98.2°F | Ht 63.5 in | Wt 236.0 lb

## 2022-04-01 DIAGNOSIS — C541 Malignant neoplasm of endometrium: Secondary | ICD-10-CM | POA: Diagnosis not present

## 2022-04-01 DIAGNOSIS — G4733 Obstructive sleep apnea (adult) (pediatric): Secondary | ICD-10-CM

## 2022-04-01 DIAGNOSIS — E669 Obesity, unspecified: Secondary | ICD-10-CM | POA: Diagnosis not present

## 2022-04-01 DIAGNOSIS — R7303 Prediabetes: Secondary | ICD-10-CM | POA: Diagnosis not present

## 2022-04-01 DIAGNOSIS — Z0000 Encounter for general adult medical examination without abnormal findings: Secondary | ICD-10-CM

## 2022-04-01 DIAGNOSIS — I10 Essential (primary) hypertension: Secondary | ICD-10-CM | POA: Diagnosis not present

## 2022-04-01 LAB — CBC WITH DIFFERENTIAL/PLATELET
Basophils Absolute: 0.1 10*3/uL (ref 0.0–0.1)
Basophils Relative: 1 % (ref 0.0–3.0)
Eosinophils Absolute: 0.4 10*3/uL (ref 0.0–0.7)
Eosinophils Relative: 5.9 % — ABNORMAL HIGH (ref 0.0–5.0)
HCT: 42.1 % (ref 36.0–46.0)
Hemoglobin: 14 g/dL (ref 12.0–15.0)
Lymphocytes Relative: 33.8 % (ref 12.0–46.0)
Lymphs Abs: 2.1 10*3/uL (ref 0.7–4.0)
MCHC: 33.2 g/dL (ref 30.0–36.0)
MCV: 88.5 fl (ref 78.0–100.0)
Monocytes Absolute: 0.7 10*3/uL (ref 0.1–1.0)
Monocytes Relative: 10.8 % (ref 3.0–12.0)
Neutro Abs: 2.9 10*3/uL (ref 1.4–7.7)
Neutrophils Relative %: 48.5 % (ref 43.0–77.0)
Platelets: 280 10*3/uL (ref 150.0–400.0)
RBC: 4.75 Mil/uL (ref 3.87–5.11)
RDW: 15 % (ref 11.5–15.5)
WBC: 6.1 10*3/uL (ref 4.0–10.5)

## 2022-04-01 LAB — COMPREHENSIVE METABOLIC PANEL
ALT: 30 U/L (ref 0–35)
AST: 21 U/L (ref 0–37)
Albumin: 4.2 g/dL (ref 3.5–5.2)
Alkaline Phosphatase: 68 U/L (ref 39–117)
BUN: 15 mg/dL (ref 6–23)
CO2: 30 mEq/L (ref 19–32)
Calcium: 9.4 mg/dL (ref 8.4–10.5)
Chloride: 100 mEq/L (ref 96–112)
Creatinine, Ser: 0.75 mg/dL (ref 0.40–1.20)
GFR: 79.48 mL/min (ref >60.00)
Glucose, Bld: 104 mg/dL — ABNORMAL HIGH (ref 70–99)
Potassium: 4.1 mEq/L (ref 3.5–5.1)
Sodium: 136 mEq/L (ref 135–145)
Total Bilirubin: 0.3 mg/dL (ref 0.2–1.2)
Total Protein: 7.6 g/dL (ref 6.0–8.3)

## 2022-04-01 LAB — LIPID PANEL
Cholesterol: 176 mg/dL (ref 0–200)
HDL: 52.8 mg/dL (ref >39.00)
LDL Cholesterol: 107 mg/dL — ABNORMAL HIGH (ref 0–99)
NonHDL: 123.34
Total CHOL/HDL Ratio: 3
Triglycerides: 83 mg/dL (ref 0.0–149.0)
VLDL: 16.6 mg/dL (ref 0.0–40.0)

## 2022-04-01 LAB — MICROALBUMIN / CREATININE URINE RATIO
Creatinine,U: 58.2 mg/dL
Microalb Creat Ratio: 1.2 mg/g (ref 0.0–30.0)
Microalb, Ur: <0.7 mg/dL (ref 0.0–1.9)

## 2022-04-01 LAB — TSH: TSH: 3.47 u[IU]/mL (ref 0.35–5.50)

## 2022-04-01 LAB — HEMOGLOBIN A1C: Hgb A1c MFr Bld: 6.4 % (ref 4.6–6.5)

## 2022-04-01 MED ORDER — WEGOVY 0.25 MG/0.5ML ~~LOC~~ SOAJ: 0.2500 mg | SUBCUTANEOUS | 0 refills | Status: DC

## 2022-04-01 NOTE — Progress Notes (Signed)
Subjective:    Patient ID: Kylie Davis, female    DOB: [birth year], 71 y.o.   MRN: [id number]  HPI Patient presents for yearly preventative medicine examination. He is a pleasant 71  year old female who  has a past medical history of Arthritis, BMI 39.0-39.9,adult, Diabetes ([place]), Family history of pancreatic cancer, Family history of peritoneal cancer, History of radiation therapy, Hypertension, PONV (postoperative nausea and vomiting), and Sleep apnea.  Pre diabetes -currently maintained on Trulicity 1.5 mg weekly.  She has been tolerating this dose well.  She denies any side effects but does not feel as though this curbs her appetite any longer.  Lab Results  Component Value Date   HGBA1C 6.3 (H) 10/16/2021   HTN -managed with Hyzaar 50-12.5 mg daily.  She denies dizziness, lightheadedness, blurred vision, or syncopal episodes BP Readings from Last 3 Encounters:  04/01/22 120/70  03/11/22 110/70  02/19/22 128/68   OSA -followed by pulmonary.  He does wear her mask every night For around 6 to 7 hours.  Denies feeling fatigued or any significant daytime somnolence.  History of Endometrial Cancer -diagnosed in 2023.  Has currently completed HDR treatment had robotic assisted total hysterectomy in June 2023.  All immunizations and health maintenance protocols were reviewed with the patient and needed orders were placed.  Appropriate screening laboratory values were ordered for the patient including screening of hyperlipidemia, renal function and hepatic function.   Medication reconciliation,  past medical history, social history, problem list and allergies were reviewed in detail with the patient  Goals were established with regard to weight loss, exercise, and  diet in compliance with . She is staying active and trying to eat healthy but is not able to lose weight   Wt Readings from Last 3 Encounters:  04/01/22 236 lb (107 kg)  03/11/22 237 lb 12.8 oz (107.9 kg)  02/19/22  233 lb 3.2 oz (105.8 kg)   Review of Systems  Constitutional: Negative.   HENT: Negative.    Eyes: Negative.   Respiratory: Negative.    Cardiovascular: Negative.   Gastrointestinal: Negative.   Endocrine: Negative.   Genitourinary: Negative.   Musculoskeletal: Negative.   Skin: Negative.   Allergic/Immunologic: Negative.   Neurological: Negative.   Hematological: Negative.   Psychiatric/Behavioral: Negative.     Past Medical History:  Diagnosis Date   Arthritis    BMI 39.0-39.9,adult    Diabetes ([place])    on Trulicity   Family history of pancreatic cancer    Family history of peritoneal cancer    History of radiation therapy    Endometrium- HDR 12/16/21-01/15/20- Dr. Gery Pray   Hypertension    PONV (postoperative nausea and vomiting)    Sleep apnea    C PAP    Social History   Socioeconomic History   Marital status: Married    Spouse name: Not on file   Number of children: Not on file   Years of education: Not on file   Highest education level: Not on file  Occupational History   Occupation: retired Product manager: RETIRED  Tobacco Use   Smoking status: Never   Smokeless tobacco: Never  Vaping Use   Vaping Use: Never used  Substance and Sexual Activity   Alcohol use: Yes    Alcohol/week: 4.0 standard drinks of alcohol    Types: 4 Standard drinks or equivalent per week    Comment: occas   Drug use: No   Sexual activity: Not Currently  Birth control/protection: Post-menopausal    Comment: 1st intercourse 71 yo.---Fewer than 5 partners  Other Topics Concern   Not on file  Social History Narrative   Retired from being a Education officer, museum.    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 years    0 children    Social Determinants of Health   Financial Resource Strain: Low Risk  (01/13/2022)   Overall Financial Resource Strain (CARDIA)    Difficulty of Paying Living Expenses: Not hard at all  Food Insecurity: No Food Insecurity (01/13/2022)   Hunger Vital Sign    Worried  About Running Out of Food in the Last Year: Never true    Ran Out of Food in the Last Year: Never true  Transportation Needs: No Transportation Needs (01/13/2022)   PRAPARE - Hydrologist (Medical): No    Lack of Transportation (Non-Medical): No  Physical Activity: Inactive (01/13/2022)   Exercise Vital Sign    Days of Exercise per Week: 0 days    Minutes of Exercise per Session: 0 min  Stress: No Stress Concern Present (01/13/2022)   [place]    Feeling of Stress : Not at all  Social Connections: [place] (12/18/2020)   Social Connection and Isolation Panel [NHANES]    Frequency of Communication with Friends and Family: Three times a week    Frequency of Social Gatherings with Friends and Family: Three times a week    Attends Religious Services: More than 4 times per year    Active Member of Clubs or Organizations: Yes    Attends Archivist Meetings: More than 4 times per year    Marital Status: Married  Human resources officer Violence: Not At Risk (12/18/2020)   Humiliation, Afraid, Rape, and Kick questionnaire    Fear of Current or Ex-Partner: No    Emotionally Abused: No    Physically Abused: No    Sexually Abused: No    Past Surgical History:  Procedure Laterality Date   CHOLECYSTECTOMY  2004   LYMPH NODE BIOPSY N/A 10/22/2021   Procedure: Sentinel LYMPH NODE BIOPSY;  Surgeon: [name], MD;  Location: [facility];  Service: Gynecology;  Laterality: N/A;   ROBOTIC ASSISTED TOTAL HYSTERECTOMY WITH BILATERAL SALPINGO OOPHERECTOMY Bilateral 10/22/2021   Procedure: XI ROBOTIC ASSISTED TOTAL HYSTERECTOMY WITH BILATERAL SALPINGO OOPHORECTOMY;  Surgeon: [name], MD;  Location: [facility];  Service: Gynecology;  Laterality: Bilateral;    Family History  Problem Relation Age of Onset   Cancer Mother 56       primary peritoneal cancer   Alzheimer's disease Mother     Heart disease Father    Pancreatic cancer Father 28   Hypertension Brother    Heart attack Brother 74       MI x 2   Breast cancer Paternal Aunt 43   Diabetes Paternal Grandmother    Colon cancer Neg Hx    Prostate cancer Neg Hx    Ovarian cancer Neg Hx     No Known Allergies  Current Outpatient Medications on File Prior to Visit  Medication Sig Dispense Refill   acetaminophen (TYLENOL) 500 MG tablet Take 500-1,000 mg by mouth every 6 (six) hours as needed (pain.).     aspirin EC 81 MG tablet Take 81 mg by mouth in the morning.     Cholecalciferol (VITAMIN D3 PO) Take 1 tablet by mouth 2 (two) times a week.     Continuous Blood  Gluc Sensor (FREESTYLE LIBRE 3 SENSOR) MISC 1 Device by Does not apply route every 14 (fourteen) days. Place 1 sensor on the skin every 14 days. Use to check glucose continuously 2 each 6   Dulaglutide (TRULICITY) 1.5 KK/9.3GH SOPN Inject 1.5 mg into the skin once a week. 6 mL 1   ibuprofen (ADVIL) 600 MG tablet Take 1 tablet (600 mg total) by mouth every 6 (six) hours as needed for moderate pain. For AFTER surgery only 30 tablet 0   losartan-hydrochlorothiazide (HYZAAR) 50-12.5 MG tablet Take 1 tablet by mouth daily. 90 tablet 1   No current facility-administered medications on file prior to visit.    BP 120/70   Pulse 65   Temp 98.2 F (36.8 C) (Oral)   Ht 5' 3.5" (1.613 m)   Wt 236 lb (107 kg)   SpO2 98%   BMI 41.15 kg/m       Objective:   Physical Exam Vitals and nursing note reviewed.  Constitutional:      General: She is not in acute distress.    Appearance: Normal appearance. She is well-developed. She is obese. She is not ill-appearing.  HENT:     Head: Normocephalic and atraumatic.     Right Ear: Tympanic membrane, ear canal and external ear normal. There is no impacted cerumen.     Left Ear: Tympanic membrane, ear canal and external ear normal. There is no impacted cerumen.     Nose: Nose normal. No congestion or rhinorrhea.      Mouth/Throat:     Mouth: Mucous membranes are moist.     Pharynx: Oropharynx is clear. No oropharyngeal exudate or posterior oropharyngeal erythema.  Eyes:     General:        Right eye: No discharge.        Left eye: No discharge.     Extraocular Movements: Extraocular movements intact.     Conjunctiva/sclera: Conjunctivae normal.     Pupils: Pupils are equal, round, and reactive to light.  Neck:     Thyroid: No thyromegaly.     Vascular: No carotid bruit.     Trachea: No tracheal deviation.  Cardiovascular:     Rate and Rhythm: Normal rate and regular rhythm.     Pulses: Normal pulses.     Heart sounds: Normal heart sounds. No murmur heard.    No friction rub. No gallop.  Pulmonary:     Effort: Pulmonary effort is normal. No respiratory distress.     Breath sounds: Normal breath sounds. No stridor. No wheezing, rhonchi or rales.  Chest:     Chest wall: No tenderness.  Abdominal:     General: Abdomen is flat. Bowel sounds are normal. There is no distension.     Palpations: Abdomen is soft. There is no mass.     Tenderness: There is no abdominal tenderness. There is no right CVA tenderness, left CVA tenderness, guarding or rebound.     Hernia: No hernia is present.  Musculoskeletal:        General: No swelling, tenderness, deformity or signs of injury. Normal range of motion.     Cervical back: Normal range of motion and neck supple.     Right lower leg: No edema.     Left lower leg: No edema.  Lymphadenopathy:     Cervical: No cervical adenopathy.  Skin:    General: Skin is warm and dry.     Coloration: Skin is not jaundiced or pale.  Findings: No bruising, erythema, lesion or rash.  Neurological:     General: No focal deficit present.     Mental Status: She is alert and oriented to person, place, and time.     Cranial Nerves: No cranial nerve deficit.     Sensory: No sensory deficit.     Motor: No weakness.     Coordination: Coordination normal.     Gait: Gait  normal.     Deep Tendon Reflexes: Reflexes normal.  Psychiatric:        Mood and Affect: Mood normal.        Behavior: Behavior normal.        Thought Content: Thought content normal.        Judgment: Judgment normal.       Assessment & Plan:  1. Routine general medical examination at a health care facility - Follow up in one year or sooner if needed - CBC with Differential/Platelet; Future - Comprehensive metabolic panel; Future - Hemoglobin A1c; Future - Lipid panel; Future - TSH; Future - TSH - Lipid panel - Hemoglobin A1c - Comprehensive metabolic panel - CBC with Differential/Platelet  2. Pre-diabetes - Will switch to wegovy. Advised stopping Trulicity once she is able to start [facility]  - Continue to stay active and eat healthy  - CBC with Differential/Platelet; Future - Comprehensive metabolic panel; Future - Hemoglobin A1c; Future - Lipid panel; Future - TSH; Future - Microalbumin/Creatinine Ratio, Urine; Future - Semaglutide-Weight Management (WEGOVY) 0.25 MG/0.5ML SOAJ; Inject 0.25 mg into the skin once a week.  Dispense: 2 mL; Refill: 0 - Microalbumin/Creatinine Ratio, Urine - TSH - Lipid panel - Hemoglobin A1c - Comprehensive metabolic panel - CBC with Differential/Platelet  3. Essential hypertension, benign - Well controlled. No change in medications  - CBC with Differential/Platelet; Future - Comprehensive metabolic panel; Future - Hemoglobin A1c; Future - Lipid panel; Future - TSH; Future - Microalbumin/Creatinine Ratio, Urine; Future - Microalbumin/Creatinine Ratio, Urine - TSH - Lipid panel - Hemoglobin A1c - Comprehensive metabolic panel - CBC with Differential/Platelet  4. OSA (obstructive sleep apnea) - Follow up with pulmonary as directed - CBC with Differential/Platelet; Future - Comprehensive metabolic panel; Future - Hemoglobin A1c; Future - Lipid panel; Future - TSH; Future - TSH - Lipid panel - Hemoglobin A1c - Comprehensive  metabolic panel - CBC with Differential/Platelet  5. Obesity, Class II, BMI 35-39.9 - Will [facility] to [facility]  - CBC with Differential/Platelet; Future - Comprehensive metabolic panel; Future - Hemoglobin A1c; Future - Lipid panel; Future - TSH; Future - Semaglutide-Weight Management (WEGOVY) 0.25 MG/0.5ML SOAJ; Inject 0.25 mg into the skin once a week.  Dispense: 2 mL; Refill: 0 - TSH - Lipid panel - Hemoglobin A1c - Comprehensive metabolic panel - CBC with Differential/Platelet  6. Endometrial cancer ([place]) - Follow up with Oncology as directed - CBC with Differential/Platelet; Future - Comprehensive metabolic panel; Future - Hemoglobin A1c; Future - Lipid panel; Future - TSH; Future - TSH - Lipid panel - Hemoglobin A1c - Comprehensive metabolic panel - CBC with Differential/Platelet  [name], NP

## 2022-04-02 ENCOUNTER — Encounter: Payer: Self-pay | Admitting: Adult Health

## 2022-04-02 MED ORDER — ROSUVASTATIN CALCIUM 10 MG PO TABS: 10.0000 mg | ORAL_TABLET | Freq: Every day | ORAL | 3 refills | Status: DC

## 2022-04-02 NOTE — Addendum Note (Signed)
Addended by: Gwenyth Ober R on: 04/02/2022 04:56 PM   Modules accepted: Orders

## 2022-04-20 ENCOUNTER — Encounter: Payer: Self-pay | Admitting: Gynecologic Oncology

## 2022-04-21 ENCOUNTER — Inpatient Hospital Stay: Payer: Medicare PPO | Attending: Gynecologic Oncology | Admitting: Gynecologic Oncology

## 2022-04-21 ENCOUNTER — Encounter: Payer: Self-pay | Admitting: Gynecologic Oncology

## 2022-04-21 VITALS — BP 154/72 | HR 95 | Temp 98.8°F | Resp 18 | Wt 242.1 lb

## 2022-04-21 DIAGNOSIS — Z90722 Acquired absence of ovaries, bilateral: Secondary | ICD-10-CM | POA: Insufficient documentation

## 2022-04-21 DIAGNOSIS — C541 Malignant neoplasm of endometrium: Secondary | ICD-10-CM | POA: Diagnosis not present

## 2022-04-21 DIAGNOSIS — Z923 Personal history of irradiation: Secondary | ICD-10-CM | POA: Diagnosis not present

## 2022-04-21 DIAGNOSIS — Z90710 Acquired absence of both cervix and uterus: Secondary | ICD-10-CM | POA: Diagnosis not present

## 2022-04-21 DIAGNOSIS — Z8542 Personal history of malignant neoplasm of other parts of uterus: Secondary | ICD-10-CM | POA: Insufficient documentation

## 2022-04-21 NOTE — Progress Notes (Signed)
Gynecologic Oncology Return Clinic Visit  04/21/22  Reason for Visit: surveillance in the setting of endometrial cancer  Treatment History: Oncology History  Endometrial cancer ([place])  09/25/2021 Initial Biopsy   EMB - grade 1 endometrioid adenocarcinoma, MMR IHC intact   10/06/2021 Initial Diagnosis   Endometrial cancer ([place])   10/22/2021 Surgery   TRH/BSO, SLN bilaterally, LOA  Findings: On EUA, small mobile uterus. On intra-abdominal entry, normal upper abdominal survey. Some adhesions of the omentum to the left abdominal side wall in the upper abdomen. Some adhesions of the ascending colon to the right abdominal side wall and of the cecum to the right IP ligament. 8 cm uterus, normal in appearance. Normal appearing. Bilateral adnexa. No gross adenopathy. Mapping successful to bilateral obturator SLNs. Sigmoid colon adherent to the left IP ligament and left pelvic sidewall.   10/22/2021 Pathology Results   Stage IB, grade 1 MI 63% (difficult to determine given adenomyosis) LVI negative SLN on right negative, no definitive SLN seen in tissue excised on left (lymphoid cells negative for malignancy)   11/21/2021 Imaging   CT A/P: 1. Status post hysterectomy and bilateral salpingo-oophorectomy. 2. There are a few small nodules noted in the anterior abdomen adjacent to the left hepatic lobe, all measuring less than 5 mm. Recommend attention on future studies. A follow-up CT abdomen/pelvis in 4-6 months is recommended. 3. Status post cholecystectomy without biliary dilatation.   12/16/2021 - 01/14/2022 Radiation Therapy   12/16/2021 through 01/14/2022 Site Technique Total Dose (Gy) Dose per Fx (Gy) Completed Fx Beam Energies  Vagina: Pelvis HDR-brachy 30/30 6 5/5 Ir-192        01/01/2022 Genetic Testing   Negative genetic testing on the CancerNext-Expanded+RNAinsight panel.  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  The report date is 01/01/2022.  The CancerNext-Expanded gene panel offered  by [facility] and includes sequencing and rearrangement analysis for the following 77 genes: AIP, ALK, APC*, ATM*, AXIN2, BAP1, BARD1, BLM, BMPR1A, BRCA1*, BRCA2*, BRIP1*, CDC73, CDH1*, CDK4, CDKN1B, CDKN2A, CHEK2*, CTNNA1, DICER1, FANCC, FH, FLCN, GALNT12, KIF1B, LZTR1, MAX, MEN1, MET, MLH1*, MSH2*, MSH3, MSH6*, MUTYH*, NBN, NF1*, NF2, NTHL1, PALB2*, PHOX2B, PMS2*, POT1, PRKAR1A, PTCH1, PTEN*, RAD51C*, RAD51D*, RB1, RECQL, RET, SDHA, SDHAF2, SDHB, SDHC, SDHD, SMAD4, SMARCA4, SMARCB1, SMARCE1, STK11, SUFU, TMEM127, TP53*, TSC1, TSC2, VHL and XRCC2 (sequencing and deletion/duplication); EGFR, EGLN1, HOXB13, KIT, MITF, PDGFRA, POLD1, and POLE (sequencing only); EPCAM and GREM1 (deletion/duplication only). DNA and RNA analyses performed for * genes.      Interval History: The patient reports overall doing well.  She denies any abdominal or pelvic pain.  Is trying to use her vaginal dilator regularly.  Denies any vaginal bleeding or discharge.  Reports baseline bowel and bladder function.  Past Medical/Surgical History: Past Medical History:  Diagnosis Date   Arthritis    BMI 39.0-39.9,adult    Diabetes ([place])    on Trulicity   Family history of pancreatic cancer    Family history of peritoneal cancer    History of radiation therapy    Endometrium- HDR 12/16/21-01/15/20- Dr. Gery Pray   Hypertension    PONV (postoperative nausea and vomiting)    Sleep apnea    C PAP    Past Surgical History:  Procedure Laterality Date   CHOLECYSTECTOMY  2004   LYMPH NODE BIOPSY N/A 10/22/2021   Procedure: Sentinel LYMPH NODE BIOPSY;  Surgeon: [name], MD;  Location: [facility];  Service: Gynecology;  Laterality: N/A;   ROBOTIC ASSISTED TOTAL HYSTERECTOMY WITH BILATERAL SALPINGO  OOPHERECTOMY Bilateral 10/22/2021   Procedure: XI ROBOTIC ASSISTED TOTAL HYSTERECTOMY WITH BILATERAL SALPINGO OOPHORECTOMY;  Surgeon: [name], MD;  Location: [facility];  Service: Gynecology;  Laterality: Bilateral;     Family History  Problem Relation Age of Onset   Cancer Mother 57       primary peritoneal cancer   Alzheimer's disease Mother    Heart disease Father    Pancreatic cancer Father 22   Hypertension Brother    Heart attack Brother 41       MI x 2   Breast cancer Paternal Aunt 5   Diabetes Paternal Grandmother    Colon cancer Neg Hx    Prostate cancer Neg Hx    Ovarian cancer Neg Hx     Social History   Socioeconomic History   Marital status: Married    Spouse name: Not on file   Number of children: Not on file   Years of education: Not on file   Highest education level: Not on file  Occupational History   Occupation: retired Product manager: RETIRED  Tobacco Use   Smoking status: Never   Smokeless tobacco: Never  Vaping Use   Vaping Use: Never used  Substance and Sexual Activity   Alcohol use: Yes    Alcohol/week: 4.0 standard drinks of alcohol    Types: 4 Standard drinks or equivalent per week    Comment: occas   Drug use: No   Sexual activity: Not Currently    Birth control/protection: Post-menopausal    Comment: 1st intercourse 71 yo.---Fewer than 5 partners  Other Topics Concern   Not on file  Social History Narrative   Retired from being a Education officer, museum.    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 years    0 children    Social Determinants of Health   Financial Resource Strain: Low Risk  (01/13/2022)   Overall Financial Resource Strain (CARDIA)    Difficulty of Paying Living Expenses: Not hard at all  Food Insecurity: No Food Insecurity (01/13/2022)   Hunger Vital Sign    Worried About Running Out of Food in the Last Year: Never true    Ran Out of Food in the Last Year: Never true  Transportation Needs: No Transportation Needs (01/13/2022)   PRAPARE - Hydrologist (Medical): No    Lack of Transportation (Non-Medical): No  Physical Activity: Inactive (01/13/2022)   Exercise Vital Sign    Days of Exercise per Week: 0 days    Minutes of  Exercise per Session: 0 min  Stress: No Stress Concern Present (01/13/2022)   [place]    Feeling of Stress : Not at all  Social Connections: [place] (12/18/2020)   Social Connection and Isolation Panel [NHANES]    Frequency of Communication with Friends and Family: Three times a week    Frequency of Social Gatherings with Friends and Family: Three times a week    Attends Religious Services: More than 4 times per year    Active Member of Clubs or Organizations: Yes    Attends Archivist Meetings: More than 4 times per year    Marital Status: Married    Current Medications:  Current Outpatient Medications:    acetaminophen (TYLENOL) 500 MG tablet, Take 500-1,000 mg by mouth every 6 (six) hours as needed (pain.)., Disp: , Rfl:    aspirin EC 81 MG tablet, Take 81 mg by mouth in the  morning., Disp: , Rfl:    Cholecalciferol (VITAMIN D3 PO), Take 1 tablet by mouth 2 (two) times a week., Disp: , Rfl:    Continuous Blood Gluc Sensor (FREESTYLE LIBRE 3 SENSOR) MISC, 1 Device by Does not apply route every 14 (fourteen) days. Place 1 sensor on the skin every 14 days. Use to check glucose continuously, Disp: 2 each, Rfl: 6   Dulaglutide (TRULICITY) 1.5 HY/0.7PX SOPN, Inject 1.5 mg into the skin once a week., Disp: 6 mL, Rfl: 1   ibuprofen (ADVIL) 600 MG tablet, Take 1 tablet (600 mg total) by mouth every 6 (six) hours as needed for moderate pain. For AFTER surgery only, Disp: 30 tablet, Rfl: 0   losartan-hydrochlorothiazide (HYZAAR) 50-12.5 MG tablet, Take 1 tablet by mouth daily., Disp: 90 tablet, Rfl: 1   rosuvastatin (CRESTOR) 10 MG tablet, Take 1 tablet (10 mg total) by mouth daily., Disp: 90 tablet, Rfl: 3   Semaglutide-Weight Management (WEGOVY) 0.25 MG/0.5ML SOAJ, Inject 0.25 mg into the skin once a week., Disp: 2 mL, Rfl: 0  Review of Systems: Denies appetite changes, fevers, chills, fatigue, unexplained  weight changes. Denies hearing loss, neck lumps or masses, mouth sores, ringing in ears or voice changes. Denies cough or wheezing.  Denies shortness of breath. Denies chest pain or palpitations. Denies leg swelling. Denies abdominal distention, pain, blood in stools, constipation, diarrhea, nausea, vomiting, or early satiety. Denies pain with intercourse, dysuria, frequency, hematuria or incontinence. Denies hot flashes, pelvic pain, vaginal bleeding or vaginal discharge.   Denies joint pain, back pain or muscle pain/cramps. Denies itching, rash, or wounds. Denies dizziness, headaches, numbness or seizures. Denies swollen lymph nodes or glands, denies easy bruising or bleeding. Denies anxiety, depression, confusion, or decreased concentration.  Physical Exam: BP (!) 154/72 (BP Location: Left Arm, Patient Position: Sitting)   Pulse 95   Temp 98.8 F (37.1 C)   Resp 18   Wt 242 lb 2 oz (109.8 kg)   SpO2 99%   BMI 42.22 kg/m  General: Alert, oriented, no acute distress. HEENT: Normocephalic, atraumatic, sclera anicteric. Chest: Clear to auscultation bilaterally.  No wheezes or rhonchi. Cardiovascular: Regular rate and rhythm, no murmurs. Abdomen: Obese, soft, nontender.  Normoactive bowel sounds.  No masses or hepatosplenomegaly appreciated.  Well-healed incisions. Extremities: Grossly normal range of motion.  Warm, well perfused.  No edema bilaterally. Skin: No rashes or lesions noted. Lymphatics: No cervical, supraclavicular, or inguinal adenopathy. GU: Normal appearing external genitalia without erythema, excoriation, or lesions.  Speculum exam reveals moderately atrophic vaginal mucosa, no lesions, blood or discharge.  Bimanual exam reveals vagina and cuff are smooth, no masses or nodularity.  Rectovaginal exam confirms these findings.  Laboratory & Radiologic Studies: CT A/P on 02/25/22: 1. Post hysterectomy without evidence of recurrent disease. 2. Small nodules in the  anterior upper abdomen are stable from prior exam, all less than 5 mm short axis. Recommend continued attention at follow-up. There is otherwise no evidence of metastatic disease. 3. Colonic diverticulosis without diverticulitis.  Assessment & Plan: Kylie Davis is a 71 y.o. 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  Patient is doing well and is NED on exam today.  Plan for CT in April to reassess upper abdominal nodules seen on her postoperative CT scan.  These were stable on her last exam.  Per NCCN surveillance recommendations, we will continue with surveillance visits every 3 months, alternating between our office and radiation oncology.  Discussed signs  and symptoms that should prompt a phone call between visits.  20 minutes of total time was spent for this patient encounter, including preparation, face-to-face counseling with the patient and coordination of care, and documentation of the encounter.  Jeral Pinch, MD  Division of Gynecologic Oncology  Department of Obstetrics and Gynecology  [facility] [facility]

## 2022-04-21 NOTE — Patient Instructions (Signed)
It was good to see you today.  I do not see or feel any evidence of cancer recurrence on your exam.  I will see you for follow-up in 6 months.  We will get a CT scan in April.  As always, if you develop any new and concerning symptoms before your next visit, please call to see me sooner.

## 2022-05-12 DIAGNOSIS — J09X2 Influenza due to identified novel influenza A virus with other respiratory manifestations: Secondary | ICD-10-CM | POA: Diagnosis not present

## 2022-05-14 ENCOUNTER — Ambulatory Visit
Admission: RE | Admit: 2022-05-14 | Discharge: 2022-05-14 | Disposition: A | Discharge status: Home / Self Care | Payer: Medicare PPO | Source: Ambulatory Visit | Attending: Adult Health | Admitting: Adult Health

## 2022-05-14 DIAGNOSIS — Z1231 Encounter for screening mammogram for malignant neoplasm of breast: Secondary | ICD-10-CM | POA: Diagnosis not present

## 2022-06-08 DIAGNOSIS — H43811 Vitreous degeneration, right eye: Secondary | ICD-10-CM | POA: Diagnosis not present

## 2022-07-01 ENCOUNTER — Telehealth: Payer: Self-pay | Admitting: *Deleted

## 2022-07-01 NOTE — Telephone Encounter (Signed)
RETURNED PATIENT'S PHONE CALL, SPOKE WITH PATIENT. ?

## 2022-07-06 DIAGNOSIS — H43811 Vitreous degeneration, right eye: Secondary | ICD-10-CM | POA: Diagnosis not present

## 2022-08-08 DIAGNOSIS — R21 Rash and other nonspecific skin eruption: Secondary | ICD-10-CM | POA: Diagnosis not present

## 2022-08-10 ENCOUNTER — Ambulatory Visit: Payer: Self-pay | Admitting: Radiation Oncology

## 2022-08-18 ENCOUNTER — Ambulatory Visit (HOSPITAL_COMMUNITY): Payer: Medicare PPO

## 2022-08-23 DIAGNOSIS — J029 Acute pharyngitis, unspecified: Secondary | ICD-10-CM | POA: Diagnosis not present

## 2022-08-23 DIAGNOSIS — R350 Frequency of micturition: Secondary | ICD-10-CM | POA: Diagnosis not present

## 2022-08-23 DIAGNOSIS — B349 Viral infection, unspecified: Secondary | ICD-10-CM | POA: Diagnosis not present

## 2022-08-25 ENCOUNTER — Other Ambulatory Visit: Payer: Self-pay | Admitting: Adult Health

## 2022-08-25 DIAGNOSIS — I10 Essential (primary) hypertension: Secondary | ICD-10-CM

## 2022-08-26 ENCOUNTER — Ambulatory Visit: Payer: Medicare PPO | Admitting: Radiation Oncology

## 2022-08-27 ENCOUNTER — Encounter (HOSPITAL_COMMUNITY): Payer: Self-pay

## 2022-08-27 ENCOUNTER — Ambulatory Visit (HOSPITAL_COMMUNITY)
Admission: RE | Admit: 2022-08-27 | Discharge: 2022-08-27 | Disposition: A | Discharge status: Home / Self Care | Payer: Medicare PPO | Source: Ambulatory Visit | Attending: Gynecologic Oncology | Admitting: Gynecologic Oncology

## 2022-08-27 DIAGNOSIS — C541 Malignant neoplasm of endometrium: Secondary | ICD-10-CM | POA: Insufficient documentation

## 2022-08-27 DIAGNOSIS — K429 Umbilical hernia without obstruction or gangrene: Secondary | ICD-10-CM | POA: Diagnosis not present

## 2022-08-27 DIAGNOSIS — K5730 Diverticulosis of large intestine without perforation or abscess without bleeding: Secondary | ICD-10-CM | POA: Diagnosis not present

## 2022-08-27 MED ORDER — IOHEXOL 9 MG/ML PO SOLN
ORAL | Status: AC
  Filled 2022-08-27: qty 1000

## 2022-08-27 MED ORDER — SODIUM CHLORIDE (PF) 0.9 % IJ SOLN
INTRAMUSCULAR | Status: AC
  Filled 2022-08-27: qty 50

## 2022-08-27 MED ORDER — IOHEXOL 9 MG/ML PO SOLN
500.0000 mL | ORAL | Status: AC
  Administered 2022-08-27 (×2): 500 mL via ORAL

## 2022-08-27 MED ORDER — IOHEXOL 300 MG/ML  SOLN
100.0000 mL | Freq: Once | INTRAMUSCULAR | Status: AC | PRN
  Administered 2022-08-27: 100 mL via INTRAVENOUS

## 2022-09-02 NOTE — Progress Notes (Signed)
Radiation Oncology         [phone] ________________________________  Name: Kylie Davis MRN: [id number]  Date: 09/03/2022  DOB: 1950/10/17  Follow-Up Visit Note  CC: Shirline Frees, NP  Carver Fila, MD  No diagnosis found.  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 She was last seen here for follow up on 02/19/22. Since her last visit, the patient followed up with Dr. Pricilla Holm on 04/21/22. During which time, the patient denied any symptoms concerning for disease recurrence and she was noted as NED on examination. She was also noted to report using her vaginal dilator regularly.             Pertinent imaging performed in the interval since the patient was last seen includes:  -- CT AP with contrast on 02/25/22 which showed stability of the small nodules in the anterior upper abdomen, all of which measure less than 5 mm in the short axis. No evidence of disease recurrence was appreciated.           -- Bilateral screening mammogram on 05/14/22 showed no evidence of malignancy in either breast.  -- CT AP with contrast on 08/27/22 showed no evidence of abdominal or pelvic metastatic disease. The small anterior upper abdomen nodules again appeared to be stable. No new peritoneal nodular lesions were identified.     ***     Allergies:  has No Known Allergies.  Meds: Current Outpatient Medications  Medication Sig Dispense Refill   acetaminophen (TYLENOL) 500 MG tablet Take 500-1,000 mg by mouth every 6 (six) hours as needed (pain.).     aspirin EC 81 MG tablet Take 81 mg by mouth in the morning.     Cholecalciferol (VITAMIN D3 PO) Take  1 tablet by mouth 2 (two) times a week.     Continuous Blood Gluc Sensor (FREESTYLE LIBRE 3 SENSOR) MISC 1 Device by Does not apply route every 14 (fourteen) days. Place 1 sensor on the skin every 14 days. Use to check glucose continuously 2 each 6   ibuprofen (ADVIL) 600 MG tablet Take 1 tablet (600 mg total) by mouth every 6 (six) hours as needed for moderate pain. For AFTER surgery only 30 tablet 0   losartan-hydrochlorothiazide (HYZAAR) 50-12.5 MG tablet TAKE 1 TABLET BY MOUTH DAILY 90 tablet 1   rosuvastatin (CRESTOR) 10 MG tablet Take 1 tablet (10 mg total) by mouth daily. 90 tablet 3   Semaglutide-Weight Management (WEGOVY) 0.25 MG/0.5ML SOAJ Inject 0.25 mg into the skin once a week. 2 mL 0   No current facility-administered medications for this encounter.    Physical Findings: The patient is in no acute distress. Patient is alert and oriented.  vitals were not taken for this visit. .  No significant changes. Lungs are clear to auscultation bilaterally. Heart has regular rate and rhythm. No palpable cervical, supraclavicular, or axillary adenopathy. Abdomen soft, non-tender, normal bowel sounds.  On pelvic examination the external genitalia were unremarkable. A speculum exam was performed. There are no mucosal lesions noted in the vaginal vault. A Pap smear was obtained of the proximal vagina. On bimanual and rectovaginal examination there were no pelvic masses appreciated. ***  Lab Findings: Lab Results  Component Value Date   WBC 6.1 04/01/2022   HGB 14.0 04/01/2022   HCT 42.1 04/01/2022   MCV 88.5 04/01/2022   PLT 280.0 04/01/2022    Radiographic Findings: CT Abdomen Pelvis W Contrast  Addendum Date: 08/31/2022   ADDENDUM REPORT: 08/31/2022 11:24 ADDENDUM: Nodular soft tissue density foci anterior to the liver measuring 0.7 cm on axial image 15, 0.3 cm on axial image 21 and 0.4 cm on axial image 19 represent a stable finding when measured in the same manner as on the prior  study. Tiny nodular foci in the omentum anteriorly on the left measuring 0.4 cm on image 12 and 0.2 cm on image 11 also stable findings. No new peritoneal nodular lesions identified. ADDITIONAL IMPRESSION: Subcentimeter soft tissue nodular foci in the omentum anteriorly represent a stable finding from the previous study. Electronically Signed   By: Layla Maw M.D.   On: 08/31/2022 11:24   Result Date: 08/31/2022 CLINICAL DATA:  Endometrial cancer status post XRT. EXAM: CT ABDOMEN AND PELVIS WITH CONTRAST TECHNIQUE: Multidetector CT imaging of the abdomen and pelvis was performed using the standard protocol following bolus administration of intravenous contrast. RADIATION DOSE REDUCTION: This exam was performed according to the departmental dose-optimization program which includes automated exposure control, adjustment of the mA and/or kV according to patient size and/or use of iterative reconstruction technique. CONTRAST:  OMNIPAQUE IOHEXOL 300 MG/ML  SOLN COMPARISON:  02/25/2022 FINDINGS: Lower chest: Linear bibasilar subsegmental atelectasis or scarring. No pericardial or pleural effusion. Hepatobiliary: No focal liver abnormality is seen. Status post cholecystectomy. No biliary dilatation. Pancreas: Unremarkable. No pancreatic ductal dilatation or surrounding inflammatory changes. Spleen: Normal in size without focal abnormality. Adrenals/Urinary Tract: Adrenal glands are unremarkable. Kidneys are normal, without renal calculi, focal lesion, or hydronephrosis. Bladder is unremarkable. Stomach/Bowel: Stomach is within normal limits. Appendix appears normal. No evidence of bowel wall thickening, distention, or inflammatory changes. Diverticula of the sigmoid colon. Vascular/Lymphatic: No significant vascular findings are present. No enlarged abdominal or pelvic lymph nodes. Reproductive: Status post hysterectomy. No adnexal masses. Other: Small fat containing periumbilical abdominal wall defect  without incarceration or herniation of bowel. No abdominopelvic ascites. Musculoskeletal: Lumbosacral degenerative changes. No acute osseous abnormalities. IMPRESSION: No evidence of abdominal or pelvic metastatic disease. No acute abdominal or pelvic pathology identified. Electronically Signed: By: Layla Maw M.D. 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  ***  Plan:  ***   *** minutes of total time was spent for this patient encounter, including preparation, face-to-face counseling with the patient and coordination of care, physical exam, and documentation of the encounter. ____________________________________  Billie Lade, PhD, MD  This document serves as a record of services personally performed by Antony Blackbird, MD. It was created on his behalf by Neena Rhymes, a trained medical scribe. The creation of this record is based on the scribe's personal observations and the provider's statements to them. This document has been checked and approved by the attending provider.

## 2022-09-03 ENCOUNTER — Encounter: Payer: Self-pay | Admitting: Radiation Oncology

## 2022-09-03 ENCOUNTER — Ambulatory Visit
Admission: RE | Admit: 2022-09-03 | Discharge: 2022-09-03 | Disposition: A | Discharge status: Home / Self Care | Payer: Medicare PPO | Source: Ambulatory Visit | Attending: Radiation Oncology | Admitting: Radiation Oncology

## 2022-09-03 VITALS — BP 146/74 | HR 79 | Temp 98.0°F | Resp 20 | Wt 244.2 lb

## 2022-09-03 DIAGNOSIS — Z923 Personal history of irradiation: Secondary | ICD-10-CM | POA: Diagnosis not present

## 2022-09-03 DIAGNOSIS — C541 Malignant neoplasm of endometrium: Secondary | ICD-10-CM | POA: Insufficient documentation

## 2022-09-03 DIAGNOSIS — Z79899 Other long term (current) drug therapy: Secondary | ICD-10-CM | POA: Diagnosis not present

## 2022-09-03 DIAGNOSIS — M47817 Spondylosis without myelopathy or radiculopathy, lumbosacral region: Secondary | ICD-10-CM | POA: Insufficient documentation

## 2022-09-03 DIAGNOSIS — K5730 Diverticulosis of large intestine without perforation or abscess without bleeding: Secondary | ICD-10-CM | POA: Diagnosis not present

## 2022-09-03 NOTE — Progress Notes (Signed)
Kylie Davis is here today for follow up post radiation to the pelvic.  They completed their radiation on: 01/14/22   Does the patient complain of any of the following:  Pain: Yes Abdominal bloating: Painful gas since completing CT scan with contrast.  Diarrhea/Constipation: No Nausea/Vomiting: No Vaginal Discharge: No Blood in Urine or Stool: No Urinary Issues (dysuria/incomplete emptying/ incontinence/ increased frequency/urgency): No Does patient report using vaginal dilator 2-3 times a week and/or sexually active 2-3 weeks: Yes patient using dilators as directed.  Post radiation skin changes: No   Additional comments if applicable: BP (!) 146/74   Pulse 79   Temp 98 F (36.7 C)   Resp 20   Wt 244 lb 3.2 oz (110.8 kg)   SpO2 97%   BMI 42.58 kg/m

## 2022-09-09 DIAGNOSIS — H5203 Hypermetropia, bilateral: Secondary | ICD-10-CM | POA: Diagnosis not present

## 2022-09-16 DIAGNOSIS — Z872 Personal history of diseases of the skin and subcutaneous tissue: Secondary | ICD-10-CM | POA: Diagnosis not present

## 2022-09-16 DIAGNOSIS — L821 Other seborrheic keratosis: Secondary | ICD-10-CM | POA: Diagnosis not present

## 2022-09-16 DIAGNOSIS — L814 Other melanin hyperpigmentation: Secondary | ICD-10-CM | POA: Diagnosis not present

## 2022-09-16 DIAGNOSIS — Z09 Encounter for follow-up examination after completed treatment for conditions other than malignant neoplasm: Secondary | ICD-10-CM | POA: Diagnosis not present

## 2022-09-16 DIAGNOSIS — L570 Actinic keratosis: Secondary | ICD-10-CM | POA: Diagnosis not present

## 2022-09-16 DIAGNOSIS — D225 Melanocytic nevi of trunk: Secondary | ICD-10-CM | POA: Diagnosis not present

## 2022-10-22 ENCOUNTER — Encounter: Payer: Self-pay | Admitting: Gynecologic Oncology

## 2022-10-22 NOTE — Progress Notes (Signed)
Gynecologic Oncology Return Clinic Visit  10/23/22  Reason for Visit: surveillance in the setting of endometrial cancer   Treatment History: Oncology History  Endometrial cancer (HCC)  09/25/2021 Initial Biopsy   EMB - grade 1 endometrioid adenocarcinoma, MMR IHC intact   10/06/2021 Initial Diagnosis   Endometrial cancer (HCC)   10/22/2021 Surgery   TRH/BSO, SLN bilaterally, LOA  Findings: On EUA, small mobile uterus. On intra-abdominal entry, normal upper abdominal survey. Some adhesions of the omentum to the left abdominal side wall in the upper abdomen. Some adhesions of the ascending colon to the right abdominal side wall and of the cecum to the right IP ligament. 8 cm uterus, normal in appearance. Normal appearing. Bilateral adnexa. No gross adenopathy. Mapping successful to bilateral obturator SLNs. Sigmoid colon adherent to the left IP ligament and left pelvic sidewall.   10/22/2021 Pathology Results   Stage IB, grade 1 MI 63% (difficult to determine given adenomyosis) LVI negative SLN on right negative, no definitive SLN seen in tissue excised on left (lymphoid cells negative for malignancy)   11/21/2021 Imaging   CT A/P: 1. Status post hysterectomy and bilateral salpingo-oophorectomy. 2. There are a few small nodules noted in the anterior abdomen adjacent to the left hepatic lobe, all measuring less than 5 mm. Recommend attention on future studies. A follow-up CT abdomen/pelvis in 4-6 months is recommended. 3. Status post cholecystectomy without biliary dilatation.   12/16/2021 - 01/14/2022 Radiation Therapy   12/16/2021 through 01/14/2022 Site Technique Total Dose (Gy) Dose per Fx (Gy) Completed Fx Beam Energies  Vagina: Pelvis HDR-brachy 30/30 6 5/5 Ir-192        01/01/2022 Genetic Testing   Negative genetic testing on the CancerNext-Expanded+RNAinsight panel.  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  The report date is 01/01/2022.  The CancerNext-Expanded gene panel offered  by [facility] and includes sequencing and rearrangement analysis for the following 77 genes: AIP, ALK, APC*, ATM*, AXIN2, BAP1, BARD1, BLM, BMPR1A, BRCA1*, BRCA2*, BRIP1*, CDC73, CDH1*, CDK4, CDKN1B, CDKN2A, CHEK2*, CTNNA1, DICER1, FANCC, FH, FLCN, GALNT12, KIF1B, LZTR1, MAX, MEN1, MET, MLH1*, MSH2*, MSH3, MSH6*, MUTYH*, NBN, NF1*, NF2, NTHL1, PALB2*, PHOX2B, PMS2*, POT1, PRKAR1A, PTCH1, PTEN*, RAD51C*, RAD51D*, RB1, RECQL, RET, SDHA, SDHAF2, SDHB, SDHC, SDHD, SMAD4, SMARCA4, SMARCB1, SMARCE1, STK11, SUFU, TMEM127, TP53*, TSC1, TSC2, VHL and XRCC2 (sequencing and deletion/duplication); EGFR, EGLN1, HOXB13, KIT, MITF, PDGFRA, POLD1, and POLE (sequencing only); EPCAM and GREM1 (deletion/duplication only). DNA and RNA analyses performed for * genes.      Interval History: Doing well.  Denies any vaginal bleeding or discharge.  Has been using her vaginal dilator regularly.  Has some urinary incontinence, which predated her surgery.  Wears a pad for this, is not very bothered by it.  Has both urge urinary incontinence and some stress urinary incontinence.  Endorses normal bowel function.  Past Medical/Surgical History: Past Medical History:  Diagnosis Date   Arthritis    BMI 39.0-39.9,adult    Diabetes (HCC)    on Trulicity   endometrial ca 09/2021   Family history of pancreatic cancer    Family history of peritoneal cancer    History of radiation therapy    Endometrium- HDR 12/16/21-01/15/20- Dr. Antony Blackbird   Hypertension    PONV (postoperative nausea and vomiting)    Sleep apnea    C PAP    Past Surgical History:  Procedure Laterality Date   CHOLECYSTECTOMY  2004   LYMPH NODE BIOPSY N/A 10/22/2021   Procedure: Sentinel LYMPH NODE BIOPSY;  Surgeon:  Carver Fila, MD;  Location: [facility];  Service: Gynecology;  Laterality: N/A;   ROBOTIC ASSISTED TOTAL HYSTERECTOMY WITH BILATERAL SALPINGO OOPHERECTOMY Bilateral 10/22/2021   Procedure: XI ROBOTIC ASSISTED TOTAL HYSTERECTOMY WITH  BILATERAL SALPINGO OOPHORECTOMY;  Surgeon: Carver Fila, MD;  Location: [facility];  Service: Gynecology;  Laterality: Bilateral;    Family History  Problem Relation Age of Onset   Cancer Mother 47       primary peritoneal cancer   Alzheimer's disease Mother    Heart disease Father    Pancreatic cancer Father 63   Hypertension Brother    Heart attack Brother 39       MI x 2   Breast cancer Paternal Aunt 68   Diabetes Paternal Grandmother    Colon cancer Neg Hx    Prostate cancer Neg Hx    Ovarian cancer Neg Hx     Social History   Socioeconomic History   Marital status: Married    Spouse name: Not on file   Number of children: Not on file   Years of education: Not on file   Highest education level: Not on file  Occupational History   Occupation: retired Magazine features editor: RETIRED  Tobacco Use   Smoking status: Never   Smokeless tobacco: Never  Vaping Use   Vaping Use: Never used  Substance and Sexual Activity   Alcohol use: Yes    Alcohol/week: 4.0 standard drinks of alcohol    Types: 4 Standard drinks or equivalent per week    Comment: occas   Drug use: No   Sexual activity: Not Currently    Birth control/protection: Post-menopausal    Comment: 1st intercourse 72 yo.---Fewer than 5 partners  Other Topics Concern   Not on file  Social History Narrative   Retired from being a Engineer, site.    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 years    0 children    Social Determinants of Health   Financial Resource Strain: Low Risk  (01/13/2022)   Overall Financial Resource Strain (CARDIA)    Difficulty of Paying Living Expenses: Not hard at all  Food Insecurity: No Food Insecurity (01/13/2022)   Hunger Vital Sign    Worried About Running Out of Food in the Last Year: Never true    Ran Out of Food in the Last Year: Never true  Transportation Needs: No Transportation Needs (01/13/2022)   PRAPARE - Administrator, Civil Service (Medical): No    Lack of Transportation  (Non-Medical): No  Physical Activity: Inactive (01/13/2022)   Exercise Vital Sign    Days of Exercise per Week: 0 days    Minutes of Exercise per Session: 0 min  Stress: No Stress Concern Present (01/13/2022)   [employer] of Occupational Health - Occupational Stress Questionnaire    Feeling of Stress : Not at all  Social Connections: Socially Integrated (12/18/2020)   Social Connection and Isolation Panel [NHANES]    Frequency of Communication with Friends and Family: Three times a week    Frequency of Social Gatherings with Friends and Family: Three times a week    Attends Religious Services: More than 4 times per year    Active Member of Clubs or Organizations: Yes    Attends Banker Meetings: More than 4 times per year    Marital Status: Married    Current Medications:  Current Outpatient Medications:    acetaminophen (TYLENOL) 500 MG tablet, Take 500-1,000 mg by mouth every 6 (  six) hours as needed (pain.)., Disp: , Rfl:    aspirin EC 81 MG tablet, Take 81 mg by mouth in the morning., Disp: , Rfl:    Cholecalciferol (VITAMIN D3 PO), Take 1 tablet by mouth 2 (two) times a week., Disp: , Rfl:    Continuous Blood Gluc Sensor (FREESTYLE LIBRE 3 SENSOR) MISC, 1 Device by Does not apply route every 14 (fourteen) days. Place 1 sensor on the skin every 14 days. Use to check glucose continuously, Disp: 2 each, Rfl: 6   ibuprofen (ADVIL) 600 MG tablet, Take 1 tablet (600 mg total) by mouth every 6 (six) hours as needed for moderate pain. For AFTER surgery only, Disp: 30 tablet, Rfl: 0   losartan-hydrochlorothiazide (HYZAAR) 50-12.5 MG tablet, TAKE 1 TABLET BY MOUTH DAILY, Disp: 90 tablet, Rfl: 1   rosuvastatin (CRESTOR) 10 MG tablet, Take 1 tablet (10 mg total) by mouth daily. (Patient not taking: Reported on 09/03/2022), Disp: 90 tablet, Rfl: 3   Semaglutide-Weight Management (WEGOVY) 0.25 MG/0.5ML SOAJ, Inject 0.25 mg into the skin once a week. (Patient not taking: Reported on  09/03/2022), Disp: 2 mL, Rfl: 0  Review of Systems: + incontinence Denies appetite changes, fevers, chills, fatigue, unexplained weight changes. Denies hearing loss, neck lumps or masses, mouth sores, ringing in ears or voice changes. Denies cough or wheezing.  Denies shortness of breath. Denies chest pain or palpitations. Denies leg swelling. Denies abdominal distention, pain, blood in stools, constipation, diarrhea, nausea, vomiting, or early satiety. Denies pain with intercourse, dysuria, frequency, hematuria. Denies hot flashes, pelvic pain, vaginal bleeding or vaginal discharge.   Denies joint pain, back pain or muscle pain/cramps. Denies itching, rash, or wounds. Denies dizziness, headaches, numbness or seizures. Denies swollen lymph nodes or glands, denies easy bruising or bleeding. Denies anxiety, depression, confusion, or decreased concentration.  Physical Exam: BP 137/66 (BP Location: Left Arm, Patient Position: Sitting)   Pulse 77   Temp 98 F (36.7 C) (Rectal)   Resp 20   Ht 5' 4.96" (1.65 m)   Wt 242 lb 3.2 oz (109.9 kg)   SpO2 98%   BMI 40.35 kg/m  General: Alert, oriented, no acute distress. HEENT: Normocephalic, atraumatic, sclera anicteric. Chest: Clear to auscultation bilaterally.  No wheezes or rhonchi. Cardiovascular: Regular rate and rhythm, no murmurs. Abdomen: Obese, soft, nontender.  Normoactive bowel sounds.  No masses or hepatosplenomegaly appreciated.  Well-healed incisions. Extremities: Grossly normal range of motion.  Warm, well perfused.  No edema bilaterally. Skin: No rashes or lesions noted. Lymphatics: No cervical, supraclavicular, or inguinal adenopathy. GU: Normal appearing external genitalia without erythema, excoriation, or lesions.  Speculum exam reveals moderately atrophic vaginal mucosa, no lesions, blood or discharge.  Bimanual exam reveals vagina and cuff are smooth, no masses or nodularity.  Rectovaginal exam confirms these  findings.  Laboratory & Radiologic Studies: CT A/P 08/31/22: No evidence of abdominal or pelvic metastatic disease. No acute abdominal or pelvic pathology identified. Subcentimeter soft tissue nodular foci in the omentum anteriorly represent a stable finding from the previous study.  Assessment & Plan: Kylie Davis is a 72 y.o. 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   Patient is doing well and is NED on exam today.   CT in April shows stable soft tissue foci in the upper abdomen.  Will plan for repeat scan approximately 1 year from last.  If lesions continue to be stable, will discontinue surveillance imaging.   Per NCCN surveillance recommendations,  we will continue with surveillance visits every 3 months, alternating between our office and radiation oncology.  She is scheduled to see Dr. Roselind Messier in September. We discussed signs and symptoms that should prompt a phone call between visits.  20 minutes of total time was spent for this patient encounter, including preparation, face-to-face counseling with the patient and coordination of care, and documentation of the encounter.  Eugene Garnet, MD  Division of Gynecologic Oncology  Department of Obstetrics and Gynecology  [facility]

## 2022-10-23 ENCOUNTER — Other Ambulatory Visit: Payer: Self-pay

## 2022-10-23 ENCOUNTER — Inpatient Hospital Stay: Payer: Medicare PPO | Attending: Gynecologic Oncology | Admitting: Gynecologic Oncology

## 2022-10-23 ENCOUNTER — Encounter: Payer: Self-pay | Admitting: Gynecologic Oncology

## 2022-10-23 VITALS — BP 137/66 | HR 77 | Temp 98.0°F | Resp 20 | Ht 64.96 in | Wt 242.2 lb

## 2022-10-23 DIAGNOSIS — C541 Malignant neoplasm of endometrium: Secondary | ICD-10-CM

## 2022-10-23 DIAGNOSIS — R935 Abnormal findings on diagnostic imaging of other abdominal regions, including retroperitoneum: Secondary | ICD-10-CM | POA: Diagnosis not present

## 2022-10-23 DIAGNOSIS — Z803 Family history of malignant neoplasm of breast: Secondary | ICD-10-CM | POA: Insufficient documentation

## 2022-10-23 DIAGNOSIS — Z8542 Personal history of malignant neoplasm of other parts of uterus: Secondary | ICD-10-CM | POA: Insufficient documentation

## 2022-10-23 DIAGNOSIS — Z90710 Acquired absence of both cervix and uterus: Secondary | ICD-10-CM | POA: Diagnosis not present

## 2022-10-23 DIAGNOSIS — Z808 Family history of malignant neoplasm of other organs or systems: Secondary | ICD-10-CM | POA: Diagnosis not present

## 2022-10-23 DIAGNOSIS — Z923 Personal history of irradiation: Secondary | ICD-10-CM | POA: Diagnosis not present

## 2022-10-23 DIAGNOSIS — Z90722 Acquired absence of ovaries, bilateral: Secondary | ICD-10-CM | POA: Diagnosis not present

## 2022-10-23 NOTE — Patient Instructions (Signed)
It was good to see you today.  I do not see or feel any evidence of cancer recurrence on your exam.  I will see you for follow-up in 6 months.  As always, if you develop any new and concerning symptoms before your next visit, please call to see me sooner.   

## 2022-11-12 ENCOUNTER — Other Ambulatory Visit: Payer: Self-pay | Admitting: Adult Health

## 2022-11-26 ENCOUNTER — Encounter: Payer: Self-pay | Admitting: Adult Health

## 2022-11-26 ENCOUNTER — Ambulatory Visit: Payer: Medicare PPO | Admitting: Adult Health

## 2022-11-26 VITALS — BP 120/60 | HR 75 | Temp 98.4°F | Ht 64.5 in | Wt 242.0 lb

## 2022-11-26 DIAGNOSIS — Z6841 Body Mass Index (BMI) 40.0 and over, adult: Secondary | ICD-10-CM | POA: Diagnosis not present

## 2022-11-26 DIAGNOSIS — E669 Obesity, unspecified: Secondary | ICD-10-CM

## 2022-11-26 DIAGNOSIS — R7303 Prediabetes: Secondary | ICD-10-CM

## 2022-11-26 DIAGNOSIS — I10 Essential (primary) hypertension: Secondary | ICD-10-CM | POA: Diagnosis not present

## 2022-11-26 MED ORDER — WEGOVY 0.25 MG/0.5ML ~~LOC~~ SOAJ
0.2500 mg | SUBCUTANEOUS | 0 refills | Status: AC
Start: 2022-11-26 — End: ?

## 2022-11-26 NOTE — Patient Instructions (Signed)
Health Maintenance Due  Topic Date Due   OPHTHALMOLOGY EXAM  Never done   COVID-19 Vaccine (3 - Moderna risk series) 06/07/2020   HEMOGLOBIN A1C  09/30/2022   Medicare Annual Wellness (AWV)  01/14/2023   Colonoscopy  02/25/2023      Row Labels 01/13/2022    9:08 AM 04/08/2021    1:54 PM 12/18/2020    2:10 PM  Depression screen PHQ 2/9   Section Header. No data exists in this row.     Decreased Interest   0 0 0  Down, Depressed, Hopeless   0 0 0  PHQ - 2 Score   0 0 0  Altered sleeping    2   Tired, decreased energy    0   Change in appetite    0   Feeling bad or failure about yourself     0   Trouble concentrating    0   Moving slowly or fidgety/restless    0   Suicidal thoughts    0   PHQ-9 Score    2   Difficult doing work/chores    Not difficult at all

## 2022-11-26 NOTE — Progress Notes (Signed)
Subjective:    Patient ID: Kylie Davis, female    DOB: 1951-02-06, 72 y.o.   MRN: [id number]  HPI  72 year old female who  has a past medical history of Arthritis, BMI 39.0-39.9,adult, Diabetes (HCC), endometrial ca (09/2021), Family history of pancreatic cancer, Family history of peritoneal cancer, History of radiation therapy, Hypertension, PONV (postoperative nausea and vomiting), and Sleep apnea.  She presents to the office today for concern of hypotension. She reports recently she was working out side in the heat and she started to feel dizzy. She checked her blood pressure and it was 106/60. She has been monitoring her blood pressure at home and  has stabilized.   BP Readings from Last 3 Encounters:  11/26/22 120/60  10/23/22 137/66  09/03/22 (!) 146/74   She also has a history of prediabetes and obesity. She was prescribed Wegovy but could not get it when she was vacationing in the [place]. She would like to restart this medication.   Lab Results  Component Value Date   HGBA1C 6.4 04/01/2022   Wt Readings from Last 3 Encounters:  11/26/22 242 lb (109.8 kg)  10/23/22 242 lb 3.2 oz (109.9 kg)  09/03/22 244 lb 3.2 oz (110.8 kg)      Review of Systems See HPI   Past Medical History:  Diagnosis Date   Arthritis    BMI 39.0-39.9,adult    Diabetes (HCC)    on Trulicity   endometrial ca 09/2021   Family history of pancreatic cancer    Family history of peritoneal cancer    History of radiation therapy    Endometrium- HDR 12/16/21-01/15/20- Dr. Antony Blackbird   Hypertension    PONV (postoperative nausea and vomiting)    Sleep apnea    C PAP    Social History   Socioeconomic History   Marital status: Married    Spouse name: Not on file   Number of children: Not on file   Years of education: Not on file   Highest education level: Not on file  Occupational History   Occupation: retired Magazine features editor: RETIRED  Tobacco Use   Smoking status:  Never   Smokeless tobacco: Never  Vaping Use   Vaping status: Never Used  Substance and Sexual Activity   Alcohol use: Yes    Alcohol/week: 4.0 standard drinks of alcohol    Types: 4 Standard drinks or equivalent per week    Comment: occas   Drug use: No   Sexual activity: Not Currently    Birth control/protection: Post-menopausal    Comment: 1st intercourse 72 yo.---Fewer than 5 partners  Other Topics Concern   Not on file  Social History Narrative   Retired from being a Engineer, site.    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 years    0 children    Social Determinants of Health   Financial Resource Strain: Low Risk  (01/13/2022)   Overall Financial Resource Strain (CARDIA)    Difficulty of Paying Living Expenses: Not hard at all  Food Insecurity: No Food Insecurity (01/13/2022)   Hunger Vital Sign    Worried About Running Out of Food in the Last Year: Never true    Ran Out of Food in the Last Year: Never true  Transportation Needs: No Transportation Needs (01/13/2022)   PRAPARE - Administrator, Civil Service (Medical): No    Lack of Transportation (Non-Medical): No  Physical Activity: Inactive (01/13/2022)   Exercise Vital Sign  Days of Exercise per Week: 0 days    Minutes of Exercise per Session: 0 min  Stress: No Stress Concern Present (01/13/2022)   [employer] of Occupational Health - Occupational Stress Questionnaire    Feeling of Stress : Not at all  Social Connections: Socially Integrated (12/18/2020)   Social Connection and Isolation Panel [NHANES]    Frequency of Communication with Friends and Family: Three times a week    Frequency of Social Gatherings with Friends and Family: Three times a week    Attends Religious Services: More than 4 times per year    Active Member of Clubs or Organizations: Yes    Attends Banker Meetings: More than 4 times per year    Marital Status: Married  Catering manager Violence: Not At Risk (12/18/2020)   Humiliation, Afraid,  Rape, and Kick questionnaire    Fear of Current or Ex-Partner: No    Emotionally Abused: No    Physically Abused: No    Sexually Abused: No    Past Surgical History:  Procedure Laterality Date   CHOLECYSTECTOMY  2004   LYMPH NODE BIOPSY N/A 10/22/2021   Procedure: Sentinel LYMPH NODE BIOPSY;  Surgeon: Carver Fila, MD;  Location: [facility];  Service: Gynecology;  Laterality: N/A;   ROBOTIC ASSISTED TOTAL HYSTERECTOMY WITH BILATERAL SALPINGO OOPHERECTOMY Bilateral 10/22/2021   Procedure: XI ROBOTIC ASSISTED TOTAL HYSTERECTOMY WITH BILATERAL SALPINGO OOPHORECTOMY;  Surgeon: Carver Fila, MD;  Location: [facility];  Service: Gynecology;  Laterality: Bilateral;    Family History  Problem Relation Age of Onset   Cancer Mother 81       primary peritoneal cancer   Alzheimer's disease Mother    Heart disease Father    Pancreatic cancer Father 41   Hypertension Brother    Heart attack Brother 96       MI x 2   Breast cancer Paternal Aunt 75   Diabetes Paternal Grandmother    Colon cancer Neg Hx    Prostate cancer Neg Hx    Ovarian cancer Neg Hx     No Known Allergies  Current Outpatient Medications on File Prior to Visit  Medication Sig Dispense Refill   acetaminophen (TYLENOL) 500 MG tablet Take 500-1,000 mg by mouth every 6 (six) hours as needed (pain.).     aspirin EC 81 MG tablet Take 81 mg by mouth in the morning.     Cholecalciferol (VITAMIN D3 PO) Take 1 tablet by mouth 2 (two) times a week.     Continuous Glucose Sensor (FREESTYLE LIBRE 3 SENSOR) MISC PLACE 1 SENSOR ON THE SKIN EVERY 14 DAYS TO CHECK GLUCOSE CONTINUOUSLY 2 each 6   ibuprofen (ADVIL) 600 MG tablet Take 1 tablet (600 mg total) by mouth every 6 (six) hours as needed for moderate pain. For AFTER surgery only 30 tablet 0   losartan-hydrochlorothiazide (HYZAAR) 50-12.5 MG tablet TAKE 1 TABLET BY MOUTH DAILY 90 tablet 1   rosuvastatin (CRESTOR) 10 MG tablet Take 1 tablet (10 mg total) by mouth daily. 90 tablet 3    Semaglutide-Weight Management (WEGOVY) 0.25 MG/0.5ML SOAJ Inject 0.25 mg into the skin once a week. 2 mL 0   No current facility-administered medications on file prior to visit.    BP 120/60   Pulse 75   Temp 98.4 F (36.9 C) (Oral)   Ht 5' 4.5" (1.638 m)   Wt 242 lb (109.8 kg)   SpO2 96%   BMI 40.90 kg/m  Objective:   Physical Exam Vitals and nursing note reviewed.  Constitutional:      Appearance: Normal appearance. She is obese.  Cardiovascular:     Rate and Rhythm: Normal rate and regular rhythm.     Pulses: Normal pulses.     Heart sounds: Normal heart sounds.  Pulmonary:     Effort: Pulmonary effort is normal.     Breath sounds: Normal breath sounds.  Musculoskeletal:        General: Normal range of motion.  Skin:    General: Skin is warm and dry.  Neurological:     General: No focal deficit present.     Mental Status: She is alert and oriented to person, place, and time.  Psychiatric:        Mood and Affect: Mood normal.        Behavior: Behavior normal.        Thought Content: Thought content normal.        Judgment: Judgment normal.        Assessment & Plan:  1. Essential hypertension, benign - At goal. Likely drop in blood pressure from being outside in heat - Advised staying hydrated while outside   2. Pre-diabetes  - Semaglutide-Weight Management (WEGOVY) 0.25 MG/0.5ML SOAJ; Inject 0.25 mg into the skin once a week.  Dispense: 2 mL; Refill: 0 - Follow up in 1 month 3. Obesity, Class II, BMI 35-39.9  - Semaglutide-Weight Management (WEGOVY) 0.25 MG/0.5ML SOAJ; Inject 0.25 mg into the skin once a week.  Dispense: 2 mL; Refill: 0  Shirline Frees, NP

## 2022-12-03 ENCOUNTER — Telehealth: Payer: Self-pay

## 2022-12-03 ENCOUNTER — Other Ambulatory Visit (HOSPITAL_COMMUNITY): Payer: Self-pay

## 2022-12-03 NOTE — Telephone Encounter (Signed)
Pharmacy Patient Advocate Encounter   Received notification from CoverMyMeds that prior authorization for [facility] 0.25MG /0.5ML auto-injectors is required/requested.   Insurance verification completed.   The patient is insured through [employer] .   Please see below for a question from the insurance that requires assistance.  Is provider aware of this interaction? Please advise

## 2022-12-04 NOTE — Telephone Encounter (Signed)
Noted  

## 2022-12-12 DIAGNOSIS — I10 Essential (primary) hypertension: Secondary | ICD-10-CM | POA: Diagnosis not present

## 2022-12-12 DIAGNOSIS — C55 Malignant neoplasm of uterus, part unspecified: Secondary | ICD-10-CM | POA: Diagnosis not present

## 2022-12-16 ENCOUNTER — Other Ambulatory Visit (HOSPITAL_COMMUNITY): Payer: Self-pay

## 2022-12-21 DIAGNOSIS — G4733 Obstructive sleep apnea (adult) (pediatric): Secondary | ICD-10-CM | POA: Diagnosis not present

## 2022-12-24 DIAGNOSIS — M545 Low back pain, unspecified: Secondary | ICD-10-CM | POA: Diagnosis not present

## 2022-12-24 DIAGNOSIS — M9902 Segmental and somatic dysfunction of thoracic region: Secondary | ICD-10-CM | POA: Diagnosis not present

## 2022-12-24 DIAGNOSIS — M542 Cervicalgia: Secondary | ICD-10-CM | POA: Diagnosis not present

## 2022-12-24 DIAGNOSIS — M9901 Segmental and somatic dysfunction of cervical region: Secondary | ICD-10-CM | POA: Diagnosis not present

## 2022-12-24 DIAGNOSIS — M9903 Segmental and somatic dysfunction of lumbar region: Secondary | ICD-10-CM | POA: Diagnosis not present

## 2022-12-24 DIAGNOSIS — M546 Pain in thoracic spine: Secondary | ICD-10-CM | POA: Diagnosis not present

## 2022-12-25 ENCOUNTER — Encounter: Payer: Self-pay | Admitting: Adult Health

## 2022-12-25 ENCOUNTER — Other Ambulatory Visit (HOSPITAL_COMMUNITY): Payer: Self-pay

## 2022-12-25 NOTE — Telephone Encounter (Signed)
Please advise 

## 2022-12-25 NOTE — Telephone Encounter (Signed)
I would like to try the PA as those studies were only done in rodents

## 2022-12-25 NOTE — Telephone Encounter (Signed)
Can someone give the update on this to confirm the outcome?

## 2022-12-28 ENCOUNTER — Other Ambulatory Visit (HOSPITAL_COMMUNITY): Payer: Self-pay

## 2022-12-28 NOTE — Telephone Encounter (Addendum)
Per provider, proceed with PA as those studies were only done in rodents.

## 2022-12-28 NOTE — Telephone Encounter (Signed)
Pharmacy Patient Advocate Encounter   Received notification from Pt Calls Messages that prior authorization for Wegovy 0.25MG /0.5ML auto-injectors is required/requested.   Insurance verification completed.   The patient is insured through [employer] .   Per test claim: PA required; PA submitted to [facility] via CoverMyMeds Key/confirmation #/EOC [id number] Status is pending

## 2022-12-28 NOTE — Telephone Encounter (Signed)
Pharmacy Patient Advocate Encounter  Received notification from [facility] that Prior Authorization for [facility] 0.25MG /0.5ML auto-injectors has been APPROVED from 07/24/22 to 05/18/23. Ran test claim, Copay is $64. This test claim was processed through [facility]- copay amounts may vary at other pharmacies due to pharmacy/plan contracts, or as the patient moves through the different stages of their insurance plan.   PA #/Case ID/Reference #: [id number]

## 2022-12-30 DIAGNOSIS — M546 Pain in thoracic spine: Secondary | ICD-10-CM | POA: Diagnosis not present

## 2022-12-30 DIAGNOSIS — M9902 Segmental and somatic dysfunction of thoracic region: Secondary | ICD-10-CM | POA: Diagnosis not present

## 2022-12-30 DIAGNOSIS — M545 Low back pain, unspecified: Secondary | ICD-10-CM | POA: Diagnosis not present

## 2022-12-30 DIAGNOSIS — M542 Cervicalgia: Secondary | ICD-10-CM | POA: Diagnosis not present

## 2022-12-30 DIAGNOSIS — M9901 Segmental and somatic dysfunction of cervical region: Secondary | ICD-10-CM | POA: Diagnosis not present

## 2022-12-30 DIAGNOSIS — M9903 Segmental and somatic dysfunction of lumbar region: Secondary | ICD-10-CM | POA: Diagnosis not present

## 2022-12-30 DIAGNOSIS — M62830 Muscle spasm of back: Secondary | ICD-10-CM | POA: Diagnosis not present

## 2022-12-30 NOTE — Telephone Encounter (Signed)
Tried to call pt with update but no answer. Update sent to pt Mychart.

## 2022-12-31 ENCOUNTER — Encounter (INDEPENDENT_AMBULATORY_CARE_PROVIDER_SITE_OTHER): Payer: Self-pay

## 2023-01-06 DIAGNOSIS — M62830 Muscle spasm of back: Secondary | ICD-10-CM | POA: Diagnosis not present

## 2023-01-06 DIAGNOSIS — M542 Cervicalgia: Secondary | ICD-10-CM | POA: Diagnosis not present

## 2023-01-06 DIAGNOSIS — M9903 Segmental and somatic dysfunction of lumbar region: Secondary | ICD-10-CM | POA: Diagnosis not present

## 2023-01-06 DIAGNOSIS — M62838 Other muscle spasm: Secondary | ICD-10-CM | POA: Diagnosis not present

## 2023-01-06 DIAGNOSIS — M546 Pain in thoracic spine: Secondary | ICD-10-CM | POA: Diagnosis not present

## 2023-01-06 DIAGNOSIS — M9901 Segmental and somatic dysfunction of cervical region: Secondary | ICD-10-CM | POA: Diagnosis not present

## 2023-01-06 DIAGNOSIS — M545 Low back pain, unspecified: Secondary | ICD-10-CM | POA: Diagnosis not present

## 2023-01-06 DIAGNOSIS — M9902 Segmental and somatic dysfunction of thoracic region: Secondary | ICD-10-CM | POA: Diagnosis not present

## 2023-01-07 DIAGNOSIS — M9902 Segmental and somatic dysfunction of thoracic region: Secondary | ICD-10-CM | POA: Diagnosis not present

## 2023-01-07 DIAGNOSIS — M9903 Segmental and somatic dysfunction of lumbar region: Secondary | ICD-10-CM | POA: Diagnosis not present

## 2023-01-07 DIAGNOSIS — M62838 Other muscle spasm: Secondary | ICD-10-CM | POA: Diagnosis not present

## 2023-01-07 DIAGNOSIS — M546 Pain in thoracic spine: Secondary | ICD-10-CM | POA: Diagnosis not present

## 2023-01-07 DIAGNOSIS — M542 Cervicalgia: Secondary | ICD-10-CM | POA: Diagnosis not present

## 2023-01-07 DIAGNOSIS — M9901 Segmental and somatic dysfunction of cervical region: Secondary | ICD-10-CM | POA: Diagnosis not present

## 2023-01-07 DIAGNOSIS — M545 Low back pain, unspecified: Secondary | ICD-10-CM | POA: Diagnosis not present

## 2023-01-07 DIAGNOSIS — M62830 Muscle spasm of back: Secondary | ICD-10-CM | POA: Diagnosis not present

## 2023-01-08 DIAGNOSIS — R232 Flushing: Secondary | ICD-10-CM | POA: Diagnosis not present

## 2023-01-08 DIAGNOSIS — R000 Tachycardia, unspecified: Secondary | ICD-10-CM | POA: Diagnosis not present

## 2023-01-11 DIAGNOSIS — M9902 Segmental and somatic dysfunction of thoracic region: Secondary | ICD-10-CM | POA: Diagnosis not present

## 2023-01-11 DIAGNOSIS — M546 Pain in thoracic spine: Secondary | ICD-10-CM | POA: Diagnosis not present

## 2023-01-11 DIAGNOSIS — M9901 Segmental and somatic dysfunction of cervical region: Secondary | ICD-10-CM | POA: Diagnosis not present

## 2023-01-11 DIAGNOSIS — M9903 Segmental and somatic dysfunction of lumbar region: Secondary | ICD-10-CM | POA: Diagnosis not present

## 2023-01-11 DIAGNOSIS — M542 Cervicalgia: Secondary | ICD-10-CM | POA: Diagnosis not present

## 2023-01-11 DIAGNOSIS — M545 Low back pain, unspecified: Secondary | ICD-10-CM | POA: Diagnosis not present

## 2023-01-12 DIAGNOSIS — M9901 Segmental and somatic dysfunction of cervical region: Secondary | ICD-10-CM | POA: Diagnosis not present

## 2023-01-12 DIAGNOSIS — M542 Cervicalgia: Secondary | ICD-10-CM | POA: Diagnosis not present

## 2023-01-12 DIAGNOSIS — M9903 Segmental and somatic dysfunction of lumbar region: Secondary | ICD-10-CM | POA: Diagnosis not present

## 2023-01-12 DIAGNOSIS — M545 Low back pain, unspecified: Secondary | ICD-10-CM | POA: Diagnosis not present

## 2023-01-12 DIAGNOSIS — M9902 Segmental and somatic dysfunction of thoracic region: Secondary | ICD-10-CM | POA: Diagnosis not present

## 2023-01-12 DIAGNOSIS — M546 Pain in thoracic spine: Secondary | ICD-10-CM | POA: Diagnosis not present

## 2023-01-13 DIAGNOSIS — M545 Low back pain, unspecified: Secondary | ICD-10-CM | POA: Diagnosis not present

## 2023-01-13 DIAGNOSIS — M546 Pain in thoracic spine: Secondary | ICD-10-CM | POA: Diagnosis not present

## 2023-01-13 DIAGNOSIS — M9902 Segmental and somatic dysfunction of thoracic region: Secondary | ICD-10-CM | POA: Diagnosis not present

## 2023-01-13 DIAGNOSIS — M9903 Segmental and somatic dysfunction of lumbar region: Secondary | ICD-10-CM | POA: Diagnosis not present

## 2023-01-13 DIAGNOSIS — M9901 Segmental and somatic dysfunction of cervical region: Secondary | ICD-10-CM | POA: Diagnosis not present

## 2023-01-13 DIAGNOSIS — M542 Cervicalgia: Secondary | ICD-10-CM | POA: Diagnosis not present

## 2023-01-20 DIAGNOSIS — M546 Pain in thoracic spine: Secondary | ICD-10-CM | POA: Diagnosis not present

## 2023-01-20 DIAGNOSIS — M545 Low back pain, unspecified: Secondary | ICD-10-CM | POA: Diagnosis not present

## 2023-01-20 DIAGNOSIS — M9901 Segmental and somatic dysfunction of cervical region: Secondary | ICD-10-CM | POA: Diagnosis not present

## 2023-01-20 DIAGNOSIS — M542 Cervicalgia: Secondary | ICD-10-CM | POA: Diagnosis not present

## 2023-01-20 DIAGNOSIS — M9903 Segmental and somatic dysfunction of lumbar region: Secondary | ICD-10-CM | POA: Diagnosis not present

## 2023-01-20 DIAGNOSIS — M9902 Segmental and somatic dysfunction of thoracic region: Secondary | ICD-10-CM | POA: Diagnosis not present

## 2023-01-20 NOTE — Progress Notes (Signed)
Radiation Oncology         [phone]) [phone] ________________________________  Name: Kylie Davis MRN: [id number]  Date: 01/21/2023  DOB: February 15, 1951  Follow-Up Visit Note  CC: Shirline Frees, NP  Shirline Frees, NP  No diagnosis found.  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 She was last seen here for follow-up on 09/03/22.     Since her last visit, the patient followed up with Dr. Pricilla Holm on 10/23/22. During which time, the patient denied any symptoms concerning for disease recurrence and she was noted as NED on examination. She was also noted to report no change in her baseline urinary incontinence.     No other significant oncologic interval history since the patient was last seen for follow-up.   ***                           Allergies:  has No Known Allergies.  Meds: Current Outpatient Medications  Medication Sig Dispense Refill   acetaminophen (TYLENOL) 500 MG tablet Take 500-1,000 mg by mouth every 6 (six) hours as needed (pain.).     aspirin EC 81 MG tablet Take 81 mg by mouth in the morning.     Cholecalciferol (VITAMIN D3 PO) Take 1 tablet by mouth 2 (two) times a week.     Continuous Glucose Sensor (FREESTYLE LIBRE 3 SENSOR) MISC PLACE 1 SENSOR ON THE SKIN EVERY 14 DAYS TO CHECK GLUCOSE CONTINUOUSLY 2 each 6   ibuprofen (ADVIL) 600 MG tablet Take 1 tablet (600 mg total) by mouth every 6 (six) hours as needed for moderate pain. For AFTER surgery only 30 tablet 0   losartan-hydrochlorothiazide (HYZAAR) 50-12.5 MG tablet TAKE 1 TABLET BY MOUTH DAILY 90 tablet 1   rosuvastatin (CRESTOR) 10 MG tablet Take 1 tablet (10 mg total) by mouth  daily. 90 tablet 3   Semaglutide-Weight Management (WEGOVY) 0.25 MG/0.5ML SOAJ Inject 0.25 mg into the skin once a week. 2 mL 0   No current facility-administered medications for this encounter.    Physical Findings: The patient is in no acute distress. Patient is alert and oriented.  vitals were not taken for this visit. .  No significant changes. Lungs are clear to auscultation bilaterally. Heart has regular rate and rhythm. No palpable cervical, supraclavicular, or axillary adenopathy. Abdomen soft, non-tender, normal bowel sounds.  On pelvic examination the external genitalia were unremarkable. A speculum exam was performed. There are no mucosal lesions noted in the vaginal vault. A Pap smear was obtained of the proximal vagina. On bimanual and rectovaginal examination there were no pelvic masses appreciated. ***   Lab Findings: Lab Results  Component Value Date   WBC 6.1 04/01/2022   HGB 14.0 04/01/2022   HCT 42.1 04/01/2022   MCV 88.5 04/01/2022   PLT 280.0 04/01/2022    Radiographic Findings: No results found.  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  ***  Plan:  ***   *** minutes of total time was spent for this patient encounter, including preparation, face-to-face counseling with the patient and coordination of care, physical  exam, and documentation of the encounter. ____________________________________  Billie Lade, PhD, MD  This document serves as a record of services personally performed by Antony Blackbird, MD. It was created on his behalf by Neena Rhymes, a trained medical scribe. The creation of this record is based on the scribe's personal observations and the provider's statements to them. This document has been checked and approved by the attending provider.

## 2023-01-21 ENCOUNTER — Ambulatory Visit
Admission: RE | Admit: 2023-01-21 | Discharge: 2023-01-21 | Disposition: A | Discharge status: Home / Self Care | Payer: Medicare PPO | Source: Ambulatory Visit | Attending: Radiation Oncology | Admitting: Radiation Oncology

## 2023-01-21 ENCOUNTER — Encounter: Payer: Self-pay | Admitting: Radiation Oncology

## 2023-01-21 VITALS — BP 121/54 | HR 79 | Temp 97.9°F | Resp 20 | Ht 64.5 in | Wt 244.2 lb

## 2023-01-21 DIAGNOSIS — Z79899 Other long term (current) drug therapy: Secondary | ICD-10-CM | POA: Insufficient documentation

## 2023-01-21 DIAGNOSIS — M9902 Segmental and somatic dysfunction of thoracic region: Secondary | ICD-10-CM | POA: Diagnosis not present

## 2023-01-21 DIAGNOSIS — C541 Malignant neoplasm of endometrium: Secondary | ICD-10-CM

## 2023-01-21 DIAGNOSIS — Z8542 Personal history of malignant neoplasm of other parts of uterus: Secondary | ICD-10-CM | POA: Insufficient documentation

## 2023-01-21 DIAGNOSIS — R32 Unspecified urinary incontinence: Secondary | ICD-10-CM | POA: Diagnosis not present

## 2023-01-21 DIAGNOSIS — M545 Low back pain, unspecified: Secondary | ICD-10-CM | POA: Diagnosis not present

## 2023-01-21 DIAGNOSIS — M9903 Segmental and somatic dysfunction of lumbar region: Secondary | ICD-10-CM | POA: Diagnosis not present

## 2023-01-21 DIAGNOSIS — M546 Pain in thoracic spine: Secondary | ICD-10-CM | POA: Diagnosis not present

## 2023-01-21 DIAGNOSIS — Z923 Personal history of irradiation: Secondary | ICD-10-CM | POA: Diagnosis not present

## 2023-01-21 DIAGNOSIS — M9901 Segmental and somatic dysfunction of cervical region: Secondary | ICD-10-CM | POA: Diagnosis not present

## 2023-01-21 DIAGNOSIS — M542 Cervicalgia: Secondary | ICD-10-CM | POA: Diagnosis not present

## 2023-01-21 NOTE — Progress Notes (Signed)
Kylie Davis is here today for follow up post radiation to the pelvic.  They completed their radiation on: 01/14/22   Does the patient complain of any of the following:  Pain:No Abdominal bloating: No Diarrhea/Constipation: No Nausea/Vomiting: No Vaginal Discharge: No Blood in Urine or Stool: No Urinary Issues (dysuria/incomplete emptying/ incontinence/ increased frequency/urgency): No Does patient report using vaginal dilator 2-3 times a week and/or sexually active 2-3 weeks: Yes Post radiation skin changes: No   Additional comments if applicable:She wanted to know to know when her CT scan will be scheduled? She also wants to know if she can take her Wegovvy 0.25 mg.  BP (!) 121/54 (BP Location: Right Arm, Patient Position: Sitting, Cuff Size: Large)   Pulse 79   Temp 97.9 F (36.6 C)   Resp 20   Ht 5' 4.5" (1.638 m)   Wt 244 lb 3.2 oz (110.8 kg)   SpO2 99%   BMI 41.27 kg/m

## 2023-01-22 ENCOUNTER — Ambulatory Visit (INDEPENDENT_AMBULATORY_CARE_PROVIDER_SITE_OTHER): Payer: Medicare PPO

## 2023-01-22 VITALS — BP 122/62 | HR 77 | Temp 98.3°F | Ht 64.0 in | Wt 245.3 lb

## 2023-01-22 DIAGNOSIS — Z0000 Encounter for general adult medical examination without abnormal findings: Secondary | ICD-10-CM | POA: Diagnosis not present

## 2023-01-22 NOTE — Patient Instructions (Addendum)
Ms. Moskal , Thank you for taking time to come for your Medicare Wellness Visit. I appreciate your ongoing commitment to your health goals. Please review the following plan we discussed and let me know if I can assist you in the future.   Referrals/Orders/Follow-Ups/Clinician Recommendations:   This is a list of the screening recommended for you and due dates:  Health Maintenance  Topic Date Due   Eye exam for diabetics  Never done   COVID-19 Vaccine (3 - Moderna risk series) 06/07/2020   Hemoglobin A1C  09/30/2022   Flu Shot  12/17/2022   Colon Cancer Screening  02/25/2023   Yearly kidney function blood test for diabetes  04/02/2023   Yearly kidney health urinalysis for diabetes  04/02/2023   Mammogram  05/15/2023   DTaP/Tdap/Td vaccine (3 - Td or Tdap) 09/14/2023   Medicare Annual Wellness Visit  01/22/2024   Pneumonia Vaccine  Completed   DEXA scan (bone density measurement)  Completed   Hepatitis C Screening  Completed   Zoster (Shingles) Vaccine  Completed   HPV Vaccine  Aged Out   Complete foot exam   Discontinued    Advanced directives: (Copy Requested) Please bring a copy of your health care power of attorney and living will to the office to be added to your chart at your convenience.  Next Medicare Annual Wellness Visit scheduled for next year: Yes

## 2023-01-22 NOTE — Progress Notes (Signed)
Subjective:   Kylie Davis is a 72 y.o. female who presents for Medicare Annual (Subsequent) preventive examination.  Visit Complete: In person    Review of Systems    Cardiac Risk Factors include: advanced age (>27men, >51 women);hypertension     Objective:    Today's Vitals   01/22/23 0849 01/22/23 0900  BP: 122/62   Pulse: 77   Temp: 98.3 F (36.8 C)   TempSrc: Oral   SpO2: 98%   Weight: 245 lb 4.8 oz (111.3 kg)   Height: 5\' 4"  (1.626 m)   PainSc:  0-No pain   Body mass index is 42.11 kg/m.     01/22/2023    9:06 AM 01/21/2023   10:21 AM 10/22/2022   10:06 AM 09/03/2022   10:02 AM 02/19/2022    8:40 AM 01/13/2022    9:06 AM 12/16/2021    9:07 AM  Advanced Directives  Does Patient Have a Medical Advance Directive? Yes Yes Yes Yes Yes Yes Yes  Type of Estate agent of [place];Living will  Healthcare Power of [place];Living will   Healthcare Power of [place];Living will Healthcare Power of [place];Living will  Does patient want to make changes to medical advance directive?     No - Patient declined    Copy of Healthcare Power of Attorney in Chart? No - copy requested     No - copy requested   Would patient like information on creating a medical advance directive?    No - Patient declined       Current Medications (verified) Outpatient Encounter Medications as of 01/22/2023  Medication Sig   acetaminophen (TYLENOL) 500 MG tablet Take 500-1,000 mg by mouth every 6 (six) hours as needed (pain.).   aspirin EC 81 MG tablet Take 81 mg by mouth in the morning.   Cholecalciferol (VITAMIN D3 PO) Take 1 tablet by mouth 2 (two) times a week.   Continuous Glucose Sensor (FREESTYLE LIBRE 3 SENSOR) MISC PLACE 1 SENSOR ON THE SKIN EVERY 14 DAYS TO CHECK GLUCOSE CONTINUOUSLY   ibuprofen (ADVIL) 600 MG tablet Take 1 tablet (600 mg total) by mouth every 6 (six) hours as needed for moderate pain. For AFTER surgery only (Patient not taking: Reported on 01/21/2023)    losartan-hydrochlorothiazide (HYZAAR) 50-12.5 MG tablet TAKE 1 TABLET BY MOUTH DAILY   rosuvastatin (CRESTOR) 10 MG tablet Take 1 tablet (10 mg total) by mouth daily. (Patient not taking: Reported on 01/21/2023)   Semaglutide-Weight Management (WEGOVY) 0.25 MG/0.5ML SOAJ Inject 0.25 mg into the skin once a week.   No facility-administered encounter medications on file as of 01/22/2023.    Allergies (verified) Patient has no known allergies.   History: Past Medical History:  Diagnosis Date   Arthritis    BMI 39.0-39.9,adult    Diabetes (HCC)    on Trulicity   endometrial ca 09/2021   Family history of pancreatic cancer    Family history of peritoneal cancer    History of radiation therapy    Endometrium- HDR 12/16/21-01/15/20- Dr. Antony Blackbird   Hypertension    PONV (postoperative nausea and vomiting)    Sleep apnea    C PAP   Past Surgical History:  Procedure Laterality Date   CHOLECYSTECTOMY  2004   LYMPH NODE BIOPSY N/A 10/22/2021   Procedure: Sentinel LYMPH NODE BIOPSY;  Surgeon: Carver Fila, MD;  Location: [facility];  Service: Gynecology;  Laterality: N/A;   ROBOTIC ASSISTED TOTAL HYSTERECTOMY WITH BILATERAL SALPINGO OOPHERECTOMY Bilateral 10/22/2021  Procedure: XI ROBOTIC ASSISTED TOTAL HYSTERECTOMY WITH BILATERAL SALPINGO OOPHORECTOMY;  Surgeon: Carver Fila, MD;  Location: [facility];  Service: Gynecology;  Laterality: Bilateral;   Family History  Problem Relation Age of Onset   Cancer Mother 10       primary peritoneal cancer   Alzheimer's disease Mother    Heart disease Father    Pancreatic cancer Father 2   Hypertension Brother    Heart attack Brother 53       MI x 2   Breast cancer Paternal Aunt 47   Diabetes Paternal Grandmother    Colon cancer Neg Hx    Prostate cancer Neg Hx    Ovarian cancer Neg Hx    Social History   Socioeconomic History   Marital status: Married    Spouse name: Not on file   Number of children: Not on file   Years of  education: Not on file   Highest education level: Not on file  Occupational History   Occupation: retired Magazine features editor: RETIRED  Tobacco Use   Smoking status: Never   Smokeless tobacco: Never  Vaping Use   Vaping status: Never Used  Substance and Sexual Activity   Alcohol use: Yes    Alcohol/week: 4.0 standard drinks of alcohol    Types: 4 Standard drinks or equivalent per week    Comment: occas   Drug use: No   Sexual activity: Not Currently    Birth control/protection: Post-menopausal    Comment: 1st intercourse 72 yo.---Fewer than 5 partners  Other Topics Concern   Not on file  Social History Narrative   Retired from being a Engineer, site.    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 years    0 children    Social Determinants of Health   Financial Resource Strain: Low Risk  (01/22/2023)   Overall Financial Resource Strain (CARDIA)    Difficulty of Paying Living Expenses: Not hard at all  Food Insecurity: No Food Insecurity (01/22/2023)   Hunger Vital Sign    Worried About Running Out of Food in the Last Year: Never true    Ran Out of Food in the Last Year: Never true  Transportation Needs: No Transportation Needs (01/22/2023)   PRAPARE - Administrator, Civil Service (Medical): No    Lack of Transportation (Non-Medical): No  Physical Activity: Insufficiently Active (01/22/2023)   Exercise Vital Sign    Days of Exercise per Week: 3 days    Minutes of Exercise per Session: 30 min  Stress: No Stress Concern Present (01/22/2023)   [employer] of Occupational Health - Occupational Stress Questionnaire    Feeling of Stress : Not at all  Social Connections: Socially Integrated (01/22/2023)   Social Connection and Isolation Panel [NHANES]    Frequency of Communication with Friends and Family: More than three times a week    Frequency of Social Gatherings with Friends and Family: More than three times a week    Attends Religious Services: More than 4 times per year    Active Member of  [employer] or Organizations: Yes    Attends Engineer, structural: More than 4 times per year    Marital Status: Married    Tobacco Counseling Counseling given: Not Answered   Clinical Intake:  Pre-visit preparation completed: Yes  Pain : No/denies pain Pain Score: 0-No pain     BMI - recorded: 42.11 Nutritional Status: BMI > 30  Obese Nutritional Risks: None Diabetes: No  How  often do you need to have someone help you when you read instructions, pamphlets, or other written materials from your doctor or pharmacy?: 1 - Never  Interpreter Needed?: No  Information entered by :: Theresa Mulligan LPN   Activities of Daily Living    01/22/2023    9:04 AM  In your present state of health, do you have any difficulty performing the following activities:  Hearing? 0  Vision? 0  Difficulty concentrating or making decisions? 0  Walking or climbing stairs? 0  Dressing or bathing? 0  Doing errands, shopping? 0  Preparing Food and eating ? N  Using the Toilet? N  In the past six months, have you accidently leaked urine? Y  Comment Wears pads. Followed by Medical attention  Do you have problems with loss of bowel control? N  Managing your Medications? N  Managing your Finances? N  Housekeeping or managing your Housekeeping? N    Patient Care Team: Shirline Frees, NP as PCP - General (Family Medicine)  Indicate any recent Medical Services you may have received from other than [facility] providers in the past year (date may be approximate).     Assessment:   This is a routine wellness examination for Kylie Davis.  Hearing/Vision screen Hearing Screening - Comments:: Denies hearing difficulties   Vision Screening - Comments:: Wears rx glasses - up to date with routine eye exams with  Dr Lorin Picket   Goals Addressed               This Visit's Progress     Increase physical activity (pt-stated)        Lose weight.       Depression Screen    01/22/2023    8:49 AM 11/26/2022     9:36 AM 01/13/2022    9:08 AM 04/08/2021    1:54 PM 12/18/2020    2:10 PM 12/25/2019   10:41 AM 03/08/2018    8:05 AM  PHQ 2/9 Scores  PHQ - 2 Score 0 0 0 0 0 0 0  PHQ- 9 Score 0 0  2  0     Fall Risk    01/22/2023    9:05 AM 01/13/2022    9:07 AM 04/08/2021    1:54 PM 12/18/2020    2:10 PM 12/25/2019   10:39 AM  Fall Risk   Falls in the past year? 0 0 0 0 0  Number falls in past yr: 0 0 0 0 0  Injury with Fall? 0 0 0 0 0  Risk for fall due to : No Fall Risks Medication side effect   Medication side effect;Orthopedic patient  Follow up Falls prevention discussed Falls evaluation completed;Education provided;Falls prevention discussed  Falls evaluation completed Falls evaluation completed;Falls prevention discussed    MEDICARE RISK AT HOME: Medicare Risk at Home Any stairs in or around the home?: No If so, are there any without handrails?: No Home free of loose throw rugs in walkways, pet beds, electrical cords, etc?: Yes Adequate lighting in your home to reduce risk of falls?: Yes Life alert?: No Use of a cane, walker or w/c?: No Grab bars in the bathroom?: Yes Shower chair or bench in shower?: No Elevated toilet seat or a handicapped toilet?: No  TIMED UP AND GO:  Was the test performed?  Yes  Length of time to ambulate 10 feet: 10 sec Gait steady and fast without use of assistive device    Cognitive Function:        01/22/2023  9:08 AM 01/13/2022    9:09 AM  6CIT Screen  What Year? 0 points 0 points  What month? 0 points 0 points  What time? 0 points 0 points  Count back from 20 0 points 0 points  Months in reverse 0 points 2 points  Repeat phrase 0 points 4 points  Total Score 0 points 6 points    Immunizations Immunization History  Administered Date(s) Administered   Influenza Split 02/25/2012, 03/18/2013, 02/02/2014   Influenza, High Dose Seasonal PF 02/28/2015, 02/20/2016, 03/03/2017, 03/08/2018, 03/17/2019   Moderna SARS-COV2 Booster Vaccination 05/10/2020    Moderna Sars-Covid-2 Vaccination 07/22/2019, 08/22/2019   Pneumococcal Conjugate-13 02/28/2015   Pneumococcal Polysaccharide-23 02/20/2016   Td 05/19/2003   Tdap 09/13/2013   Zoster Recombinant(Shingrix) 06/25/2018, 11/06/2018   Zoster, Live 12/15/2010    TDAP status: Up to date  Flu Vaccine status: Due, Education has been provided regarding the importance of this vaccine. Advised may receive this vaccine at local pharmacy or Health Dept. Aware to provide a copy of the vaccination record if obtained from local pharmacy or Health Dept. Verbalized acceptance and understanding.  Pneumococcal vaccine status: Up to date  Covid-19 vaccine status: Declined, Education has been provided regarding the importance of this vaccine but patient still declined. Advised may receive this vaccine at local pharmacy or Health Dept.or vaccine clinic. Aware to provide a copy of the vaccination record if obtained from local pharmacy or Health Dept. Verbalized acceptance and understanding.  Qualifies for Shingles Vaccine? Yes   Zostavax completed Yes   Shingrix Completed?: Yes  Screening Tests Health Maintenance  Topic Date Due   OPHTHALMOLOGY EXAM  Never done   COVID-19 Vaccine (3 - Moderna risk series) 06/07/2020   HEMOGLOBIN A1C  09/30/2022   INFLUENZA VACCINE  12/17/2022   Colonoscopy  02/25/2023   Diabetic kidney evaluation - eGFR measurement  04/02/2023   Diabetic kidney evaluation - Urine ACR  04/02/2023   MAMMOGRAM  05/15/2023   DTaP/Tdap/Td (3 - Td or Tdap) 09/14/2023   Medicare Annual Wellness (AWV)  01/22/2024   Pneumonia Vaccine 44+ Years old  Completed   DEXA SCAN  Completed   Hepatitis C Screening  Completed   Zoster Vaccines- Shingrix  Completed   HPV VACCINES  Aged Out   FOOT EXAM  Discontinued    Health Maintenance  Health Maintenance Due  Topic Date Due   OPHTHALMOLOGY EXAM  Never done   COVID-19 Vaccine (3 - Moderna risk series) 06/07/2020   HEMOGLOBIN A1C  09/30/2022    INFLUENZA VACCINE  12/17/2022   Colonoscopy  02/25/2023    Colorectal cancer screening: Type of screening: Colonoscopy. Completed 02/24/13. Repeat every 10 years  Mammogram status: Completed 05/14/22. Repeat every year  Bone Density status: Completed 04/21/16. Results reflect: Bone density results: NORMAL. Repeat every   years.  Lung Cancer Screening: (Low Dose CT Chest recommended if Age 110-80 years, 20 pack-year currently smoking OR have quit w/in 15years.) does not qualify.     Additional Screening:  Hepatitis C Screening: does qualify; Completed 03/03/17  Vision Screening: Recommended annual ophthalmology exams for early detection of glaucoma and other disorders of the eye. Is the patient up to date with their annual eye exam?  Yes  Who is the provider or what is the name of the office in which the patient attends annual eye exams? Dr Lorin Picket If pt is not established with a provider, would they like to be referred to a provider to establish care? No .  Dental Screening: Recommended annual dental exams for proper oral hygiene    Community Resource Referral / Chronic Care Management:  CRR required this visit?  No   CCM required this visit?  No     Plan:     I have personally reviewed and noted the following in the patient's chart:   Medical and social history Use of alcohol, tobacco or illicit drugs  Current medications and supplements including opioid prescriptions. Patient is not currently taking opioid prescriptions. Functional ability and status Nutritional status Physical activity Advanced directives List of other physicians Hospitalizations, surgeries, and ER visits in previous 12 months Vitals Screenings to include cognitive, depression, and falls Referrals and appointments  In addition, I have reviewed and discussed with patient certain preventive protocols, quality metrics, and best practice recommendations. A written personalized care plan for preventive  services as well as general preventive health recommendations were provided to patient.     Tillie Rung, LPN   06/22/3662   After Visit Summary: Given  Nurse Notes: None

## 2023-01-25 ENCOUNTER — Ambulatory Visit: Payer: Medicare PPO | Admitting: Family Medicine

## 2023-01-25 ENCOUNTER — Encounter: Payer: Self-pay | Admitting: Family Medicine

## 2023-01-25 VITALS — BP 126/78 | HR 70 | Temp 98.6°F | Wt 242.0 lb

## 2023-01-25 DIAGNOSIS — R202 Paresthesia of skin: Secondary | ICD-10-CM | POA: Diagnosis not present

## 2023-01-25 DIAGNOSIS — R200 Anesthesia of skin: Secondary | ICD-10-CM | POA: Diagnosis not present

## 2023-01-25 DIAGNOSIS — Z1211 Encounter for screening for malignant neoplasm of colon: Secondary | ICD-10-CM

## 2023-01-25 NOTE — Addendum Note (Signed)
Addended by: Gershon Crane A on: 01/25/2023 03:37 PM   Modules accepted: Orders

## 2023-01-25 NOTE — Progress Notes (Signed)
   Subjective:    Patient ID: Kylie Davis, female    DOB: 30-Aug-1950, 72 y.o.   MRN: [id number]  HPI Here for 2 weeks of intermittent tingling in the skin that starts under the right armpit and radiates around to the right breast. No pain or lumps. No rashes. She had a normal mammogram last December. Of note, she has been seeing a chiropractor for neck pain, and he has been doing manipulations on her.    Review of Systems  Constitutional: Negative.   Respiratory: Negative.    Cardiovascular: Negative.   Musculoskeletal:  Positive for back pain and neck pain.       Objective:   Physical Exam Constitutional:      General: She is not in acute distress.    Appearance: Normal appearance.  Cardiovascular:     Rate and Rhythm: Normal rate and regular rhythm.     Pulses: Normal pulses.     Heart sounds: Normal heart sounds.  Pulmonary:     Effort: Pulmonary effort is normal.     Breath sounds: Normal breath sounds.     Comments: The right breast and axilla are normal on exam. No lumps or tenderness  Musculoskeletal:     Comments: She is very tender along the right side of the thoracic spine at around the T 9-10 area and under the right scapula   Skin:    Findings: No erythema or rash.  Neurological:     Mental Status: She is alert.           Assessment & Plan:  Tingling in the right axilla and breast. This is consistent with radicular symptoms from a thoracic nerve root. She will see her chiropractor tomorrow, and she will tell him about this. If it does not improve, she will see [place] for further evaluation.  Gershon Crane, MD

## 2023-02-15 ENCOUNTER — Other Ambulatory Visit: Payer: Self-pay | Admitting: Adult Health

## 2023-02-15 DIAGNOSIS — E66812 Obesity, class 2: Secondary | ICD-10-CM

## 2023-02-15 DIAGNOSIS — R7303 Prediabetes: Secondary | ICD-10-CM

## 2023-02-18 DIAGNOSIS — S0990XA Unspecified injury of head, initial encounter: Secondary | ICD-10-CM | POA: Diagnosis not present

## 2023-02-21 NOTE — Progress Notes (Signed)
@Patient  ID: Kylie Davis, female    DOB: April 15, 1951,    MRN: [id number]  Referring provider: Shirline Frees, NP  HPI: female  smoker followed for obstructive sleep apnea Retired Chartered loss adjuster  TEST/EVENTS :  HST 10/2013:  AHI 25/hr.  03/11/2022 OV- Parrett, NP-Follow up : OSA patient returns for 1 year follow up.  Patient has sleep apnea is on nocturnal CPAP.  Patient says she is doing well on CPAP.  She wears her CPAP every single night.  Feels that she benefits with CPAP with decreased daytime sleepiness.  CPAP download shows excellent compliance with daily average usage at 7 hours.  Patient is on auto CPAP 5 to 20 cm H2O.  Daily average pressure at 12.3 cm H2O.  AHI is 2.7/hour.  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  She underwent total hysterectomy and radiation therapy.  Surveillance CT chest February 25, 2022 showed no evidence of recurrent disease.  There are following small nodules in the anterior upper abdomen that appear stable.  She has a upcoming surveillance CT in 6 months.  Says she is doing well.  She is trying to become more active. Continues to travel to the [place] and [place] often.  Usually spends the winters in [place].  02/23/23- 72 yoF never smoker followed for OSA, complicated by HTN, Allergic Rhinitis, DM, Endometrial Cancer, Obesity, CPAP auto 5-20    AirSense 10  Download compliance-97%, AHI 2.5/hr Body weight today-239 lbs We discussed CPAP comfort and options. She could be referred for oral appliance if she wants.  ROS-see HPI   + = positive Constitutional:    weight loss, night sweats, fevers, chills, fatigue, lassitude. HEENT:    headaches, difficulty swallowing, tooth/dental problems, sore throat,       sneezing, itching, ear ache, nasal congestion, post nasal drip, snoring CV:    chest pain, orthopnea, PND, swelling in lower extremities, anasarca,                                  dizziness,  palpitations Resp:   shortness of breath with exertion or at rest.                productive cough,   non-productive cough, coughing up of blood.              change in color of mucus.  wheezing.   Skin:    rash or lesions. GI:  No-   heartburn, indigestion, abdominal pain, nausea, vomiting, diarrhea,                 change in bowel habits, loss of appetite GU: dysuria, change in color of urine, no urgency or frequency.   flank pain. MS:   joint pain, stiffness, decreased range of motion, back pain. Neuro-     nothing unusual Psych:  change in mood or affect.  depression or anxiety.   memory loss.  OBJ- Physical Exam +obese General- Alert, Oriented, Affect-appropriate, Distress- none acute Skin- rash-none, lesions- none, excoriation- none Lymphadenopathy- none Head- atraumatic            Eyes- Gross vision intact, PERRLA, conjunctivae and secretions clear            Ears- Hearing, canals-normal            Nose- Clear, no-Septal dev, mucus, polyps, erosion, perforation  Throat- Mallampati II , mucosa clear , drainage- none, tonsils- atrophic Neck- flexible , trachea midline, no stridor , thyroid nl, carotid no bruit Chest - symmetrical excursion , unlabored           Heart/CV- RRR , no murmur , no gallop  , no rub, nl s1 s2                           - JVD- none , edema- none, stasis changes- none, varices- none           Lung- clear to P&A, wheeze- none, cough- none , dullness-none, rub- none           Chest wall-  Abd-  Br/ Gen/ Rectal- Not done, not indicated Extrem- cyanosis- none, clubbing, none, atrophy- none, strength- nl Neuro- grossly intact to observation

## 2023-02-23 ENCOUNTER — Ambulatory Visit: Payer: Medicare PPO | Admitting: Internal Medicine

## 2023-02-23 ENCOUNTER — Encounter: Payer: Self-pay | Admitting: Internal Medicine

## 2023-02-23 VITALS — BP 127/76 | HR 66 | Ht 64.0 in | Wt 239.0 lb

## 2023-02-23 DIAGNOSIS — E66812 Obesity, class 2: Secondary | ICD-10-CM

## 2023-02-23 DIAGNOSIS — G4733 Obstructive sleep apnea (adult) (pediatric): Secondary | ICD-10-CM

## 2023-02-23 NOTE — Patient Instructions (Signed)
We can continue CPAP auto 5-20  We talked about alternatives to CPAP. I can refer you to learn more about a fitted oral appliance if you decide you would like to.

## 2023-03-03 ENCOUNTER — Other Ambulatory Visit (HOSPITAL_BASED_OUTPATIENT_CLINIC_OR_DEPARTMENT_OTHER): Payer: Self-pay

## 2023-03-03 ENCOUNTER — Encounter: Payer: Self-pay | Admitting: Adult Health

## 2023-03-03 DIAGNOSIS — R7303 Prediabetes: Secondary | ICD-10-CM

## 2023-03-03 DIAGNOSIS — E66812 Obesity, class 2: Secondary | ICD-10-CM

## 2023-03-03 MED ORDER — WEGOVY 0.25 MG/0.5ML ~~LOC~~ SOAJ
0.5000 mg | SUBCUTANEOUS | 0 refills | Status: DC
  Filled 2023-03-03: qty 2, 28d supply, fill #0

## 2023-03-03 NOTE — Telephone Encounter (Signed)
Please advise 

## 2023-03-04 ENCOUNTER — Other Ambulatory Visit (HOSPITAL_BASED_OUTPATIENT_CLINIC_OR_DEPARTMENT_OTHER): Payer: Self-pay

## 2023-03-04 ENCOUNTER — Telehealth: Payer: Self-pay

## 2023-03-04 NOTE — Telephone Encounter (Signed)
Pt aware of needing to change appointment time on 04/30/23. New time on 04/30/23 is 11:15. Pt agreed to new time.

## 2023-03-14 ENCOUNTER — Encounter: Payer: Self-pay | Admitting: Internal Medicine

## 2023-03-14 NOTE — Assessment & Plan Note (Signed)
Currently dealing with endometrial cancer, but in longer run, effort at weight loss should be encouraged

## 2023-03-14 NOTE — Assessment & Plan Note (Signed)
Benefits from CPAP and compliant, but may decide she wants to explore oral appliance in future Plan- for now, continue CPAP auto 5-20

## 2023-03-18 ENCOUNTER — Other Ambulatory Visit: Payer: Self-pay | Admitting: Adult Health

## 2023-03-18 DIAGNOSIS — Z1231 Encounter for screening mammogram for malignant neoplasm of breast: Secondary | ICD-10-CM

## 2023-03-24 DIAGNOSIS — L814 Other melanin hyperpigmentation: Secondary | ICD-10-CM | POA: Diagnosis not present

## 2023-03-24 DIAGNOSIS — L853 Xerosis cutis: Secondary | ICD-10-CM | POA: Diagnosis not present

## 2023-03-24 DIAGNOSIS — Z872 Personal history of diseases of the skin and subcutaneous tissue: Secondary | ICD-10-CM | POA: Diagnosis not present

## 2023-03-24 DIAGNOSIS — D225 Melanocytic nevi of trunk: Secondary | ICD-10-CM | POA: Diagnosis not present

## 2023-03-24 DIAGNOSIS — L821 Other seborrheic keratosis: Secondary | ICD-10-CM | POA: Diagnosis not present

## 2023-03-24 DIAGNOSIS — Z09 Encounter for follow-up examination after completed treatment for conditions other than malignant neoplasm: Secondary | ICD-10-CM | POA: Diagnosis not present

## 2023-03-30 ENCOUNTER — Ambulatory Visit (AMBULATORY_SURGERY_CENTER): Payer: Medicare PPO

## 2023-03-30 VITALS — Ht 64.0 in | Wt 237.0 lb

## 2023-03-30 DIAGNOSIS — Z1211 Encounter for screening for malignant neoplasm of colon: Secondary | ICD-10-CM

## 2023-03-30 MED ORDER — NA SULFATE-K SULFATE-MG SULF 17.5-3.13-1.6 GM/177ML PO SOLN: 1.0000 | Freq: Once | ORAL | 0 refills | Status: AC

## 2023-03-30 NOTE — Progress Notes (Signed)
Pre visit completed in person; Patient verified name, DOB, and address; No egg or soy allergy known to patient;  No issues known to pt with past sedation with any surgeries or procedures; Patient denies ever being told they had issues or difficulty with intubation;  No FH of Malignant Hyperthermia; Pt is not on diet pills; Pt is not on home 02;  Pt is not on blood thinners;  Pt denies issues with constipation;  No A fib or A flutter; Have any cardiac testing pending--NO Insurance verified during PV appt--- [employer] Medicare Pt can ambulate without assistance;  Pt denies use of chewing tobacco; Discussed diabetic/weight loss medication holds; Discussed NSAID holds; Checked BMI to be less than 50; Pt instructed to use Single[employer] or GoodRx for a price reduction on prep;  Patient's chart reviewed by Cathlyn Parsons CNRA prior to previsit and patient appropriate for the [facility];  Pre visit completed and red dot placed by patient's name on their procedure day (on provider's schedule);   Instructions sent to MyChart as well as printed and given to patient at time of PV appt;

## 2023-03-31 ENCOUNTER — Encounter: Payer: Self-pay | Admitting: Adult Health

## 2023-03-31 ENCOUNTER — Ambulatory Visit: Payer: Medicare PPO | Admitting: Adult Health

## 2023-03-31 ENCOUNTER — Other Ambulatory Visit (HOSPITAL_BASED_OUTPATIENT_CLINIC_OR_DEPARTMENT_OTHER): Payer: Self-pay

## 2023-03-31 VITALS — BP 120/60 | HR 72 | Temp 98.7°F | Wt 237.0 lb

## 2023-03-31 DIAGNOSIS — M25551 Pain in right hip: Secondary | ICD-10-CM | POA: Diagnosis not present

## 2023-03-31 DIAGNOSIS — E66812 Obesity, class 2: Secondary | ICD-10-CM

## 2023-03-31 DIAGNOSIS — G8929 Other chronic pain: Secondary | ICD-10-CM

## 2023-03-31 DIAGNOSIS — R7303 Prediabetes: Secondary | ICD-10-CM

## 2023-03-31 LAB — POCT GLYCOSYLATED HEMOGLOBIN (HGB A1C): Hemoglobin A1C: 5.9 % — AB (ref 4.0–5.6)

## 2023-03-31 MED ORDER — WEGOVY 0.5 MG/0.5ML ~~LOC~~ SOAJ
0.5000 mg | SUBCUTANEOUS | 0 refills | Status: DC
Start: 2023-03-31 — End: 2023-05-05
  Filled 2023-03-31: qty 2, 28d supply, fill #0

## 2023-03-31 NOTE — Progress Notes (Signed)
Subjective:    Patient ID: Kylie Davis, female    DOB: [birth year], 72 y.o.   MRN: [id number]  HPI 72 year old female who is being evaluated today for follow-up regarding prediabetes and obesity.  He was started on Wegovy 0.25 mg back in July 2024.  We recently increased her dose to 0.5 mg weekly about a month ago but she received the 0.25 mg dose.  She has been tolerating this medication well with no side effects. She has been exercising and eating healthier..   Lab Results  Component Value Date   HGBA1C 6.4 04/01/2022   Wt Readings from Last 10 Encounters:  03/31/23 237 lb (107.5 kg)  03/30/23 237 lb (107.5 kg)  02/23/23 239 lb (108.4 kg)  01/25/23 242 lb (109.8 kg)  01/22/23 245 lb 4.8 oz (111.3 kg)  01/21/23 244 lb 3.2 oz (110.8 kg)  11/26/22 242 lb (109.8 kg)  10/23/22 242 lb 3.2 oz (109.9 kg)  09/03/22 244 lb 3.2 oz (110.8 kg)  04/21/22 242 lb 2 oz (109.8 kg)   Additionally, she reports that she has been experiencing pain in her right groin for quite some time.  This pain is not present every day but she will notice it when she walks.  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  Review of Systems See HPI   Past Medical History:  Diagnosis Date   Arthritis    BMI 39.0-39.9,adult    Diabetes (HCC)    on Wegovy   Endometrial cancer (HCC) 09/2021   sx and radiation   Family history of pancreatic cancer    Family history of peritoneal cancer    History of radiation therapy    Endometrium- HDR 12/16/21-01/15/20- Dr. Antony Blackbird   Hypertension    PONV (postoperative nausea and vomiting)    Sleep apnea    C PAP    Social History   Socioeconomic History   Marital status: Married    Spouse name: Not on file   Number of children: Not on file   Years of education: Not on file   Highest education level: Not on file  Occupational History   Occupation: retired Magazine features editor: RETIRED  Tobacco Use   Smoking status:  Never   Smokeless tobacco: Never  Vaping Use   Vaping status: Never Used  Substance and Sexual Activity   Alcohol use: Yes    Alcohol/week: 5.0 standard drinks of alcohol    Types: 5 Standard drinks or equivalent per week    Comment: occas   Drug use: No   Sexual activity: Not Currently    Birth control/protection: Post-menopausal    Comment: 1st intercourse 72 yo.---Fewer than 5 partners  Other Topics Concern   Not on file  Social History Narrative   Retired from being a Engineer, site.    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 years    0 children    Social Determinants of Health   Financial Resource Strain: Low Risk  (01/22/2023)   Overall Financial Resource Strain (CARDIA)    Difficulty of Paying Living Expenses: Not hard at all  Food Insecurity: No Food Insecurity (01/22/2023)   Hunger Vital Sign    Worried About Running Out of Food in the Last Year: Never true    Ran Out of Food in the Last Year: Never true  Transportation Needs: No Transportation Needs (01/22/2023)   PRAPARE - Administrator, Civil Service (Medical): No  Lack of Transportation (Non-Medical): No  Physical Activity: Insufficiently Active (01/22/2023)   Exercise Vital Sign    Days of Exercise per Week: 3 days    Minutes of Exercise per Session: 30 min  Stress: No Stress Concern Present (01/22/2023)   [employer] of Occupational Health - Occupational Stress Questionnaire    Feeling of Stress : Not at all  Social Connections: Socially Integrated (01/22/2023)   Social Connection and Isolation Panel [NHANES]    Frequency of Communication with Friends and Family: More than three times a week    Frequency of Social Gatherings with Friends and Family: More than three times a week    Attends Religious Services: More than 4 times per year    Active Member of Clubs or Organizations: Yes    Attends Banker Meetings: More than 4 times per year    Marital Status: Married  Catering manager Violence: Not At Risk  (01/22/2023)   Humiliation, Afraid, Rape, and Kick questionnaire    Fear of Current or Ex-Partner: No    Emotionally Abused: No    Physically Abused: No    Sexually Abused: No    Past Surgical History:  Procedure Laterality Date   CHOLECYSTECTOMY  05/18/2002   COLONOSCOPY  02/24/2013   DB-MAC-moviprep(exc)-normal-10 yr recall   LYMPH NODE BIOPSY N/A 10/22/2021   Procedure: Sentinel LYMPH NODE BIOPSY;  Surgeon: Carver Fila, MD;  Location: [facility];  Service: Gynecology;  Laterality: N/A;   ROBOTIC ASSISTED TOTAL HYSTERECTOMY WITH BILATERAL SALPINGO OOPHERECTOMY Bilateral 10/22/2021   Procedure: XI ROBOTIC ASSISTED TOTAL HYSTERECTOMY WITH BILATERAL SALPINGO OOPHORECTOMY;  Surgeon: Carver Fila, MD;  Location: [facility];  Service: Gynecology;  Laterality: Bilateral;    Family History  Problem Relation Age of Onset   Cancer Mother 61       primary peritoneal cancer   Alzheimer's disease Mother    Heart disease Father    Pancreatic cancer Father 36   Hypertension Brother    Heart attack Brother 75       MI x 2   Breast cancer Paternal Aunt 34   Diabetes Paternal Grandmother    Colon cancer Neg Hx    Prostate cancer Neg Hx    Ovarian cancer Neg Hx    Colon polyps Neg Hx    Esophageal cancer Neg Hx    Rectal cancer Neg Hx    Stomach cancer Neg Hx     No Known Allergies  Current Outpatient Medications on File Prior to Visit  Medication Sig Dispense Refill   acetaminophen (TYLENOL) 500 MG tablet Take 500-1,000 mg by mouth every 6 (six) hours as needed (pain.).     aspirin EC 81 MG tablet Take 81 mg by mouth in the morning.     Cholecalciferol (VITAMIN D3 PO) Take 1 tablet by mouth daily.     Continuous Glucose Sensor (FREESTYLE LIBRE 3 SENSOR) MISC PLACE 1 SENSOR ON THE SKIN EVERY 14 DAYS TO CHECK GLUCOSE CONTINUOUSLY 2 each 6   ibuprofen (ADVIL) 600 MG tablet Take 1 tablet (600 mg total) by mouth every 6 (six) hours as needed for moderate pain. For AFTER surgery only  30 tablet 0   losartan-hydrochlorothiazide (HYZAAR) 50-12.5 MG tablet TAKE 1 TABLET BY MOUTH DAILY 90 tablet 1   No current facility-administered medications on file prior to visit.    BP 120/60   Pulse 72   Temp 98.7 F (37.1 C)   Wt 237 lb (107.5 kg)   SpO2  95%   BMI 40.68 kg/m       Objective:   Physical Exam Vitals and nursing note reviewed.  Constitutional:      Appearance: Normal appearance.  Cardiovascular:     Rate and Rhythm: Normal rate and regular rhythm.     Pulses: Normal pulses.     Heart sounds: Normal heart sounds.  Pulmonary:     Effort: Pulmonary effort is normal.     Breath sounds: Normal breath sounds.  Musculoskeletal:        General: Tenderness present. Normal range of motion.     Right hip: Tenderness present. No bony tenderness or crepitus. Normal range of motion. Normal strength.     Comments: No pain in right hip with straight leg raise,knee to chest or internal/external rotation. She had pain with palpation to right groin and along Sartorius muscle   Skin:    General: Skin is warm and dry.     Capillary Refill: Capillary refill takes less than 2 seconds.  Neurological:     General: No focal deficit present.     Mental Status: She is alert and oriented to person, place, and time.  Psychiatric:        Mood and Affect: Mood normal.        Behavior: Behavior normal.        Thought Content: Thought content normal.        Judgment: Judgment normal.       Assessment & Plan:  1. Pre-diabetes  - POC HgB A1c - 5.9  - Will increase Wegovy to 0.5 mg weekly - Follow up in one month or sooner if needed - Semaglutide-Weight Management (WEGOVY) 0.5 MG/0.5ML SOAJ; Inject 0.5 mg into the skin once a week.  Dispense: 2 mL; Refill: 0  2. Obesity, Class II, BMI 35-39.9  - Semaglutide-Weight Management (WEGOVY) 0.5 MG/0.5ML SOAJ; Inject 0.5 mg into the skin once a week.  Dispense: 2 mL; Refill: 0  3. Chronic right hip pain - Will rexray her right hip  today to see if arthritis has become worse. Pain mostly felt along sartorius muscle.  - Stretching exercises given  - Can consider referral to PT - DG Hip Unilat W OR W/O Pelvis 2-3 Views Right; Future  Shirline Frees, NP  Time spent with patient today was 45 minutes which consisted of chart review, discussing pre diabetes, obesity, right hip pain and muscle pain, work up, treatment answering questions and documentation.

## 2023-04-01 ENCOUNTER — Encounter: Payer: Self-pay | Admitting: Gastroenterology

## 2023-04-01 ENCOUNTER — Ambulatory Visit: Payer: Medicare PPO

## 2023-04-01 ENCOUNTER — Other Ambulatory Visit: Payer: Medicare PPO

## 2023-04-01 DIAGNOSIS — M25551 Pain in right hip: Secondary | ICD-10-CM

## 2023-04-01 DIAGNOSIS — G8929 Other chronic pain: Secondary | ICD-10-CM

## 2023-04-01 DIAGNOSIS — M160 Bilateral primary osteoarthritis of hip: Secondary | ICD-10-CM | POA: Diagnosis not present

## 2023-04-01 DIAGNOSIS — R1031 Right lower quadrant pain: Secondary | ICD-10-CM | POA: Diagnosis not present

## 2023-04-02 ENCOUNTER — Other Ambulatory Visit (HOSPITAL_BASED_OUTPATIENT_CLINIC_OR_DEPARTMENT_OTHER): Payer: Self-pay

## 2023-04-12 ENCOUNTER — Encounter: Payer: Self-pay | Admitting: Adult Health

## 2023-04-13 ENCOUNTER — Telehealth: Payer: Self-pay | Admitting: Gastroenterology

## 2023-04-13 ENCOUNTER — Telehealth: Payer: Self-pay

## 2023-04-13 NOTE — Telephone Encounter (Signed)
Inbound call from patient stating she is scheduled for 12/3 for a colonoscopy and is requesting a call to discuss questions she has. Please advise.

## 2023-04-13 NOTE — Telephone Encounter (Signed)
RN returned patient call. Patient wanted to know more specifics about cooked vegetables she could have 5 days prior to colonoscopy. Patient also had questions about day before procedure. RN provided needed information. Patient stated understanding.

## 2023-04-20 ENCOUNTER — Ambulatory Visit: Payer: Medicare PPO | Admitting: Gastroenterology

## 2023-04-20 ENCOUNTER — Encounter: Payer: Self-pay | Admitting: Gastroenterology

## 2023-04-20 VITALS — BP 118/64 | HR 76 | Temp 97.0°F | Resp 14 | Ht 64.0 in | Wt 237.0 lb

## 2023-04-20 DIAGNOSIS — K5730 Diverticulosis of large intestine without perforation or abscess without bleeding: Secondary | ICD-10-CM | POA: Diagnosis not present

## 2023-04-20 DIAGNOSIS — K644 Residual hemorrhoidal skin tags: Secondary | ICD-10-CM | POA: Diagnosis not present

## 2023-04-20 DIAGNOSIS — K648 Other hemorrhoids: Secondary | ICD-10-CM

## 2023-04-20 DIAGNOSIS — E119 Type 2 diabetes mellitus without complications: Secondary | ICD-10-CM | POA: Diagnosis not present

## 2023-04-20 DIAGNOSIS — Z1211 Encounter for screening for malignant neoplasm of colon: Secondary | ICD-10-CM | POA: Diagnosis not present

## 2023-04-20 DIAGNOSIS — I10 Essential (primary) hypertension: Secondary | ICD-10-CM | POA: Diagnosis not present

## 2023-04-20 DIAGNOSIS — G4730 Sleep apnea, unspecified: Secondary | ICD-10-CM | POA: Diagnosis not present

## 2023-04-20 DIAGNOSIS — E669 Obesity, unspecified: Secondary | ICD-10-CM | POA: Diagnosis not present

## 2023-04-20 MED ORDER — SODIUM CHLORIDE 0.9 % IV SOLN: 500.0000 mL | Freq: Once | INTRAVENOUS | Status: DC

## 2023-04-20 NOTE — Patient Instructions (Signed)
Handouts provided on diverticulosis and hemorrhoids.  Resume previous diet.  Continue present medications.  No repeat colonoscopy due to age.   YOU HAD AN ENDOSCOPIC PROCEDURE TODAY AT THE [facility]:   Refer to the procedure report that was given to you for any specific questions about what was found during the examination.  If the procedure report does not answer your questions, please call your gastroenterologist to clarify.  If you requested that your care partner not be given the details of your procedure findings, then the procedure report has been included in a sealed envelope for you to review at your convenience later.  YOU SHOULD EXPECT: Some feelings of bloating in the abdomen. Passage of more gas than usual.  Walking can help get rid of the air that was put into your GI tract during the procedure and reduce the bloating. If you had a lower endoscopy (such as a colonoscopy or flexible sigmoidoscopy) you may notice spotting of blood in your stool or on the toilet paper. If you underwent a bowel prep for your procedure, you may not have a normal bowel movement for a few days.  Please Note:  You might notice some irritation and congestion in your nose or some drainage.  This is from the oxygen used during your procedure.  There is no need for concern and it should clear up in a day or so.  SYMPTOMS TO REPORT IMMEDIATELY:  Following lower endoscopy (colonoscopy or flexible sigmoidoscopy):  Excessive amounts of blood in the stool  Significant tenderness or worsening of abdominal pains  Swelling of the abdomen that is new, acute  Fever of 100F or higher  For urgent or emergent issues, a gastroenterologist can be reached at any hour by calling [phone]. Do not use MyChart messaging for urgent concerns.    DIET:  We do recommend a small meal at first, but then you may proceed to your regular diet.  Drink plenty of fluids but you should avoid alcoholic beverages for 24  hours.  ACTIVITY:  You should plan to take it easy for the rest of today and you should NOT DRIVE or use heavy machinery until tomorrow (because of the sedation medicines used during the test).    FOLLOW UP: Our staff will call the number listed on your records the next business day following your procedure.  We will call around 7:15- 8:00 am to check on you and address any questions or concerns that you may have regarding the information given to you following your procedure. If we do not reach you, we will leave a message.     If any biopsies were taken you will be contacted by phone or by letter within the next 1-3 weeks.  Please call us at [phone] if you have not heard about the biopsies in 3 weeks.    SIGNATURES/CONFIDENTIALITY: You and/or your care partner have signed paperwork which will be entered into your electronic medical record.  These signatures attest to the fact that that the information above on your After Visit Summary has been reviewed and is understood.  Full responsibility of the confidentiality of this discharge information lies with you and/or your care-partner.

## 2023-04-20 NOTE — Progress Notes (Signed)
Pt's states no medical or surgical changes since previsit or office visit. 

## 2023-04-20 NOTE — Progress Notes (Signed)
[facility] History and Physical   Primary Care Physician:  Shirline Frees, NP   Reason for Procedure:  Colorectal cancer screening  Plan:    Screening colonoscopy with possible interventions as needed     HPI: Kylie Davis is a very pleasant 72 y.o. female here for screening colonoscopy. Denies any nausea, vomiting, abdominal pain, melena or bright red blood per rectum  The risks and benefits as well as alternatives of endoscopic procedure(s) have been discussed and reviewed. All questions answered. The patient agrees to proceed.    Past Medical History:  Diagnosis Date   Arthritis    BMI 39.0-39.9,adult    Diabetes (HCC)    on Wegovy   Endometrial cancer (HCC) 09/2021   sx and radiation   Family history of pancreatic cancer    Family history of peritoneal cancer    History of radiation therapy    Endometrium- HDR 12/16/21-01/15/20- Dr. Antony Blackbird   Hypertension    PONV (postoperative nausea and vomiting)    Sleep apnea    C PAP    Past Surgical History:  Procedure Laterality Date   CHOLECYSTECTOMY  05/18/2002   COLONOSCOPY  02/24/2013   DB-MAC-moviprep(exc)-normal-10 yr recall   LYMPH NODE BIOPSY N/A 10/22/2021   Procedure: Sentinel LYMPH NODE BIOPSY;  Surgeon: Carver Fila, MD;  Location: [facility];  Service: Gynecology;  Laterality: N/A;   ROBOTIC ASSISTED TOTAL HYSTERECTOMY WITH BILATERAL SALPINGO OOPHERECTOMY Bilateral 10/22/2021   Procedure: XI ROBOTIC ASSISTED TOTAL HYSTERECTOMY WITH BILATERAL SALPINGO OOPHORECTOMY;  Surgeon: Carver Fila, MD;  Location: [facility];  Service: Gynecology;  Laterality: Bilateral;    Prior to Admission medications   Medication Sig Start Date End Date Taking? Authorizing Provider  aspirin EC 81 MG tablet Take 81 mg by mouth in the morning.   Yes [provider]  Cholecalciferol (VITAMIN D3 PO) Take 1 tablet by mouth daily.   Yes [provider]  losartan-hydrochlorothiazide  (HYZAAR) 50-12.5 MG tablet TAKE 1 TABLET BY MOUTH DAILY 08/26/22  Yes Nafziger, Kandee Keen, NP  acetaminophen (TYLENOL) 500 MG tablet Take 500-1,000 mg by mouth every 6 (six) hours as needed (pain.).    [provider]  Continuous Glucose Sensor (FREESTYLE LIBRE 3 SENSOR) MISC PLACE 1 SENSOR ON THE SKIN EVERY 14 DAYS TO CHECK GLUCOSE CONTINUOUSLY 11/12/22   Nafziger, Kandee Keen, NP  ibuprofen (ADVIL) 600 MG tablet Take 1 tablet (600 mg total) by mouth every 6 (six) hours as needed for moderate pain. For AFTER surgery only 10/06/21   Warner Mccreedy D, NP  Semaglutide-Weight Management (WEGOVY) 0.5 MG/0.5ML SOAJ Inject 0.5 mg into the skin once a week. 03/31/23   Shirline Frees, NP    Current Outpatient Medications  Medication Sig Dispense Refill   aspirin EC 81 MG tablet Take 81 mg by mouth in the morning.     Cholecalciferol (VITAMIN D3 PO) Take 1 tablet by mouth daily.     losartan-hydrochlorothiazide (HYZAAR) 50-12.5 MG tablet TAKE 1 TABLET BY MOUTH DAILY 90 tablet 1   acetaminophen (TYLENOL) 500 MG tablet Take 500-1,000 mg by mouth every 6 (six) hours as needed (pain.).     Continuous Glucose Sensor (FREESTYLE LIBRE 3 SENSOR) MISC PLACE 1 SENSOR ON THE SKIN EVERY 14 DAYS TO CHECK GLUCOSE CONTINUOUSLY 2 each 6   ibuprofen (ADVIL) 600 MG tablet Take 1 tablet (600 mg total) by mouth every 6 (six) hours as needed for moderate pain. For AFTER surgery only 30 tablet 0   Semaglutide-Weight Management (WEGOVY)  0.5 MG/0.5ML SOAJ Inject 0.5 mg into the skin once a week. 2 mL 0   Current Facility-Administered Medications  Medication Dose Route Frequency Provider Last Rate Last Admin   0.9 %  sodium chloride infusion  500 mL Intravenous Once Napoleon Form, MD        Allergies as of 04/20/2023   (No Known Allergies)    Family History  Problem Relation Age of Onset   Cancer Mother 73       primary peritoneal cancer   Alzheimer's disease Mother    Heart disease Father    Pancreatic cancer Father  24   Hypertension Brother    Heart attack Brother 73       MI x 2   Breast cancer Paternal Aunt 13   Diabetes Paternal Grandmother    Colon cancer Neg Hx    Prostate cancer Neg Hx    Ovarian cancer Neg Hx    Colon polyps Neg Hx    Esophageal cancer Neg Hx    Rectal cancer Neg Hx    Stomach cancer Neg Hx     Social History   Socioeconomic History   Marital status: Married    Spouse name: Not on file   Number of children: Not on file   Years of education: Not on file   Highest education level: Not on file  Occupational History   Occupation: retired Magazine features editor: RETIRED  Tobacco Use   Smoking status: Never   Smokeless tobacco: Never  Vaping Use   Vaping status: Never Used  Substance and Sexual Activity   Alcohol use: Yes    Alcohol/week: 5.0 standard drinks of alcohol    Types: 5 Standard drinks or equivalent per week    Comment: occas   Drug use: No   Sexual activity: Not Currently    Birth control/protection: Post-menopausal    Comment: 1st intercourse 72 yo.---Fewer than 5 partners  Other Topics Concern   Not on file  Social History Narrative   Retired from being a Engineer, site.    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 years    0 children    Social Determinants of Health   Financial Resource Strain: Low Risk  (01/22/2023)   Overall Financial Resource Strain (CARDIA)    Difficulty of Paying Living Expenses: Not hard at all  Food Insecurity: No Food Insecurity (01/22/2023)   Hunger Vital Sign    Worried About Running Out of Food in the Last Year: Never true    Ran Out of Food in the Last Year: Never true  Transportation Needs: No Transportation Needs (01/22/2023)   PRAPARE - Administrator, Civil Service (Medical): No    Lack of Transportation (Non-Medical): No  Physical Activity: Insufficiently Active (01/22/2023)   Exercise Vital Sign    Days of Exercise per Week: 3 days    Minutes of Exercise per Session: 30 min  Stress: No Stress Concern Present (01/22/2023)    [employer] of Occupational Health - Occupational Stress Questionnaire    Feeling of Stress : Not at all  Social Connections: Socially Integrated (01/22/2023)   Social Connection and Isolation Panel [NHANES]    Frequency of Communication with Friends and Family: More than three times a week    Frequency of Social Gatherings with Friends and Family: More than three times a week    Attends Religious Services: More than 4 times per year    Active Member of [employer] or Organizations: Yes  Attends Banker Meetings: More than 4 times per year    Marital Status: Married  Catering manager Violence: Not At Risk (01/22/2023)   Humiliation, Afraid, Rape, and Kick questionnaire    Fear of Current or Ex-Partner: No    Emotionally Abused: No    Physically Abused: No    Sexually Abused: No    Review of Systems:  All other review of systems negative except as mentioned in the HPI.  Physical Exam: Vital signs in last 24 hours: BP (!) 160/73   Pulse 75   Temp (!) 97 F (36.1 C) (Temporal)   Ht 5\' 4"  (1.626 m)   Wt 237 lb (107.5 kg)   SpO2 99%   BMI 40.68 kg/m  General:   Alert, NAD Lungs:  Clear .   Heart:  Regular rate and rhythm Abdomen:  Soft, nontender and nondistended. Neuro/Psych:  Alert and cooperative. Normal mood and affect. A and O x 3  Reviewed labs, radiology imaging, old records and pertinent past GI work up  Patient is appropriate for planned procedure(s) and anesthesia in an ambulatory setting   K. Scherry Ran , MD [phone]

## 2023-04-20 NOTE — Op Note (Signed)
[facility] Patient Name: Kylie Davis Procedure Date: 04/20/2023 11:00 AM MRN: [id number] Endoscopist: Napoleon Form , MD, [id number] Age: 72 Referring MD:  Date of Birth: 09-27-1950 Gender: Female Account #: [id number] Procedure:                Colonoscopy Indications:              Screening for colorectal malignant neoplasm Medicines:                Monitored Anesthesia Care Procedure:                Pre-Anesthesia Assessment:                           - Prior to the procedure, a History and Physical                            was performed, and patient medications and                            allergies were reviewed. The patient's tolerance of                            previous anesthesia was also reviewed. The risks                            and benefits of the procedure and the sedation                            options and risks were discussed with the patient.                            All questions were answered, and informed consent                            was obtained. Prior Anticoagulants: The patient has                            taken no anticoagulant or antiplatelet agents. ASA                            Grade Assessment: III - A patient with severe                            systemic disease. After reviewing the risks and                            benefits, the patient was deemed in satisfactory                            condition to undergo the procedure.                           After obtaining informed consent, the colonoscope  was passed under direct vision. Throughout the                            procedure, the patient's blood pressure, pulse, and                            oxygen saturations were monitored continuously. The                            Olympus Scope [id number] was introduced through the                            anus and advanced to the the cecum, identified by                             appendiceal orifice and ileocecal valve. The                            colonoscopy was performed without difficulty. The                            patient tolerated the procedure well. The quality                            of the bowel preparation was good. The ileocecal                            valve, appendiceal orifice, and rectum were                            photographed. Scope In: 11:07:17 AM Scope Out: 11:18:35 AM Scope Withdrawal Time: 0 hours 7 minutes 20 seconds  Total Procedure Duration: 0 hours 11 minutes 18 seconds  Findings:                 The perianal and digital rectal examinations were                            normal.                           Scattered large-mouthed, medium-mouthed and                            small-mouthed diverticula were found in the sigmoid                            colon, descending colon, transverse colon and                            ascending colon. There was evidence of diverticular                            spasm. Peri-diverticular erythema was seen. There  was evidence of an impacted diverticulum.                           Non-bleeding external and internal hemorrhoids were                            found during retroflexion. The hemorrhoids were                            medium-sized. Complications:            No immediate complications. Estimated Blood Loss:     Estimated blood loss was minimal. Impression:               - Moderate diverticulosis in the sigmoid colon, in                            the descending colon, in the transverse colon and                            in the ascending colon. There was evidence of                            diverticular spasm. Peri-diverticular erythema was                            seen. There was evidence of an impacted                            diverticulum.                           - Non-bleeding external and internal hemorrhoids.                            - No specimens collected. Recommendation:           - Patient has a contact number available for                            emergencies. The signs and symptoms of potential                            delayed complications were discussed with the                            patient. Return to normal activities tomorrow.                            Written discharge instructions were provided to the                            patient.                           - Resume previous diet.                           -  Continue present medications.                           - No repeat colonoscopy due to age. Napoleon Form, MD 04/20/2023 11:23:28 AM This report has been signed electronically.

## 2023-04-20 NOTE — Progress Notes (Signed)
Vss nad trans to pacu 

## 2023-04-21 ENCOUNTER — Telehealth: Payer: Self-pay

## 2023-04-21 ENCOUNTER — Other Ambulatory Visit: Payer: Self-pay | Admitting: Adult Health

## 2023-04-21 DIAGNOSIS — G8929 Other chronic pain: Secondary | ICD-10-CM

## 2023-04-21 NOTE — Telephone Encounter (Signed)
  Follow up Call-     04/20/2023   10:48 AM  Call back number  Post procedure Call Back phone  # [phone]  Permission to leave phone message Yes     Patient questions:  Do you have a fever, pain , or abdominal swelling? No. Pain Score  0 *  Have you tolerated food without any problems? Yes.    Have you been able to return to your normal activities? Yes.    Do you have any questions about your discharge instructions: Diet   No. Medications  No. Follow up visit  No.  Do you have questions or concerns about your Care? No.  Actions: * If pain score is 4 or above: No action needed, pain <4.

## 2023-04-23 ENCOUNTER — Ambulatory Visit: Payer: Medicare PPO | Admitting: Gynecologic Oncology

## 2023-04-30 ENCOUNTER — Inpatient Hospital Stay: Payer: Medicare PPO | Attending: Gynecologic Oncology | Admitting: Gynecologic Oncology

## 2023-04-30 ENCOUNTER — Encounter: Payer: Self-pay | Admitting: Gynecologic Oncology

## 2023-04-30 VITALS — BP 146/78 | HR 73 | Temp 99.1°F | Resp 19 | Wt 242.0 lb

## 2023-04-30 DIAGNOSIS — Z90710 Acquired absence of both cervix and uterus: Secondary | ICD-10-CM | POA: Insufficient documentation

## 2023-04-30 DIAGNOSIS — Z08 Encounter for follow-up examination after completed treatment for malignant neoplasm: Secondary | ICD-10-CM | POA: Insufficient documentation

## 2023-04-30 DIAGNOSIS — Z8542 Personal history of malignant neoplasm of other parts of uterus: Secondary | ICD-10-CM | POA: Insufficient documentation

## 2023-04-30 DIAGNOSIS — C541 Malignant neoplasm of endometrium: Secondary | ICD-10-CM

## 2023-04-30 DIAGNOSIS — Z923 Personal history of irradiation: Secondary | ICD-10-CM | POA: Insufficient documentation

## 2023-04-30 DIAGNOSIS — Z90722 Acquired absence of ovaries, bilateral: Secondary | ICD-10-CM | POA: Insufficient documentation

## 2023-04-30 LAB — SURGICAL PATHOLOGY

## 2023-04-30 NOTE — Patient Instructions (Signed)
It was good to see you today.  I do not see or feel any evidence of cancer recurrence on your exam.  I will see you for follow-up in 6 months.  As always, if you develop any new and concerning symptoms before your next visit, please call to see me sooner.   

## 2023-04-30 NOTE — Progress Notes (Signed)
Gynecologic Oncology Return Clinic Visit  04/30/23  Reason for Visit: surveillance in the setting of endometrial cancer   Treatment History: Oncology History  Endometrial cancer (HCC)  09/25/2021 Initial Biopsy   EMB - grade 1 endometrioid adenocarcinoma, MMR IHC intact   10/06/2021 Initial Diagnosis   Endometrial cancer (HCC)   10/22/2021 Surgery   TRH/BSO, SLN bilaterally, LOA  Findings: On EUA, small mobile uterus. On intra-abdominal entry, normal upper abdominal survey. Some adhesions of the omentum to the left abdominal side wall in the upper abdomen. Some adhesions of the ascending colon to the right abdominal side wall and of the cecum to the right IP ligament. 8 cm uterus, normal in appearance. Normal appearing. Bilateral adnexa. No gross adenopathy. Mapping successful to bilateral obturator SLNs. Sigmoid colon adherent to the left IP ligament and left pelvic sidewall.   10/22/2021 Pathology Results   Stage IB, grade 1 MI 63% (difficult to determine given adenomyosis) LVI negative SLN on right negative, no definitive SLN seen in tissue excised on left (lymphoid cells negative for malignancy)   11/21/2021 Imaging   CT A/P: 1. Status post hysterectomy and bilateral salpingo-oophorectomy. 2. There are a few small nodules noted in the anterior abdomen adjacent to the left hepatic lobe, all measuring less than 5 mm. Recommend attention on future studies. A follow-up CT abdomen/pelvis in 4-6 months is recommended. 3. Status post cholecystectomy without biliary dilatation.   12/16/2021 - 01/14/2022 Radiation Therapy   12/16/2021 through 01/14/2022 Site Technique Total Dose (Gy) Dose per Fx (Gy) Completed Fx Beam Energies  Vagina: Pelvis HDR-brachy 30/30 6 5/5 Ir-192        01/01/2022 Genetic Testing   Negative genetic testing on the CancerNext-Expanded+RNAinsight panel.  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  The report date is 01/01/2022.  The CancerNext-Expanded gene panel offered  by [facility] and includes sequencing and rearrangement analysis for the following 77 genes: AIP, ALK, APC*, ATM*, AXIN2, BAP1, BARD1, BLM, BMPR1A, BRCA1*, BRCA2*, BRIP1*, CDC73, CDH1*, CDK4, CDKN1B, CDKN2A, CHEK2*, CTNNA1, DICER1, FANCC, FH, FLCN, GALNT12, KIF1B, LZTR1, MAX, MEN1, MET, MLH1*, MSH2*, MSH3, MSH6*, MUTYH*, NBN, NF1*, NF2, NTHL1, PALB2*, PHOX2B, PMS2*, POT1, PRKAR1A, PTCH1, PTEN*, RAD51C*, RAD51D*, RB1, RECQL, RET, SDHA, SDHAF2, SDHB, SDHC, SDHD, SMAD4, SMARCA4, SMARCB1, SMARCE1, STK11, SUFU, TMEM127, TP53*, TSC1, TSC2, VHL and XRCC2 (sequencing and deletion/duplication); EGFR, EGLN1, HOXB13, KIT, MITF, PDGFRA, POLD1, and POLE (sequencing only); EPCAM and GREM1 (deletion/duplication only). DNA and RNA analyses performed for * genes.      Interval History: Doing well.  Denies any abdominal or pelvic pain.  Denies any vaginal bleeding or discharge.  Reports baseline bowel and bladder function.  Past Medical/Surgical History: Past Medical History:  Diagnosis Date   Arthritis    BMI 39.0-39.9,adult    Diabetes (HCC)    on Wegovy   Endometrial cancer (HCC) 09/2021   sx and radiation   Family history of pancreatic cancer    Family history of peritoneal cancer    History of radiation therapy    Endometrium- HDR 12/16/21-01/15/20- Dr. Antony Blackbird   Hypertension    PONV (postoperative nausea and vomiting)    Sleep apnea    C PAP    Past Surgical History:  Procedure Laterality Date   CHOLECYSTECTOMY  05/18/2002   COLONOSCOPY  02/24/2013   DB-MAC-moviprep(exc)-normal-10 yr recall   LYMPH NODE BIOPSY N/A 10/22/2021   Procedure: Sentinel LYMPH NODE BIOPSY;  Surgeon: Carver Fila, MD;  Location: [facility];  Service: Gynecology;  Laterality: N/A;  ROBOTIC ASSISTED TOTAL HYSTERECTOMY WITH BILATERAL SALPINGO OOPHERECTOMY Bilateral 10/22/2021   Procedure: XI ROBOTIC ASSISTED TOTAL HYSTERECTOMY WITH BILATERAL SALPINGO OOPHORECTOMY;  Surgeon: Carver Fila, MD;  Location:  [facility];  Service: Gynecology;  Laterality: Bilateral;    Family History  Problem Relation Age of Onset   Cancer Mother 28       primary peritoneal cancer   Alzheimer's disease Mother    Heart disease Father    Pancreatic cancer Father 19   Hypertension Brother    Heart attack Brother 4       MI x 2   Breast cancer Paternal Aunt 32   Diabetes Paternal Grandmother    Colon cancer Neg Hx    Prostate cancer Neg Hx    Ovarian cancer Neg Hx    Colon polyps Neg Hx    Esophageal cancer Neg Hx    Rectal cancer Neg Hx    Stomach cancer Neg Hx     Social History   Socioeconomic History   Marital status: Married    Spouse name: Not on file   Number of children: Not on file   Years of education: Not on file   Highest education level: Not on file  Occupational History   Occupation: retired Magazine features editor: RETIRED  Tobacco Use   Smoking status: Never   Smokeless tobacco: Never  Vaping Use   Vaping status: Never Used  Substance and Sexual Activity   Alcohol use: Yes    Alcohol/week: 5.0 standard drinks of alcohol    Types: 5 Standard drinks or equivalent per week    Comment: occas   Drug use: No   Sexual activity: Not Currently    Birth control/protection: Post-menopausal    Comment: 1st intercourse 72 yo.---Fewer than 5 partners  Other Topics Concern   Not on file  Social History Narrative   Retired from being a Engineer, site.    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 years    0 children    Social Drivers of Corporate investment banker Strain: Low Risk  (01/22/2023)   Overall Financial Resource Strain (CARDIA)    Difficulty of Paying Living Expenses: Not hard at all  Food Insecurity: No Food Insecurity (01/22/2023)   Hunger Vital Sign    Worried About Running Out of Food in the Last Year: Never true    Ran Out of Food in the Last Year: Never true  Transportation Needs: No Transportation Needs (01/22/2023)   PRAPARE - Administrator, Civil Service (Medical): No    Lack of  Transportation (Non-Medical): No  Physical Activity: Insufficiently Active (01/22/2023)   Exercise Vital Sign    Days of Exercise per Week: 3 days    Minutes of Exercise per Session: 30 min  Stress: No Stress Concern Present (01/22/2023)   [employer] of Occupational Health - Occupational Stress Questionnaire    Feeling of Stress : Not at all  Social Connections: Socially Integrated (01/22/2023)   Social Connection and Isolation Panel [NHANES]    Frequency of Communication with Friends and Family: More than three times a week    Frequency of Social Gatherings with Friends and Family: More than three times a week    Attends Religious Services: More than 4 times per year    Active Member of [employer] or Organizations: Yes    Attends Engineer, structural: More than 4 times per year    Marital Status: Married    Current Medications:  Current  Outpatient Medications:    acetaminophen (TYLENOL) 500 MG tablet, Take 500-1,000 mg by mouth every 6 (six) hours as needed (pain.)., Disp: , Rfl:    aspirin EC 81 MG tablet, Take 81 mg by mouth in the morning., Disp: , Rfl:    Cholecalciferol (VITAMIN D3 PO), Take 1 tablet by mouth daily., Disp: , Rfl:    Continuous Glucose Sensor (FREESTYLE LIBRE 3 SENSOR) MISC, PLACE 1 SENSOR ON THE SKIN EVERY 14 DAYS TO CHECK GLUCOSE CONTINUOUSLY, Disp: 2 each, Rfl: 6   ibuprofen (ADVIL) 600 MG tablet, Take 1 tablet (600 mg total) by mouth every 6 (six) hours as needed for moderate pain. For AFTER surgery only, Disp: 30 tablet, Rfl: 0   losartan-hydrochlorothiazide (HYZAAR) 50-12.5 MG tablet, TAKE 1 TABLET BY MOUTH DAILY, Disp: 90 tablet, Rfl: 1   Semaglutide-Weight Management (WEGOVY) 0.5 MG/0.5ML SOAJ, Inject 0.5 mg into the skin once a week., Disp: 2 mL, Rfl: 0  Review of Systems: Denies appetite changes, fevers, chills, fatigue, unexplained weight changes. Denies hearing loss, neck lumps or masses, mouth sores, ringing in ears or voice changes. Denies  cough or wheezing.  Denies shortness of breath. Denies chest pain or palpitations. Denies leg swelling. Denies abdominal distention, pain, blood in stools, constipation, diarrhea, nausea, vomiting, or early satiety. Denies pain with intercourse, dysuria, frequency, hematuria or incontinence. Denies hot flashes, pelvic pain, vaginal bleeding or vaginal discharge.   Denies joint pain, back pain or muscle pain/cramps. Denies itching, rash, or wounds. Denies dizziness, headaches, numbness or seizures. Denies swollen lymph nodes or glands, denies easy bruising or bleeding. Denies anxiety, depression, confusion, or decreased concentration.  Physical Exam: BP (!) 153/71 (BP Location: Right Arm, Patient Position: Sitting) Comment: Notified RN  Pulse 73   Temp 99.1 F (37.3 C) (Oral)   Resp 19   Wt 242 lb (109.8 kg)   SpO2 99%   BMI 41.54 kg/m  General: Alert, oriented, no acute distress. HEENT: Normocephalic, atraumatic, sclera anicteric. Chest: Clear to auscultation bilaterally.  No wheezes or rhonchi. Cardiovascular: Regular rate and rhythm, no murmurs. Abdomen: Obese, soft, nontender.  Normoactive bowel sounds.  No masses or hepatosplenomegaly appreciated.  Well-healed incisions. Extremities: Grossly normal range of motion.  Warm, well perfused.  No edema bilaterally. Skin: No rashes or lesions noted. Lymphatics: No cervical, supraclavicular, or inguinal adenopathy. GU: Normal appearing external genitalia without erythema, excoriation, or lesions.  Speculum exam reveals moderately atrophic vaginal mucosa, no lesions, blood or discharge.  Bimanual exam reveals vagina and cuff are smooth, no masses or nodularity.   Laboratory & Radiologic Studies: None new  Assessment & Plan: Aroura Appleyard is a 72 y.o. 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 Completed adj VBT 12/2021. MSS. MMRp. Germline testing: VUS in RB1.   Patient is  doing well and is NED on exam today.   CT in April 2024 showed stable soft tissue foci in the upper abdomen.  Will plan for repeat scan approximately 1 year from last (April 2025).  If lesions continue to be stable, will discontinue surveillance imaging.   Per NCCN surveillance recommendations, we will continue with surveillance visits every 3 months, alternating between our office and radiation oncology.  Will plan to transition to visits every 6 months next fall.  We discussed signs and symptoms that should prompt a phone call between visits.  20 minutes of total time was spent for this patient encounter, including preparation, face-to-face counseling with the patient and coordination  of care, and documentation of the encounter.  Eugene Garnet, MD  Division of Gynecologic Oncology  Department of Obstetrics and Gynecology  [facility]

## 2023-05-05 ENCOUNTER — Ambulatory Visit: Payer: Medicare PPO | Admitting: Adult Health

## 2023-05-05 ENCOUNTER — Encounter: Payer: Self-pay | Admitting: Adult Health

## 2023-05-05 ENCOUNTER — Other Ambulatory Visit (HOSPITAL_BASED_OUTPATIENT_CLINIC_OR_DEPARTMENT_OTHER): Payer: Self-pay

## 2023-05-05 VITALS — BP 120/78 | HR 86 | Temp 98.3°F | Ht 64.0 in | Wt 242.0 lb

## 2023-05-05 DIAGNOSIS — E66812 Obesity, class 2: Secondary | ICD-10-CM

## 2023-05-05 DIAGNOSIS — R7303 Prediabetes: Secondary | ICD-10-CM | POA: Diagnosis not present

## 2023-05-05 MED ORDER — WEGOVY 1 MG/0.5ML ~~LOC~~ SOAJ
1.0000 mg | SUBCUTANEOUS | 0 refills | Status: DC
  Filled 2023-05-05: qty 6, 84d supply, fill #0

## 2023-05-05 NOTE — Progress Notes (Signed)
Subjective:    Patient ID: Kylie Davis, female    DOB: Jul 01, 1950, 72 y.o.   MRN: [id number]  HPI  72 year old female who  has a past medical history of Arthritis, BMI 39.0-39.9,adult, Diabetes (HCC), Endometrial cancer (HCC) (09/2021), Family history of pancreatic cancer, Family history of peritoneal cancer, History of radiation therapy, Hypertension, PONV (postoperative nausea and vomiting), and Sleep apnea.  She presents to the office today for follow-up regarding prediabetes and obesity.  Her visit a month ago we increased Wegovy to 0.5 mg weekly.  She does report that she has been tolerating this medication well with no side effects.  She has been exercising and eating healthier.  Lab Results  Component Value Date   HGBA1C 5.9 (A) 03/31/2023   HGBA1C 6.4 04/01/2022   HGBA1C 6.3 (H) 10/16/2021   Wt Readings from Last 3 Encounters:  05/05/23 242 lb (109.8 kg)  04/30/23 242 lb (109.8 kg)  04/20/23 237 lb (107.5 kg)    Review of Systems See HPI   Past Medical History:  Diagnosis Date   Arthritis    BMI 39.0-39.9,adult    Diabetes (HCC)    on Wegovy   Endometrial cancer (HCC) 09/2021   sx and radiation   Family history of pancreatic cancer    Family history of peritoneal cancer    History of radiation therapy    Endometrium- HDR 12/16/21-01/15/20- Dr. Antony Blackbird   Hypertension    PONV (postoperative nausea and vomiting)    Sleep apnea    C PAP    Social History   Socioeconomic History   Marital status: Married    Spouse name: Not on file   Number of children: Not on file   Years of education: Not on file   Highest education level: Not on file  Occupational History   Occupation: retired Magazine features editor: RETIRED  Tobacco Use   Smoking status: Never   Smokeless tobacco: Never  Vaping Use   Vaping status: Never Used  Substance and Sexual Activity   Alcohol use: Yes    Alcohol/week: 5.0 standard drinks of alcohol    Types: 5 Standard drinks or  equivalent per week    Comment: occas   Drug use: No   Sexual activity: Not Currently    Birth control/protection: Post-menopausal    Comment: 1st intercourse 72 yo.---Fewer than 5 partners  Other Topics Concern   Not on file  Social History Narrative   Retired from being a Engineer, site.    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 years    0 children    Social Drivers of Corporate investment banker Strain: Low Risk  (01/22/2023)   Overall Financial Resource Strain (CARDIA)    Difficulty of Paying Living Expenses: Not hard at all  Food Insecurity: No Food Insecurity (01/22/2023)   Hunger Vital Sign    Worried About Running Out of Food in the Last Year: Never true    Ran Out of Food in the Last Year: Never true  Transportation Needs: No Transportation Needs (01/22/2023)   PRAPARE - Administrator, Civil Service (Medical): No    Lack of Transportation (Non-Medical): No  Physical Activity: Insufficiently Active (01/22/2023)   Exercise Vital Sign    Days of Exercise per Week: 3 days    Minutes of Exercise per Session: 30 min  Stress: No Stress Concern Present (01/22/2023)   [employer] of Occupational Health - Occupational Stress Questionnaire    Feeling  of Stress : Not at all  Social Connections: Socially Integrated (01/22/2023)   Social Connection and Isolation Panel [NHANES]    Frequency of Communication with Friends and Family: More than three times a week    Frequency of Social Gatherings with Friends and Family: More than three times a week    Attends Religious Services: More than 4 times per year    Active Member of Clubs or Organizations: Yes    Attends Banker Meetings: More than 4 times per year    Marital Status: Married  Catering manager Violence: Not At Risk (01/22/2023)   Humiliation, Afraid, Rape, and Kick questionnaire    Fear of Current or Ex-Partner: No    Emotionally Abused: No    Physically Abused: No    Sexually Abused: No    Past Surgical History:  Procedure  Laterality Date   CHOLECYSTECTOMY  05/18/2002   COLONOSCOPY  02/24/2013   DB-MAC-moviprep(exc)-normal-10 yr recall   LYMPH NODE BIOPSY N/A 10/22/2021   Procedure: Sentinel LYMPH NODE BIOPSY;  Surgeon: Carver Fila, MD;  Location: [facility];  Service: Gynecology;  Laterality: N/A;   ROBOTIC ASSISTED TOTAL HYSTERECTOMY WITH BILATERAL SALPINGO OOPHERECTOMY Bilateral 10/22/2021   Procedure: XI ROBOTIC ASSISTED TOTAL HYSTERECTOMY WITH BILATERAL SALPINGO OOPHORECTOMY;  Surgeon: Carver Fila, MD;  Location: [facility];  Service: Gynecology;  Laterality: Bilateral;    Family History  Problem Relation Age of Onset   Cancer Mother 63       primary peritoneal cancer   Alzheimer's disease Mother    Heart disease Father    Pancreatic cancer Father 84   Hypertension Brother    Heart attack Brother 48       MI x 2   Breast cancer Paternal Aunt 57   Diabetes Paternal Grandmother    Colon cancer Neg Hx    Prostate cancer Neg Hx    Ovarian cancer Neg Hx    Colon polyps Neg Hx    Esophageal cancer Neg Hx    Rectal cancer Neg Hx    Stomach cancer Neg Hx     No Known Allergies  Current Outpatient Medications on File Prior to Visit  Medication Sig Dispense Refill   acetaminophen (TYLENOL) 500 MG tablet Take 500-1,000 mg by mouth every 6 (six) hours as needed (pain.).     aspirin EC 81 MG tablet Take 81 mg by mouth in the morning.     Cholecalciferol (VITAMIN D3 PO) Take 1 tablet by mouth daily.     Continuous Glucose Sensor (FREESTYLE LIBRE 3 SENSOR) MISC PLACE 1 SENSOR ON THE SKIN EVERY 14 DAYS TO CHECK GLUCOSE CONTINUOUSLY 2 each 6   ibuprofen (ADVIL) 600 MG tablet Take 1 tablet (600 mg total) by mouth every 6 (six) hours as needed for moderate pain. For AFTER surgery only 30 tablet 0   losartan-hydrochlorothiazide (HYZAAR) 50-12.5 MG tablet TAKE 1 TABLET BY MOUTH DAILY 90 tablet 1   Semaglutide-Weight Management (WEGOVY) 0.5 MG/0.5ML SOAJ Inject 0.5 mg into the skin once a week. 2 mL 0    No current facility-administered medications on file prior to visit.    BP 120/78   Pulse 86   Temp 98.3 F (36.8 C) (Oral)   Ht 5\' 4"  (1.626 m)   Wt 242 lb (109.8 kg)   SpO2 99%   BMI 41.54 kg/m       Objective:   Physical Exam Vitals and nursing note reviewed.  Constitutional:      Appearance:  Normal appearance. She is obese.  Cardiovascular:     Rate and Rhythm: Normal rate and regular rhythm.     Pulses: Normal pulses.     Heart sounds: Normal heart sounds.  Musculoskeletal:        General: Normal range of motion.  Skin:    General: Skin is warm and dry.  Neurological:     General: No focal deficit present.     Mental Status: She is alert and oriented to person, place, and time.  Psychiatric:        Mood and Affect: Mood normal.        Behavior: Behavior normal.        Thought Content: Thought content normal.        Judgment: Judgment normal.       Assessment & Plan:  1. Pre-diabetes (Primary) - weight loss has been stagnant. Will increase Wegovy to 1 mg or the next three months as she will be down in the [place] for about 10 weeks.   - Semaglutide-Weight Management (WEGOVY) 1 MG/0.5ML SOAJ; Inject 1 mg into the skin once a week.  Dispense: 6 mL; Refill: 0  2. Obesity, Class II, BMI 35-39.9  - Semaglutide-Weight Management (WEGOVY) 1 MG/0.5ML SOAJ; Inject 1 mg into the skin once a week.  Dispense: 6 mL; Refill: 0  Shirline Frees, NP  Time spent with patient today was 30 minutes which consisted of chart review, discussing weight loss management, work up, treatment answering questions and documentation.

## 2023-05-06 ENCOUNTER — Encounter: Payer: Self-pay | Admitting: Physical Therapy

## 2023-05-06 ENCOUNTER — Ambulatory Visit (INDEPENDENT_AMBULATORY_CARE_PROVIDER_SITE_OTHER): Payer: Medicare PPO | Admitting: Physical Therapy

## 2023-05-06 DIAGNOSIS — R262 Difficulty in walking, not elsewhere classified: Secondary | ICD-10-CM

## 2023-05-06 DIAGNOSIS — M6281 Muscle weakness (generalized): Secondary | ICD-10-CM

## 2023-05-06 DIAGNOSIS — M25551 Pain in right hip: Secondary | ICD-10-CM

## 2023-05-06 NOTE — Therapy (Signed)
Erroneous encounter - no charges on this visit- patient leaving for [place] for 2-3 months and would like to do PT there.  10:29 AM, 05/06/23 Tereasa Coop, DPT Physical Therapy with [facility]

## 2023-05-13 ENCOUNTER — Other Ambulatory Visit: Payer: Self-pay | Admitting: Adult Health

## 2023-05-13 ENCOUNTER — Other Ambulatory Visit: Payer: Self-pay

## 2023-05-13 DIAGNOSIS — I10 Essential (primary) hypertension: Secondary | ICD-10-CM

## 2023-05-13 MED ORDER — LOSARTAN POTASSIUM-HCTZ 50-12.5 MG PO TABS: 1.0000 | ORAL_TABLET | Freq: Every day | ORAL | 1 refills | Status: DC

## 2023-05-13 NOTE — Telephone Encounter (Signed)
Copied from CRM [id number]. Topic: Clinical - Medication Refill >> May 13, 2023 10:23 AM Myrtice Lauth wrote: Most Recent Primary Care Visit:  Provider: Shirline Frees  Department: [facility]  Visit Type: OFFICE VISIT  Date: 05/05/2023  Medication: ***  Has the patient contacted their pharmacy?  (Agent: If no, request that the patient contact the pharmacy for the refill. If patient does not wish to contact the pharmacy document the reason why and proceed with request.) (Agent: If yes, when and what did the pharmacy advise?)  Is this the correct pharmacy for this prescription?  If no, delete pharmacy and type the correct one.  This is the patient's preferred pharmacy:  [id number] [place], [place] - [address] AT [facility] OF [address] & [facility] [address] [place] [address] Phone: [phone] Fax: [phone]  [facility] #[id number] - [place], [place] - [address] AT [facility] [address] [place] [address] Phone: [phone] Fax: [phone]  [place] [facility] [address] [place] [address] Phone: [phone] Fax: [phone]  [facility] [address] [place] [address] Phone: [phone] Fax: [phone]   Has the prescription been filled recently?   Is the patient out of the medication?   Has the patient been seen for an appointment in the last year OR does the patient have an upcoming appointment?   Can we respond through MyChart?   Agent: Please be advised that Rx refills may take up to 3 business days. We ask that you follow-up with your pharmacy.

## 2023-05-13 NOTE — Telephone Encounter (Signed)
No medication listed. Tried to call pt no answer.

## 2023-05-14 ENCOUNTER — Ambulatory Visit: Payer: Medicare PPO | Admitting: Adult Health

## 2023-05-17 ENCOUNTER — Ambulatory Visit
Admission: RE | Admit: 2023-05-17 | Discharge: 2023-05-17 | Disposition: A | Discharge status: Home / Self Care | Payer: Medicare PPO | Source: Ambulatory Visit | Attending: Adult Health | Admitting: Adult Health

## 2023-05-17 DIAGNOSIS — Z1231 Encounter for screening mammogram for malignant neoplasm of breast: Secondary | ICD-10-CM | POA: Diagnosis not present

## 2023-05-31 DIAGNOSIS — S63502A Unspecified sprain of left wrist, initial encounter: Secondary | ICD-10-CM | POA: Diagnosis not present

## 2023-05-31 DIAGNOSIS — W1830XA Fall on same level, unspecified, initial encounter: Secondary | ICD-10-CM | POA: Diagnosis not present

## 2023-05-31 DIAGNOSIS — S6992XA Unspecified injury of left wrist, hand and finger(s), initial encounter: Secondary | ICD-10-CM | POA: Diagnosis not present

## 2023-05-31 DIAGNOSIS — M25532 Pain in left wrist: Secondary | ICD-10-CM | POA: Diagnosis not present

## 2023-06-14 ENCOUNTER — Telehealth: Payer: Self-pay | Admitting: Licensed Clinical Social Worker

## 2023-06-14 NOTE — Telephone Encounter (Signed)
[facility] Clinical Social Work  CSW attempted to call pt to gauge interest and ability to attend [facility]) class as patient was on the interest list.  No answer. Left VM with brief introduction and direct contact information.   Jaycub Noorani E Ruven Corradi, LCSW

## 2023-07-05 ENCOUNTER — Other Ambulatory Visit (HOSPITAL_BASED_OUTPATIENT_CLINIC_OR_DEPARTMENT_OTHER): Payer: Self-pay

## 2023-07-22 ENCOUNTER — Ambulatory Visit: Payer: Medicare PPO | Admitting: Radiation Oncology

## 2023-08-13 IMAGING — MG MM DIGITAL SCREENING BILAT W/ TOMO AND CAD
8 series · 8 of 24 positions shown · non-contrast
Comparison: Previous exam(s).

CLINICAL DATA: Screening.

EXAM:
DIGITAL SCREENING BILATERAL MAMMOGRAM WITH TOMOSYNTHESIS AND CAD
TECHNIQUE: Bilateral screening digital craniocaudal and mediolateral oblique
mammograms were obtained. Bilateral screening digital breast
tomosynthesis was performed. The images were evaluated with
computer-aided detection.

[R CC synth-2D]
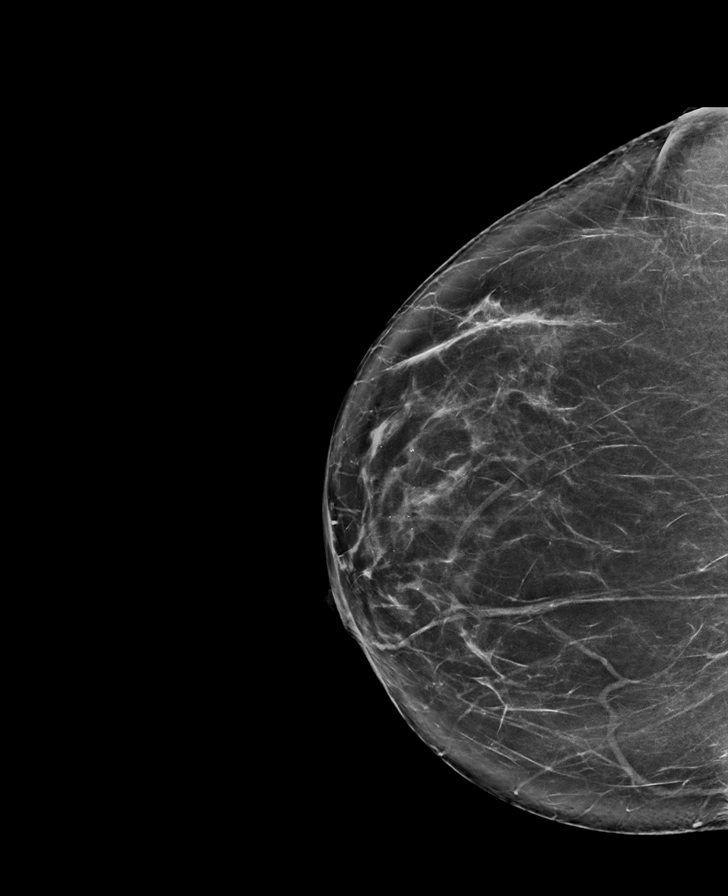

[R MLO synth-2D]
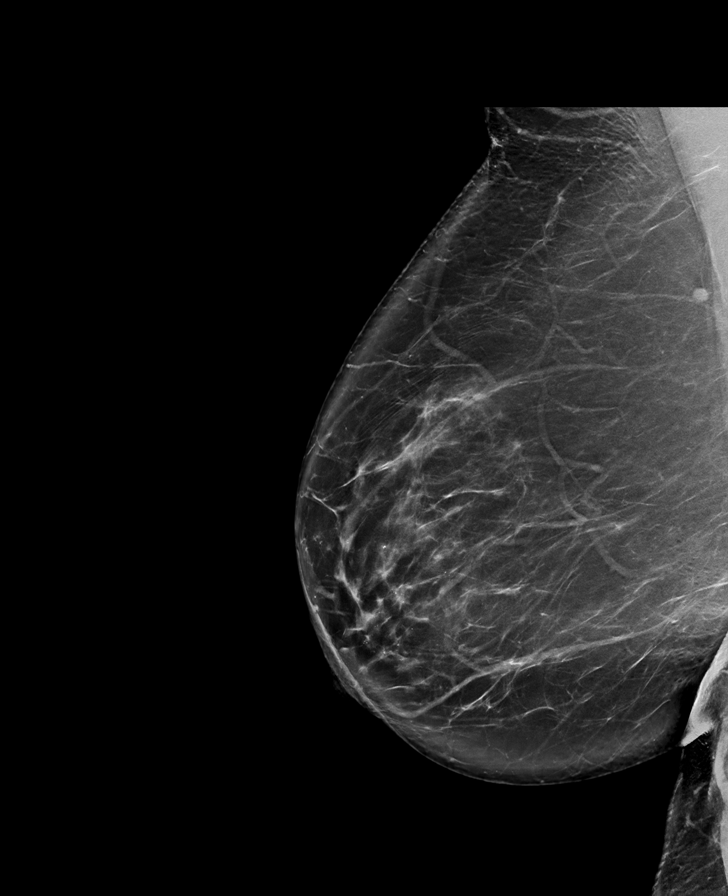

[L CC synth-2D]
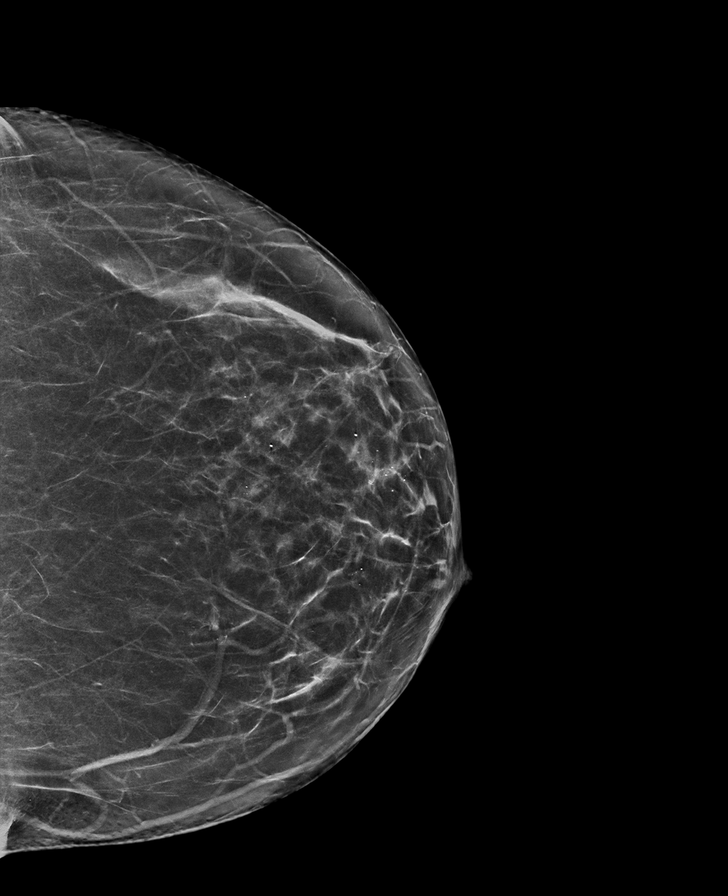

[L MLO synth-2D]
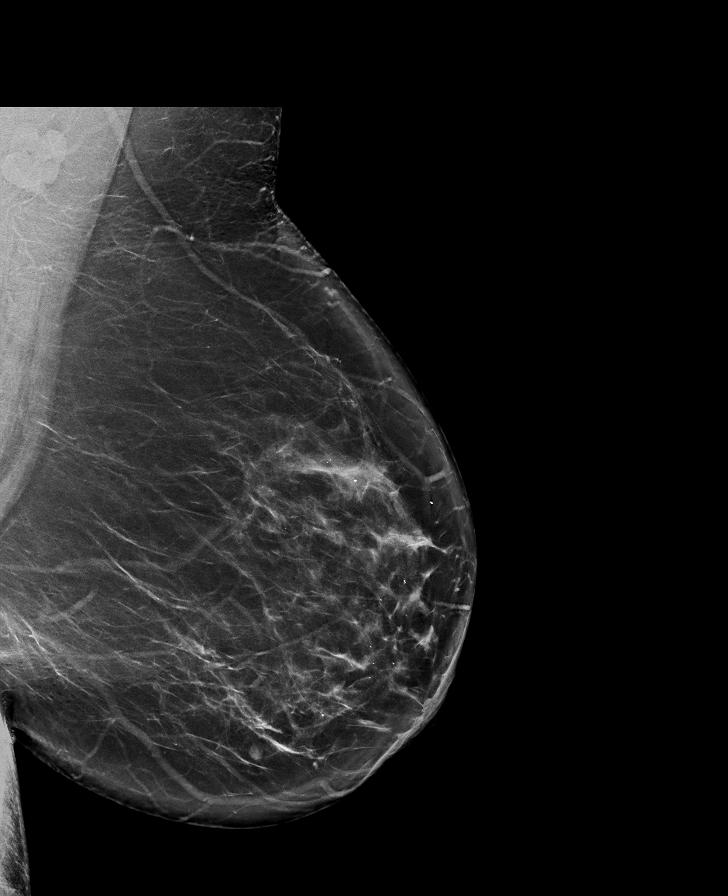

[R CC tomo · tomo slice 41/80.0]
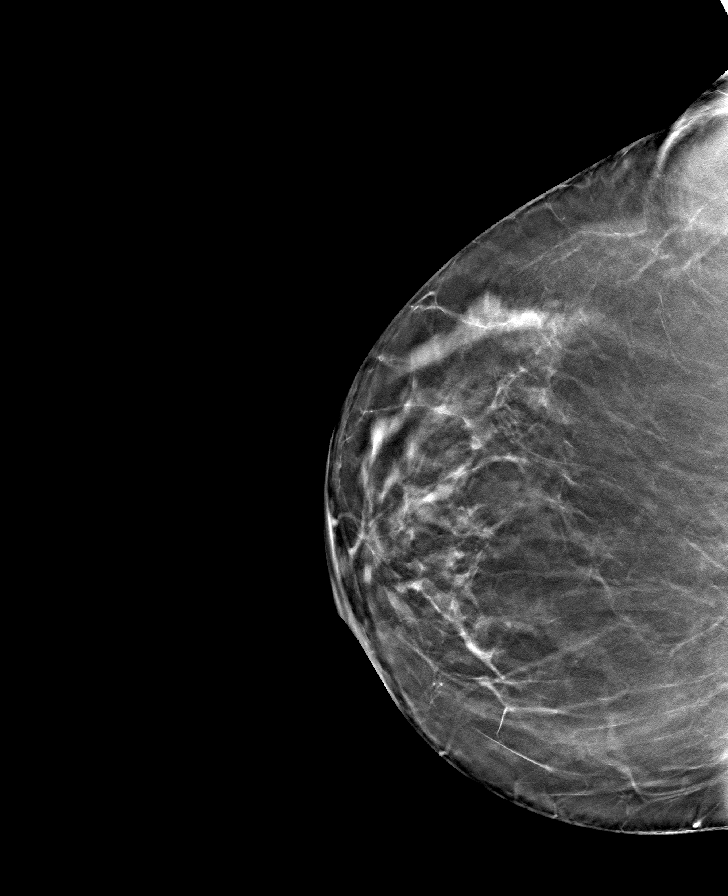

[R MLO tomo · tomo slice 47/94.0]
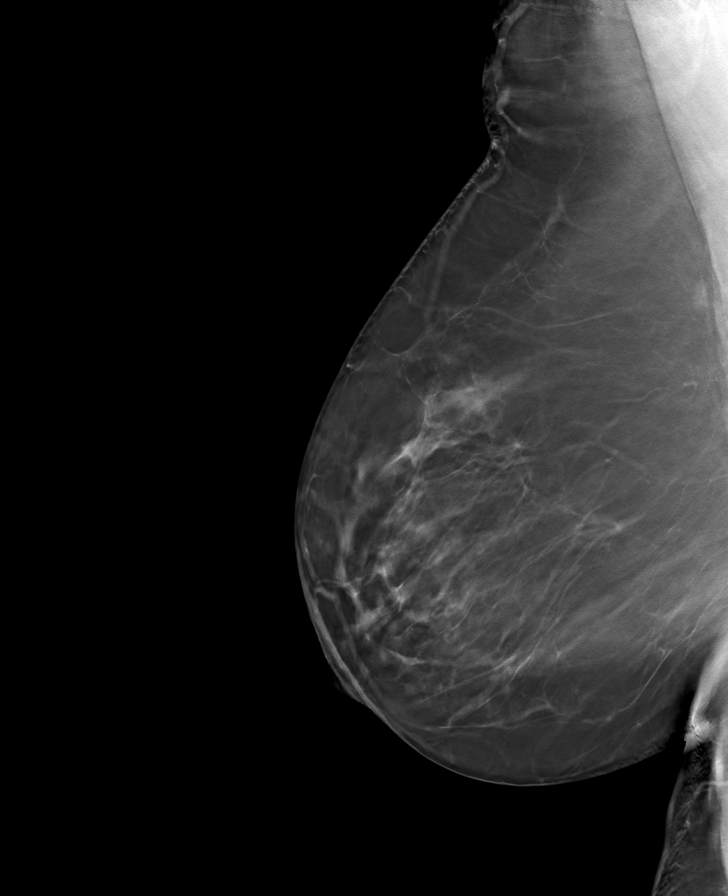

[L MLO tomo · tomo slice 46/91.0]
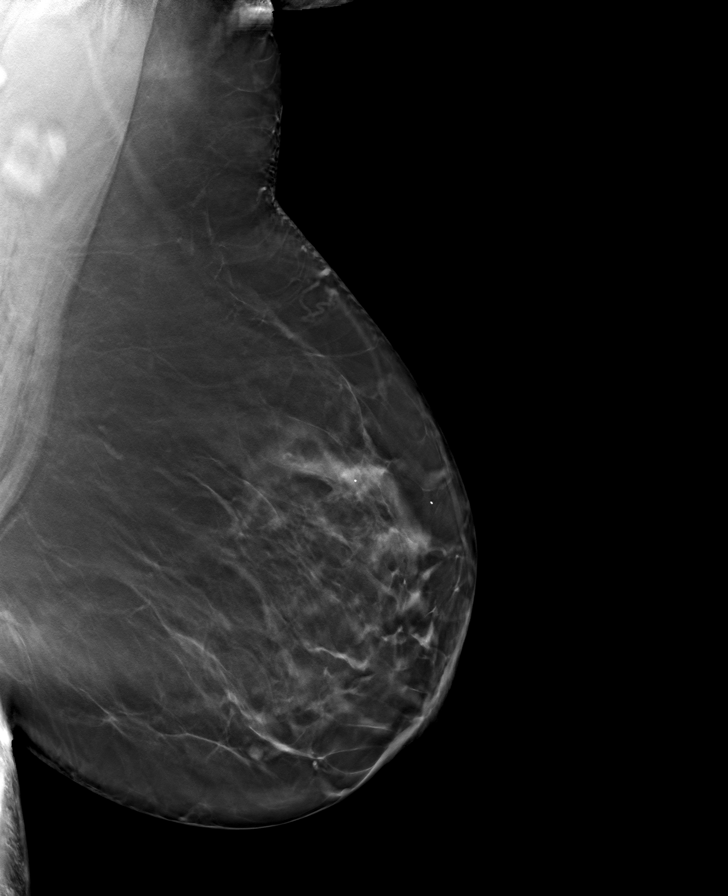

[L CC tomo · tomo slice 37/73.0]
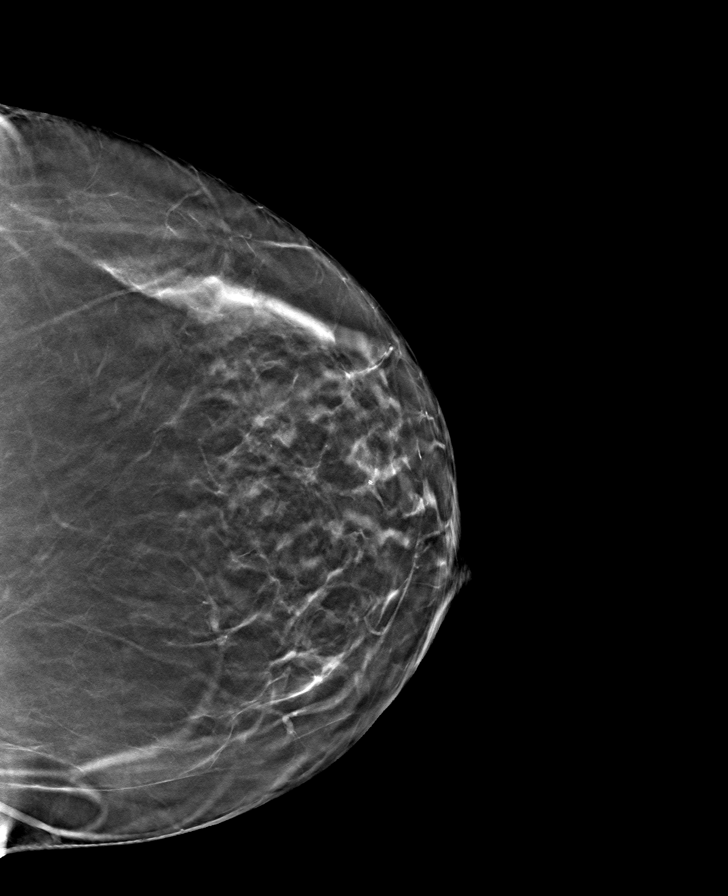

[8 of 24 positions shown; findings below may reference images not displayed]

ACR Breast Density Category c: The breast tissue is heterogeneously
dense, which may obscure small masses.
FINDINGS: In the left breast, a possible asymmetry warrants further
evaluation. In the right breast, no findings suspicious for
malignancy.
IMPRESSION: Further evaluation is suggested for possible asymmetry in the left
breast.

RECOMMENDATION:
Diagnostic mammogram and possibly ultrasound of the left breast.
(Code:8C-V-11X)

The patient will be contacted regarding the findings, and additional
imaging will be scheduled.

BI-RADS CATEGORY  0: Incomplete. Need additional imaging evaluation
and/or prior mammograms for comparison.

## 2023-08-19 DIAGNOSIS — J069 Acute upper respiratory infection, unspecified: Secondary | ICD-10-CM | POA: Diagnosis not present

## 2023-08-19 DIAGNOSIS — R059 Cough, unspecified: Secondary | ICD-10-CM | POA: Diagnosis not present

## 2023-08-19 DIAGNOSIS — Z20822 Contact with and (suspected) exposure to covid-19: Secondary | ICD-10-CM | POA: Diagnosis not present

## 2023-08-22 DIAGNOSIS — J019 Acute sinusitis, unspecified: Secondary | ICD-10-CM | POA: Diagnosis not present

## 2023-08-23 ENCOUNTER — Ambulatory Visit (HOSPITAL_COMMUNITY)
Admission: RE | Admit: 2023-08-23 | Discharge: 2023-08-23 | Disposition: A | Discharge status: Home / Self Care | Payer: Medicare PPO | Source: Ambulatory Visit | Attending: Gynecologic Oncology | Admitting: Gynecologic Oncology

## 2023-08-23 ENCOUNTER — Encounter: Payer: Self-pay | Admitting: Adult Health

## 2023-08-23 DIAGNOSIS — C541 Malignant neoplasm of endometrium: Secondary | ICD-10-CM | POA: Diagnosis not present

## 2023-08-23 DIAGNOSIS — K5790 Diverticulosis of intestine, part unspecified, without perforation or abscess without bleeding: Secondary | ICD-10-CM | POA: Diagnosis not present

## 2023-08-23 DIAGNOSIS — K529 Noninfective gastroenteritis and colitis, unspecified: Secondary | ICD-10-CM | POA: Diagnosis not present

## 2023-08-23 MED ORDER — SODIUM CHLORIDE (PF) 0.9 % IJ SOLN
INTRAMUSCULAR | Status: AC
Start: 2023-08-23 — End: ?
  Filled 2023-08-23: qty 50

## 2023-08-23 MED ORDER — IOHEXOL 9 MG/ML PO SOLN
1000.0000 mL | ORAL | Status: AC
  Administered 2023-08-23: 1000 mL via ORAL

## 2023-08-23 MED ORDER — IOHEXOL 9 MG/ML PO SOLN
ORAL | Status: AC
  Filled 2023-08-23: qty 1000

## 2023-08-23 MED ORDER — IOHEXOL 300 MG/ML  SOLN
100.0000 mL | Freq: Once | INTRAMUSCULAR | Status: AC | PRN
Start: 2023-08-23 — End: 2023-08-23
  Administered 2023-08-23: 100 mL via INTRAVENOUS

## 2023-08-25 NOTE — Progress Notes (Signed)
 Radiation Oncology         [phone] ________________________________  Name: Kylie Davis MRN: [id number]  Date: 08/26/2023  DOB: February 04, 1951  Follow-Up Visit Note  CC: Shirline Frees, NP  Shirline Frees, NP  No diagnosis found.  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 She was last seen here for follow-up on 01/21/23.                     Patient continued to follow up with their specialists to manage their chronic conditions.   Patient presented for a follow up with Dr. Pricilla Holm on 04/30/23 during which she reported feeling well overall and was noted NED on examination.    Imaging preformed since then include: -- colonoscopy on 04/20/23 showing diverticulosis throughout the colon. --Screening mammogram on 05/17/23 showing no mammographic evidence of malignancy   --CT A/P on 08/23/23 showing ***  No other significant oncologic interval history since the patient was last seen.    Allergies:  has no known allergies.  Meds: Current Outpatient Medications  Medication Sig Dispense Refill   acetaminophen (TYLENOL) 500 MG tablet Take 500-1,000 mg by mouth every 6 (six) hours as needed (pain.).     aspirin EC 81 MG tablet Take 81 mg by mouth in the morning.     Cholecalciferol (VITAMIN D3 PO) Take 1 tablet by mouth daily.     Continuous Glucose Sensor (FREESTYLE LIBRE 3 SENSOR) MISC PLACE 1 SENSOR ON THE SKIN EVERY 14 DAYS TO CHECK GLUCOSE CONTINUOUSLY 2 each 6   ibuprofen (ADVIL) 600 MG tablet Take 1 tablet (600 mg total) by mouth every 6 (six) hours as needed for moderate pain. For AFTER surgery only 30 tablet 0    losartan-hydrochlorothiazide (HYZAAR) 50-12.5 MG tablet Take 1 tablet by mouth daily. 90 tablet 1   Semaglutide-Weight Management (WEGOVY) 1 MG/0.5ML SOAJ Inject 1 mg into the skin once a week. 6 mL 0   No current facility-administered medications for this encounter.    Physical Findings: The patient is in no acute distress. Patient is alert and oriented.  vitals were not taken for this visit. .  No significant changes. Lungs are clear to auscultation bilaterally. Heart has regular rate and rhythm. No palpable cervical, supraclavicular, or axillary adenopathy. Abdomen soft, non-tender, normal bowel sounds.   Lab Findings: Lab Results  Component Value Date   WBC 6.1 04/01/2022   HGB 14.0 04/01/2022   HCT 42.1 04/01/2022   MCV 88.5 04/01/2022   PLT 280.0 04/01/2022    Radiographic Findings: No results found.  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  ***  Plan:  ***   *** minutes of total time was spent for this patient encounter, including preparation, face-to-face counseling with the patient and coordination of care, physical exam, and documentation of the encounter. ____________________________________  Billie Lade, PhD, MD  This document serves as a record of services personally performed by Antony Blackbird, MD. It was created on his behalf by Herbie Saxon, a trained medical scribe. The creation of this record is based on the  scribe's personal observations and the provider's statements to them. This document has been checked and approved by the attending provider.

## 2023-08-26 ENCOUNTER — Other Ambulatory Visit: Payer: Self-pay

## 2023-08-26 ENCOUNTER — Ambulatory Visit
Admission: RE | Admit: 2023-08-26 | Discharge: 2023-08-26 | Disposition: A | Discharge status: Home / Self Care | Payer: Medicare PPO | Source: Ambulatory Visit | Attending: Radiation Oncology | Admitting: Radiation Oncology

## 2023-08-26 ENCOUNTER — Encounter: Payer: Self-pay | Admitting: Radiation Oncology

## 2023-08-26 VITALS — BP 164/78 | Temp 97.3°F | Resp 18 | Ht 64.0 in | Wt 240.4 lb

## 2023-08-26 DIAGNOSIS — C541 Malignant neoplasm of endometrium: Secondary | ICD-10-CM | POA: Diagnosis not present

## 2023-08-26 DIAGNOSIS — Z8542 Personal history of malignant neoplasm of other parts of uterus: Secondary | ICD-10-CM | POA: Insufficient documentation

## 2023-08-26 DIAGNOSIS — Z79899 Other long term (current) drug therapy: Secondary | ICD-10-CM | POA: Insufficient documentation

## 2023-08-26 DIAGNOSIS — Z923 Personal history of irradiation: Secondary | ICD-10-CM | POA: Insufficient documentation

## 2023-08-26 NOTE — Progress Notes (Signed)
 Kylie Davis is here today for follow up post radiation to the pelvic.  They completed their radiation on: 01/14/2022  Does the patient complain of any of the following:  Pain:Denies Abdominal bloating: Denies Diarrhea/Constipation: She reports after having the CT scan she had had diarrhea. Nausea/Vomiting: Denies Vaginal Discharge: Denies Blood in Urine or Stool: Denies Urinary Issues (dysuria/incomplete emptying/ incontinence/ increased frequency/urgency): Denies Does patient report using vaginal dilator 2-3 times a week and/or sexually active 2-3 weeks: She reports using her dilator this morning. Post radiation skin changes: Denies   Additional comments if applicable: She is waiting for to get the results of her CT scan.  BP (!) 164/78 (BP Location: Left Arm, Patient Position: Sitting)   Temp (!) 97.3 F (36.3 C) (Temporal)   Resp 18   Ht 5\' 4"  (1.626 m)   Wt 240 lb 6 oz (109 kg)   SpO2 99%   BMI 41.26 kg/m

## 2023-09-01 ENCOUNTER — Ambulatory Visit
Admission: RE | Admit: 2023-09-01 | Discharge: 2023-09-01 | Disposition: A | Discharge status: Home / Self Care | Payer: Medicare PPO | Source: Ambulatory Visit | Attending: Adult Health | Admitting: Adult Health

## 2023-09-01 DIAGNOSIS — M25551 Pain in right hip: Secondary | ICD-10-CM | POA: Diagnosis not present

## 2023-09-01 DIAGNOSIS — G8929 Other chronic pain: Secondary | ICD-10-CM

## 2023-09-01 DIAGNOSIS — M1611 Unilateral primary osteoarthritis, right hip: Secondary | ICD-10-CM | POA: Diagnosis not present

## 2023-09-02 ENCOUNTER — Telehealth: Payer: Self-pay | Admitting: Gynecologic Oncology

## 2023-09-02 DIAGNOSIS — K529 Noninfective gastroenteritis and colitis, unspecified: Secondary | ICD-10-CM

## 2023-09-02 NOTE — Telephone Encounter (Signed)
 Doing well. Called to discuss CT results form 4/7 which resulted today. Given concern for possible colitis or diverticulitis, discussed possibly starting some antibiotics. She is not having any symptoms (no constipation, normal bowel function, denies cramping, no fevers, no nausea or emesis, good appetite). Given recent stomach bug, followed by the fluid, followed by a URI (got amoxicillin and prednisone for this), would favor not treating unless she develops symptoms. We will plan to have her come in Monday for labs (once she is back from the beach). Reviewed symptoms that should prompt a phone call. We will plan to repeat a CT in 8-12 weeks.  Wiley Hanger MD Gynecologic Oncology

## 2023-09-03 ENCOUNTER — Other Ambulatory Visit: Payer: Self-pay | Admitting: Gynecologic Oncology

## 2023-09-03 ENCOUNTER — Telehealth: Payer: Self-pay

## 2023-09-03 DIAGNOSIS — K529 Noninfective gastroenteritis and colitis, unspecified: Secondary | ICD-10-CM

## 2023-09-03 NOTE — Telephone Encounter (Signed)
 Kylie Davis is scheduled for Monday 4/21 @ 9:45 CT A/P scheduled on 10/20/23 @ 11:00 with 8:45 arrival time.  Pt agrees to date/times

## 2023-09-03 NOTE — Telephone Encounter (Signed)
-----   Message from [name] sent at 09/02/2023  4:52 PM EDT ----- Hi all, Can someone make a lab appointment for this patient next Monday?  Labs are in.  Thank you! Kat

## 2023-09-03 NOTE — Telephone Encounter (Signed)
 Per Dr.Tucker,  LVM for patient to call office regarding scheduling a lab appointment for Monday 4/21

## 2023-09-06 ENCOUNTER — Inpatient Hospital Stay: Attending: Gynecologic Oncology

## 2023-09-06 ENCOUNTER — Encounter: Payer: Self-pay | Admitting: Gynecologic Oncology

## 2023-09-06 DIAGNOSIS — C541 Malignant neoplasm of endometrium: Secondary | ICD-10-CM | POA: Insufficient documentation

## 2023-09-06 DIAGNOSIS — K529 Noninfective gastroenteritis and colitis, unspecified: Secondary | ICD-10-CM

## 2023-09-06 LAB — CBC (CANCER CENTER ONLY)
HCT: 39.7 % (ref 36.0–46.0)
Hemoglobin: 13.1 g/dL (ref 12.0–15.0)
MCH: 28.7 pg (ref 26.0–34.0)
MCHC: 33 g/dL (ref 30.0–36.0)
MCV: 87.1 fL (ref 80.0–100.0)
Platelet Count: 258 10*3/uL (ref 150–400)
RBC: 4.56 MIL/uL (ref 3.87–5.11)
RDW: 14.4 % (ref 11.5–15.5)
WBC Count: 5.4 10*3/uL (ref 4.0–10.5)
nRBC: 0 % (ref 0.0–0.2)

## 2023-09-06 LAB — BASIC METABOLIC PANEL - CANCER CENTER ONLY
Anion gap: 5 (ref 5–15)
BUN: 13 mg/dL (ref 8–23)
CO2: 31 mmol/L (ref 22–32)
Calcium: 9.3 mg/dL (ref 8.9–10.3)
Chloride: 102 mmol/L (ref 98–111)
Creatinine: 0.7 mg/dL (ref 0.44–1.00)
GFR, Estimated: >60 mL/min (ref >60)
Glucose, Bld: 171 mg/dL — ABNORMAL HIGH (ref 70–99)
Potassium: 4 mmol/L (ref 3.5–5.1)
Sodium: 138 mmol/L (ref 135–145)

## 2023-09-16 ENCOUNTER — Telehealth: Payer: Self-pay

## 2023-09-16 NOTE — Telephone Encounter (Signed)
 Kylie Davis called stating she needs to reschedule her upcoming appointment with Dr.Tucker on 5/13 d/t she has a dentist appointment that day.   Appointment rescheduled to next available on 5/23 @ 3:45. Pt agreed to date/time

## 2023-09-21 DIAGNOSIS — L821 Other seborrheic keratosis: Secondary | ICD-10-CM | POA: Diagnosis not present

## 2023-09-21 DIAGNOSIS — L820 Inflamed seborrheic keratosis: Secondary | ICD-10-CM | POA: Diagnosis not present

## 2023-09-21 DIAGNOSIS — D225 Melanocytic nevi of trunk: Secondary | ICD-10-CM | POA: Diagnosis not present

## 2023-09-21 DIAGNOSIS — L814 Other melanin hyperpigmentation: Secondary | ICD-10-CM | POA: Diagnosis not present

## 2023-09-21 DIAGNOSIS — L538 Other specified erythematous conditions: Secondary | ICD-10-CM | POA: Diagnosis not present

## 2023-09-21 DIAGNOSIS — L570 Actinic keratosis: Secondary | ICD-10-CM | POA: Diagnosis not present

## 2023-09-22 ENCOUNTER — Other Ambulatory Visit: Payer: Self-pay

## 2023-09-22 DIAGNOSIS — M169 Osteoarthritis of hip, unspecified: Secondary | ICD-10-CM

## 2023-09-23 DIAGNOSIS — G4733 Obstructive sleep apnea (adult) (pediatric): Secondary | ICD-10-CM | POA: Diagnosis not present

## 2023-10-14 ENCOUNTER — Ambulatory Visit: Admitting: Physician Assistant

## 2023-10-14 ENCOUNTER — Encounter: Payer: Self-pay | Admitting: Physician Assistant

## 2023-10-14 VITALS — Ht 64.76 in | Wt 245.8 lb

## 2023-10-14 DIAGNOSIS — M1611 Unilateral primary osteoarthritis, right hip: Secondary | ICD-10-CM | POA: Diagnosis not present

## 2023-10-14 DIAGNOSIS — M79645 Pain in left finger(s): Secondary | ICD-10-CM | POA: Diagnosis not present

## 2023-10-14 NOTE — Progress Notes (Addendum)
 Office Visit Note   Patient: Kylie Davis           Date of Birth: [birth year]           MRN: [id number] Visit Date: 10/14/2023              Requested by: Merna Huxley, NP [address] [place],  [place] [address] PCP: Merna Huxley, NP   Assessment & Plan: Visit Diagnoses:  1. Primary osteoarthritis of right hip   2. Thumb pain, left     Plan:  After reviewing patient's films with her and her current amount of pain in the right hip would not recommend surgical intervention.  She may benefit from a repeat intra-articular injection of the right hip which has been beneficial in the past.  However at this point in time she states that she is not at the point that she feels like she needs it.  She will therefore continue to take her over-the-counter Tylenol  or NSAID as needed for pain.  She will follow-up with Dr. Vernetta in 6 months to see how she is doing overall.  Questions were encouraged and answered.  Her pain gets to the point that she wants an intra-articular injection she can call our office and we can set this up.  Questions were encouraged and answered at length.  Addendum: Patient was complaining of left thumb pain and asked for a brace.  This is at the end of the visit.  She was later seen at [facility] and did not have a brace she was diagnosed with de Quervain's syndrome.  Follow-Up Instructions: Return in about 6 months (around 04/15/2024), or Dr. Vernetta.   Orders:  No orders of the defined types were placed in this encounter.  No orders of the defined types were placed in this encounter.     Procedures: No procedures performed   Clinical Data: No additional findings.   Subjective: Chief Complaint  Patient presents with   Right Hip - Pain    HPI Kylie Davis 73 year old female comes in today with right hip pain.  She was referred by her primary care physician she has had increased pain in the right hip over the last year.  The pains in the  groin.  She does have difficulty donning her shoes and socks.  She did receive an intra-articular injection done at [facility] about a year and a half ago and this lasted for about 3 to 6 months.  Currently she ranks her right hip pain to be 3 out of 10 pain at worst.  Does keep her awake at times.  It is aggravated by prolonged walking.  She uses no assistive device.  She gets good relief with Tylenol  or over the counter NSAIDs. Radiographs dated 04/17/2023 AP pelvis and lateral view right hip: Bilateral hips well located.  No acute fractures.  Bilateral hip arthritis right hip arthritic changes are moderate to moderately severe.  Left hip mild arthritic changes.  Osteophytes off the right femoral head.  No evidence of AVN or bony lesions. MRI right hip without contrast dated 09/01/2023 is reviewed.  This shows moderately severe right hip arthritis.  Degeneration of the labrum.  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  Mild osteoarthritis of SI joints. Review of Systems See HPI otherwise negative  Objective: Vital Signs: Ht 5' 4.76 (1.645 m)   Wt 245 lb 12.8 oz (111.5 kg)   BMI 41.20 kg/m   Physical Exam Constitutional:      Appearance: She  is not ill-appearing or diaphoretic.  Pulmonary:     Effort: Pulmonary effort is normal.  Neurological:     Mental Status: She is alert and oriented to person, place, and time.  Psychiatric:        Mood and Affect: Mood normal.     Ortho Exam Bilateral hips: Good range of motion of left hip without pain.  Right hip she has good external rotation and limited internal rotation.  Internal rotation of the right hip causes pain.  Ambulates without any assistive device. Specialty Comments:  No specialty comments available.  Imaging: No results found.   PMFS History: Patient Active Problem List   Diagnosis Date Noted   Genetic testing 01/02/2022   Family history of peritoneal cancer 12/10/2021   Family history of pancreatic cancer 12/10/2021   Body  mass index (BMI) 40.0-44.9, adult (HCC) 10/22/2021   Endometrial cancer (HCC) 10/06/2021   Obesity, Class II, BMI 35-39.9 09/10/2015   Family history of malignant neoplasm of ovary 01/18/2014   Family history of malignant neoplasm of breast 01/18/2014   Family history of malignant neoplasm of gastrointestinal tract 01/18/2014   OSA (obstructive sleep apnea) 10/10/2013   Sleep apnea 09/13/2013   Allergic rhinitis 09/06/2012   Essential hypertension, benign 09/06/2012   Past Medical History:  Diagnosis Date   Arthritis    BMI 39.0-39.9,adult    Diabetes (HCC)    on Wegovy    Endometrial cancer (HCC) 09/2021   sx and radiation   Family history of pancreatic cancer    Family history of peritoneal cancer    History of radiation therapy    Endometrium- HDR 12/16/21-01/15/20- Dr. Lynwood Nasuti   Hypertension    PONV (postoperative nausea and vomiting)    Sleep apnea    C PAP    Family History  Problem Relation Age of Onset   Cancer Mother 24       primary peritoneal cancer   Alzheimer's disease Mother    Heart disease Father    Pancreatic cancer Father 57   Hypertension Brother    Heart attack Brother 64       MI x 2   Breast cancer Paternal Aunt 8   Diabetes Paternal Grandmother    Colon cancer Neg Hx    Prostate cancer Neg Hx    Ovarian cancer Neg Hx    Colon polyps Neg Hx    Esophageal cancer Neg Hx    Rectal cancer Neg Hx    Stomach cancer Neg Hx     Past Surgical History:  Procedure Laterality Date   CHOLECYSTECTOMY  05/18/2002   COLONOSCOPY  02/24/2013   DB-MAC-moviprep (exc)-normal-10 yr recall   LYMPH NODE BIOPSY N/A 10/22/2021   Procedure: Sentinel LYMPH NODE BIOPSY;  Surgeon: Viktoria Comer SAUNDERS, MD;  Location: [facility];  Service: Gynecology;  Laterality: N/A;   ROBOTIC ASSISTED TOTAL HYSTERECTOMY WITH BILATERAL SALPINGO OOPHERECTOMY Bilateral 10/22/2021   Procedure: XI ROBOTIC ASSISTED TOTAL HYSTERECTOMY WITH BILATERAL SALPINGO OOPHORECTOMY;  Surgeon: Viktoria Comer SAUNDERS, MD;  Location: [facility];  Service: Gynecology;  Laterality: Bilateral;   Social History   Occupational History   Occupation: retired Magazine features editor: RETIRED  Tobacco Use   Smoking status: Never   Smokeless tobacco: Never  Vaping Use   Vaping status: Never Used  Substance and Sexual Activity   Alcohol use: Yes    Alcohol/week: 5.0 standard drinks of alcohol    Types: 5 Standard drinks or equivalent per week  Comment: occas   Drug use: No   Sexual activity: Not Currently    Birth control/protection: Post-menopausal    Comment: 1st intercourse 73 yo.---Fewer than 5 partners

## 2023-10-20 ENCOUNTER — Ambulatory Visit (HOSPITAL_COMMUNITY)

## 2023-10-25 ENCOUNTER — Ambulatory Visit (HOSPITAL_COMMUNITY)
Admission: RE | Admit: 2023-10-25 | Discharge: 2023-10-25 | Disposition: A | Discharge status: Home / Self Care | Source: Ambulatory Visit | Attending: Gynecologic Oncology | Admitting: Gynecologic Oncology

## 2023-10-25 ENCOUNTER — Ambulatory Visit: Payer: Self-pay | Admitting: Gynecologic Oncology

## 2023-10-25 DIAGNOSIS — K5730 Diverticulosis of large intestine without perforation or abscess without bleeding: Secondary | ICD-10-CM | POA: Diagnosis not present

## 2023-10-25 DIAGNOSIS — Z90710 Acquired absence of both cervix and uterus: Secondary | ICD-10-CM | POA: Diagnosis not present

## 2023-10-25 DIAGNOSIS — Z9049 Acquired absence of other specified parts of digestive tract: Secondary | ICD-10-CM | POA: Diagnosis not present

## 2023-10-25 DIAGNOSIS — K529 Noninfective gastroenteritis and colitis, unspecified: Secondary | ICD-10-CM | POA: Diagnosis not present

## 2023-10-25 MED ORDER — IOHEXOL 300 MG/ML  SOLN
100.0000 mL | Freq: Once | INTRAMUSCULAR | Status: AC | PRN
  Administered 2023-10-25: 100 mL via INTRAVENOUS

## 2023-10-25 MED ORDER — IOHEXOL 9 MG/ML PO SOLN
ORAL | Status: AC
  Filled 2023-10-25: qty 1000

## 2023-10-25 MED ORDER — IOHEXOL 9 MG/ML PO SOLN
1000.0000 mL | ORAL | Status: AC
  Administered 2023-10-25: 1000 mL via ORAL

## 2023-10-25 MED ORDER — SODIUM CHLORIDE (PF) 0.9 % IJ SOLN
INTRAMUSCULAR | Status: AC
  Filled 2023-10-25: qty 50

## 2023-10-29 ENCOUNTER — Ambulatory Visit: Payer: Medicare PPO | Admitting: Gynecologic Oncology

## 2023-11-07 ENCOUNTER — Other Ambulatory Visit: Payer: Self-pay | Admitting: Adult Health

## 2023-11-07 DIAGNOSIS — I10 Essential (primary) hypertension: Secondary | ICD-10-CM

## 2023-11-09 ENCOUNTER — Encounter: Payer: Self-pay | Admitting: Gynecologic Oncology

## 2023-11-11 ENCOUNTER — Inpatient Hospital Stay: Attending: Gynecologic Oncology | Admitting: Gynecologic Oncology

## 2023-11-11 ENCOUNTER — Encounter: Payer: Self-pay | Admitting: Gynecologic Oncology

## 2023-11-11 VITALS — BP 140/65 | HR 73 | Temp 98.6°F | Resp 17 | Ht 64.76 in | Wt 246.2 lb

## 2023-11-11 DIAGNOSIS — Z90710 Acquired absence of both cervix and uterus: Secondary | ICD-10-CM | POA: Diagnosis not present

## 2023-11-11 DIAGNOSIS — C541 Malignant neoplasm of endometrium: Secondary | ICD-10-CM

## 2023-11-11 DIAGNOSIS — Z923 Personal history of irradiation: Secondary | ICD-10-CM | POA: Diagnosis not present

## 2023-11-11 DIAGNOSIS — Z90722 Acquired absence of ovaries, bilateral: Secondary | ICD-10-CM | POA: Insufficient documentation

## 2023-11-11 DIAGNOSIS — Z9079 Acquired absence of other genital organ(s): Secondary | ICD-10-CM | POA: Diagnosis not present

## 2023-11-11 DIAGNOSIS — Z08 Encounter for follow-up examination after completed treatment for malignant neoplasm: Secondary | ICD-10-CM | POA: Diagnosis not present

## 2023-11-11 DIAGNOSIS — Z8542 Personal history of malignant neoplasm of other parts of uterus: Secondary | ICD-10-CM | POA: Diagnosis not present

## 2023-11-11 NOTE — Patient Instructions (Signed)
 It was good to see you today.  I do not see or feel any evidence of cancer recurrence on your exam.  We will see you for follow-up in 12 months.  As always, if you develop any new and concerning symptoms before your next visit, please call to see me sooner.

## 2023-11-11 NOTE — Progress Notes (Signed)
 Gynecologic Oncology Return Clinic Visit  11/11/23  Reason for Visit: surveillance in the setting of endometrial cancer   Treatment History: Oncology History Overview Note  [id number]   Endometrial cancer (HCC)  09/25/2021 Initial Biopsy   EMB - grade 1 endometrioid adenocarcinoma, MMR IHC intact   10/06/2021 Initial Diagnosis   Endometrial cancer (HCC)   10/22/2021 Surgery   TRH/BSO, SLN bilaterally, LOA  Findings: On EUA, small mobile uterus. On intra-abdominal entry, normal upper abdominal survey. Some adhesions of the omentum to the left abdominal side wall in the upper abdomen. Some adhesions of the ascending colon to the right abdominal side wall and of the cecum to the right IP ligament. 8 cm uterus, normal in appearance. Normal appearing. Bilateral adnexa. No gross adenopathy. Mapping successful to bilateral obturator SLNs. Sigmoid colon adherent to the left IP ligament and left pelvic sidewall.   10/22/2021 Pathology Results   Stage IB, grade 1 MI 63% (difficult to determine given adenomyosis) LVI negative SLN on right negative, no definitive SLN seen in tissue excised on left (lymphoid cells negative for malignancy)   11/21/2021 Imaging   CT A/P: 1. Status post hysterectomy and bilateral salpingo-oophorectomy. 2. There are a few small nodules noted in the anterior abdomen adjacent to the left hepatic lobe, all measuring less than 5 mm. Recommend attention on future studies. A follow-up CT abdomen/pelvis in 4-6 months is recommended. 3. Status post cholecystectomy without biliary dilatation.   12/16/2021 - 01/14/2022 Radiation Therapy   12/16/2021 through 01/14/2022 Site Technique Total Dose (Gy) Dose per Fx (Gy) Completed Fx Beam Energies  Vagina: Pelvis HDR-brachy 30/30 6 5/5 Ir-192        01/01/2022 Genetic Testing   Negative genetic testing on the CancerNext-Expanded+RNAinsight panel.  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  The report date is 01/01/2022.  The  CancerNext-Expanded gene panel offered by [facility] and includes sequencing and rearrangement analysis for the following 77 genes: AIP, ALK, APC*, ATM*, AXIN2, BAP1, BARD1, BLM, BMPR1A, BRCA1*, BRCA2*, BRIP1*, CDC73, CDH1*, CDK4, CDKN1B, CDKN2A, CHEK2*, CTNNA1, DICER1, FANCC, FH, FLCN, GALNT12, KIF1B, LZTR1, MAX, MEN1, MET, MLH1*, MSH2*, MSH3, MSH6*, MUTYH*, NBN, NF1*, NF2, NTHL1, PALB2*, PHOX2B, PMS2*, POT1, PRKAR1A, PTCH1, PTEN*, RAD51C*, RAD51D*, RB1, RECQL, RET, SDHA, SDHAF2, SDHB, SDHC, SDHD, SMAD4, SMARCA4, SMARCB1, SMARCE1, STK11, SUFU, TMEM127, TP53*, TSC1, TSC2, VHL and XRCC2 (sequencing and deletion/duplication); EGFR, EGLN1, HOXB13, KIT, MITF, PDGFRA, POLD1, and POLE (sequencing only); EPCAM and GREM1 (deletion/duplication only). DNA and RNA analyses performed for * genes.      Interval History: Doing well.  Denies any vaginal bleeding or discharge.  Denies any abdominal or pelvic pain.  Past Medical/Surgical History: Past Medical History:  Diagnosis Date   Arthritis    BMI 39.0-39.9,adult    Diabetes (HCC)    on Wegovy    Endometrial cancer (HCC) 09/2021   sx and radiation   Family history of pancreatic cancer    Family history of peritoneal cancer    History of radiation therapy    Endometrium- HDR 12/16/21-01/15/20- Dr. Lynwood Nasuti   Hypertension    PONV (postoperative nausea and vomiting)    Sleep apnea    C PAP    Past Surgical History:  Procedure Laterality Date   CHOLECYSTECTOMY  05/18/2002   COLONOSCOPY  02/24/2013   DB-MAC-moviprep (exc)-normal-10 yr recall   LYMPH NODE BIOPSY N/A 10/22/2021   Procedure: Sentinel LYMPH NODE BIOPSY;  Surgeon: Viktoria Comer SAUNDERS, MD;  Location: [facility];  Service: Gynecology;  Laterality: N/A;   ROBOTIC  ASSISTED TOTAL HYSTERECTOMY WITH BILATERAL SALPINGO OOPHERECTOMY Bilateral 10/22/2021   Procedure: XI ROBOTIC ASSISTED TOTAL HYSTERECTOMY WITH BILATERAL SALPINGO OOPHORECTOMY;  Surgeon: Viktoria Comer SAUNDERS, MD;  Location: [facility];   Service: Gynecology;  Laterality: Bilateral;    Family History  Problem Relation Age of Onset   Cancer Mother 48       primary peritoneal cancer   Alzheimer's disease Mother    Heart disease Father    Pancreatic cancer Father 42   Hypertension Brother    Heart attack Brother 58       MI x 2   Breast cancer Paternal Aunt 3   Diabetes Paternal Grandmother    Colon cancer Neg Hx    Prostate cancer Neg Hx    Ovarian cancer Neg Hx    Colon polyps Neg Hx    Esophageal cancer Neg Hx    Rectal cancer Neg Hx    Stomach cancer Neg Hx     Social History   Socioeconomic History   Marital status: Married    Spouse name: Not on file   Number of children: Not on file   Years of education: Not on file   Highest education level: Not on file  Occupational History   Occupation: retired Magazine features editor: RETIRED  Tobacco Use   Smoking status: Never   Smokeless tobacco: Never  Vaping Use   Vaping status: Never Used  Substance and Sexual Activity   Alcohol use: Yes    Alcohol/week: 5.0 standard drinks of alcohol    Types: 5 Standard drinks or equivalent per week    Comment: occas   Drug use: No   Sexual activity: Not Currently    Birth control/protection: Post-menopausal    Comment: 1st intercourse 73 yo.---Fewer than 5 partners  Other Topics Concern   Not on file  Social History Narrative   Retired from being a Engineer, site.    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 years    0 children    Social Drivers of Corporate investment banker Strain: Low Risk  (01/22/2023)   Overall Financial Resource Strain (CARDIA)    Difficulty of Paying Living Expenses: Not hard at all  Food Insecurity: No Food Insecurity (01/22/2023)   Hunger Vital Sign    Worried About Running Out of Food in the Last Year: Never true    Ran Out of Food in the Last Year: Never true  Transportation Needs: No Transportation Needs (01/22/2023)   PRAPARE - Administrator, Civil Service (Medical): No    Lack of  Transportation (Non-Medical): No  Physical Activity: Insufficiently Active (01/22/2023)   Exercise Vital Sign    Days of Exercise per Week: 3 days    Minutes of Exercise per Session: 30 min  Stress: No Stress Concern Present (01/22/2023)   [employer] of Occupational Health - Occupational Stress Questionnaire    Feeling of Stress : Not at all  Social Connections: Socially Integrated (01/22/2023)   Social Connection and Isolation Panel    Frequency of Communication with Friends and Family: More than three times a week    Frequency of Social Gatherings with Friends and Family: More than three times a week    Attends Religious Services: More than 4 times per year    Active Member of [employer] or Organizations: Yes    Attends Engineer, structural: More than 4 times per year    Marital Status: Married    Current Medications:  Current Outpatient Medications:  acetaminophen  (TYLENOL ) 500 MG tablet, Take 500-1,000 mg by mouth every 6 (six) hours as needed (pain.)., Disp: , Rfl:    aspirin EC 81 MG tablet, Take 81 mg by mouth in the morning., Disp: , Rfl:    Cholecalciferol (VITAMIN D3 PO), Take 1 tablet by mouth daily., Disp: , Rfl:    Continuous Glucose Sensor (FREESTYLE LIBRE 3 SENSOR) MISC, PLACE 1 SENSOR ON THE SKIN EVERY 14 DAYS TO CHECK GLUCOSE CONTINUOUSLY, Disp: 2 each, Rfl: 6   ibuprofen  (ADVIL ) 600 MG tablet, Take 1 tablet (600 mg total) by mouth every 6 (six) hours as needed for moderate pain. For AFTER surgery only, Disp: 30 tablet, Rfl: 0   losartan -hydrochlorothiazide  (HYZAAR) 50-12.5 MG tablet, TAKE 1 TABLET BY MOUTH DAILY, Disp: 90 tablet, Rfl: 1   Semaglutide -Weight Management (WEGOVY ) 1 MG/0.5ML SOAJ, Inject 1 mg into the skin once a week., Disp: 6 mL, Rfl: 0  Review of Systems: Denies appetite changes, fevers, chills, fatigue, unexplained weight changes. Denies hearing loss, neck lumps or masses, mouth sores, ringing in ears or voice changes. Denies cough or  wheezing.  Denies shortness of breath. Denies chest pain or palpitations. Denies leg swelling. Denies abdominal distention, pain, blood in stools, constipation, diarrhea, nausea, vomiting, or early satiety. Denies pain with intercourse, dysuria, frequency, hematuria or incontinence. Denies hot flashes, pelvic pain, vaginal bleeding or vaginal discharge.   Denies joint pain, back pain or muscle pain/cramps. Denies itching, rash, or wounds. Denies dizziness, headaches, numbness or seizures. Denies swollen lymph nodes or glands, denies easy bruising or bleeding. Denies anxiety, depression, confusion, or decreased concentration.  Physical Exam: BP (!) 140/65 (BP Location: Right Wrist, Patient Position: Sitting)   Pulse 73   Temp 98.6 F (37 C) (Oral)   Resp 17   Ht 5' 4.76 (1.645 m)   Wt 246 lb 3.2 oz (111.7 kg)   SpO2 98%   BMI 41.27 kg/m  General: Alert, oriented, no acute distress. HEENT: Normocephalic, atraumatic, sclera anicteric. Chest: Clear to auscultation bilaterally.  No wheezes or rhonchi. Cardiovascular: Regular rate and rhythm, no murmurs. Abdomen: Obese, soft, nontender.  Normoactive bowel sounds.  No masses or hepatosplenomegaly appreciated.  Well-healed incisions. Extremities: Grossly normal range of motion.  Warm, well perfused.  No edema bilaterally. Skin: No rashes or lesions noted. Lymphatics: No cervical, supraclavicular, or inguinal adenopathy. GU: Normal appearing external genitalia without erythema, excoriation, or lesions.  Speculum exam reveals moderately atrophic vaginal mucosa, no lesions, blood or discharge.  Bimanual exam reveals vagina and cuff are smooth, no masses or nodularity.   Laboratory & Radiologic Studies: None new  Assessment & Plan: Kylie Davis is a 73 y.o. 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 Completed adj VBT 12/2021. MSS. MMRp. Germline testing: VUS in RB1.   Patient is  doing well and is NED on exam today.   CT in 10/2023 shows resolution of area concerning for diverticulitis. Upper abdominal soft tissue foci remain stable, will discontinue surveillance imaging.   Per NCCN surveillance recommendations, we will continue with surveillance visits every 3 months, alternating between our office and radiation oncology.  Will plan to transition to visits every 6 months after her visit with Kylie Davis in Oct 2025.  We discussed signs and symptoms that should prompt a phone call between visits.  22 minutes of total time was spent for this patient encounter, including preparation, face-to-face counseling with the patient and coordination of care, and documentation of the encounter.  Comer Dollar, MD  Division of Gynecologic Oncology  Department of Obstetrics and Gynecology  [facility]

## 2023-11-24 DIAGNOSIS — M654 Radial styloid tenosynovitis [de Quervain]: Secondary | ICD-10-CM | POA: Diagnosis not present

## 2023-11-24 DIAGNOSIS — M25532 Pain in left wrist: Secondary | ICD-10-CM | POA: Diagnosis not present

## 2023-11-30 ENCOUNTER — Telehealth: Payer: Self-pay | Admitting: Radiology

## 2023-11-30 NOTE — Telephone Encounter (Signed)
 Patient was seen on 05/29 we gave her a left thumb spica brace (she was here for her hip that day) and there is no ICD-10 code provided for wrist/thumb, can one please be added to this office visit so [place] can finalized the DME billing.   Patient was later seen and [facility] for her left thumb/wrist.

## 2023-12-01 NOTE — Telephone Encounter (Signed)
 Sent email to [place] that chart has been updated.

## 2023-12-02 ENCOUNTER — Ambulatory Visit: Admitting: Physician Assistant

## 2023-12-03 ENCOUNTER — Telehealth: Payer: Self-pay | Admitting: Licensed Clinical Social Worker

## 2023-12-03 NOTE — Telephone Encounter (Signed)
 [facility] Clinical Social Work  TC to pt to determine interest in and ability to attend FYNN (Finding Your New Normal) class in the fall. Pt is interested and able to attend.  Pt also expressed gratitude for the GYN retreat that occurred and that she found significant benefit from it.   Kylie Mccleery E Kerim Statzer, LCSW

## 2023-12-10 ENCOUNTER — Encounter: Payer: Self-pay | Admitting: Gynecologic Oncology

## 2023-12-14 ENCOUNTER — Other Ambulatory Visit (HOSPITAL_BASED_OUTPATIENT_CLINIC_OR_DEPARTMENT_OTHER): Payer: Self-pay

## 2023-12-14 ENCOUNTER — Other Ambulatory Visit: Payer: Self-pay | Admitting: Adult Health

## 2023-12-14 ENCOUNTER — Ambulatory Visit (INDEPENDENT_AMBULATORY_CARE_PROVIDER_SITE_OTHER): Admitting: Adult Health

## 2023-12-14 ENCOUNTER — Encounter: Payer: Self-pay | Admitting: Adult Health

## 2023-12-14 ENCOUNTER — Ambulatory Visit: Payer: Self-pay | Admitting: Adult Health

## 2023-12-14 VITALS — BP 120/70 | HR 62 | Temp 98.2°F | Ht 64.75 in | Wt 244.0 lb

## 2023-12-14 DIAGNOSIS — E66812 Obesity, class 2: Secondary | ICD-10-CM

## 2023-12-14 DIAGNOSIS — M25551 Pain in right hip: Secondary | ICD-10-CM | POA: Diagnosis not present

## 2023-12-14 DIAGNOSIS — G8929 Other chronic pain: Secondary | ICD-10-CM

## 2023-12-14 DIAGNOSIS — R7303 Prediabetes: Secondary | ICD-10-CM

## 2023-12-14 LAB — HEMOGLOBIN A1C: Hgb A1c MFr Bld: 6.4 % (ref 4.6–6.5)

## 2023-12-14 LAB — BASIC METABOLIC PANEL WITH GFR
BUN: 17 mg/dL (ref 6–23)
CO2: 29 meq/L (ref 19–32)
Calcium: 9.5 mg/dL (ref 8.4–10.5)
Chloride: 100 meq/L (ref 96–112)
Creatinine, Ser: 0.74 mg/dL (ref 0.40–1.20)
GFR: 79.81 mL/min (ref >60.00)
Glucose, Bld: 170 mg/dL — ABNORMAL HIGH (ref 70–99)
Potassium: 4 meq/L (ref 3.5–5.1)
Sodium: 138 meq/L (ref 135–145)

## 2023-12-14 MED ORDER — WEGOVY 0.25 MG/0.5ML ~~LOC~~ SOAJ
0.2500 mg | SUBCUTANEOUS | 0 refills | Status: DC
  Filled 2023-12-14: qty 2, 28d supply, fill #0

## 2023-12-14 NOTE — Progress Notes (Signed)
 Subjective:    Patient ID: Kylie Davis, female    DOB: 10-17-1950, 73 y.o.   MRN: [id number]  HPI  73 year old female who  has a past medical history of Arthritis, BMI 39.0-39.9,adult, Diabetes (HCC), Endometrial cancer (HCC) (09/2021), Family history of pancreatic cancer, Family history of peritoneal cancer, History of radiation therapy, Hypertension, PONV (postoperative nausea and vomiting), and Sleep apnea.  She presents to the office today for multiple issues.  She has no right-sided hip pain with her last MRI being in April 2025 which showed moderate osteoarthritis, right labral degeneration and anterior superior labral tear.  She was seen by orthopedics who advised against hip replacement at this time.  She would like to do physical therapy to see if this will help with some of her pain.  She also has a history of prediabetes and obesity.  In the past she was on Wegovy  but has not taken this since around December 2024.  She would like to get back on this medication to help lose weight.  Does try and eat healthy and stay active. Wt Readings from Last 10 Encounters:  12/14/23 244 lb (110.7 kg)  11/11/23 246 lb 3.2 oz (111.7 kg)  10/14/23 245 lb 12.8 oz (111.5 kg)  08/26/23 240 lb 6 oz (109 kg)  05/05/23 242 lb (109.8 kg)  04/30/23 242 lb (109.8 kg)  04/20/23 237 lb (107.5 kg)  03/31/23 237 lb (107.5 kg)  03/30/23 237 lb (107.5 kg)  02/23/23 239 lb (108.4 kg)   Lab Results  Component Value Date   HGBA1C 5.9 (A) 03/31/2023   HGBA1C 6.4 04/01/2022   HGBA1C 6.3 (H) 10/16/2021     Review of Systems See HPI   Past Medical History:  Diagnosis Date   Arthritis    BMI 39.0-39.9,adult    Diabetes (HCC)    on Wegovy    Endometrial cancer (HCC) 09/2021   sx and radiation   Family history of pancreatic cancer    Family history of peritoneal cancer    History of radiation therapy    Endometrium- HDR 12/16/21-01/15/20- Dr. Lynwood Nasuti   Hypertension    PONV  (postoperative nausea and vomiting)    Sleep apnea    C PAP    Social History   Socioeconomic History   Marital status: Married    Spouse name: Not on file   Number of children: Not on file   Years of education: Not on file   Highest education level: Not on file  Occupational History   Occupation: retired Magazine features editor: RETIRED  Tobacco Use   Smoking status: Never   Smokeless tobacco: Never  Vaping Use   Vaping status: Never Used  Substance and Sexual Activity   Alcohol use: Yes    Alcohol/week: 5.0 standard drinks of alcohol    Types: 5 Standard drinks or equivalent per week    Comment: occas   Drug use: No   Sexual activity: Not Currently    Birth control/protection: Post-menopausal    Comment: 1st intercourse 73 yo.---Fewer than 5 partners  Other Topics Concern   Not on file  Social History Narrative   Retired from being a Engineer, site.    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 years    0 children    Social Drivers of Corporate investment banker Strain: Low Risk  (01/22/2023)   Overall Financial Resource Strain (CARDIA)    Difficulty of Paying Living Expenses: Not hard at all  Food Insecurity:  No Food Insecurity (01/22/2023)   Hunger Vital Sign    Worried About Running Out of Food in the Last Year: Never true    Ran Out of Food in the Last Year: Never true  Transportation Needs: No Transportation Needs (01/22/2023)   PRAPARE - Administrator, Civil Service (Medical): No    Lack of Transportation (Non-Medical): No  Physical Activity: Insufficiently Active (01/22/2023)   Exercise Vital Sign    Days of Exercise per Week: 3 days    Minutes of Exercise per Session: 30 min  Stress: No Stress Concern Present (01/22/2023)   [employer] of Occupational Health - Occupational Stress Questionnaire    Feeling of Stress : Not at all  Social Connections: Socially Integrated (01/22/2023)   Social Connection and Isolation Panel    Frequency of Communication with Friends and Family:  More than three times a week    Frequency of Social Gatherings with Friends and Family: More than three times a week    Attends Religious Services: More than 4 times per year    Active Member of Clubs or Organizations: Yes    Attends Banker Meetings: More than 4 times per year    Marital Status: Married  Catering manager Violence: Not At Risk (01/22/2023)   Humiliation, Afraid, Rape, and Kick questionnaire    Fear of Current or Ex-Partner: No    Emotionally Abused: No    Physically Abused: No    Sexually Abused: No    Past Surgical History:  Procedure Laterality Date   CHOLECYSTECTOMY  05/18/2002   COLONOSCOPY  02/24/2013   DB-MAC-moviprep (exc)-normal-10 yr recall   LYMPH NODE BIOPSY N/A 10/22/2021   Procedure: Sentinel LYMPH NODE BIOPSY;  Surgeon: Viktoria Comer SAUNDERS, MD;  Location: [facility];  Service: Gynecology;  Laterality: N/A;   ROBOTIC ASSISTED TOTAL HYSTERECTOMY WITH BILATERAL SALPINGO OOPHERECTOMY Bilateral 10/22/2021   Procedure: XI ROBOTIC ASSISTED TOTAL HYSTERECTOMY WITH BILATERAL SALPINGO OOPHORECTOMY;  Surgeon: Viktoria Comer SAUNDERS, MD;  Location: [facility];  Service: Gynecology;  Laterality: Bilateral;    Family History  Problem Relation Age of Onset   Cancer Mother 72       primary peritoneal cancer   Alzheimer's disease Mother    Heart disease Father    Pancreatic cancer Father 40   Hypertension Brother    Heart attack Brother 30       MI x 2   Breast cancer Paternal Aunt 31   Diabetes Paternal Grandmother    Colon cancer Neg Hx    Prostate cancer Neg Hx    Ovarian cancer Neg Hx    Colon polyps Neg Hx    Esophageal cancer Neg Hx    Rectal cancer Neg Hx    Stomach cancer Neg Hx     No Known Allergies  Current Outpatient Medications on File Prior to Visit  Medication Sig Dispense Refill   acetaminophen  (TYLENOL ) 500 MG tablet Take 500-1,000 mg by mouth every 6 (six) hours as needed (pain.).     aspirin EC 81 MG tablet Take 81 mg by mouth in the  morning.     Cholecalciferol (VITAMIN D3 PO) Take 1 tablet by mouth daily.     Continuous Glucose Sensor (FREESTYLE LIBRE 3 SENSOR) MISC PLACE 1 SENSOR ON THE SKIN EVERY 14 DAYS TO CHECK GLUCOSE CONTINUOUSLY 2 each 6   ibuprofen  (ADVIL ) 600 MG tablet Take 1 tablet (600 mg total) by mouth every 6 (six) hours as needed for moderate pain. For  AFTER surgery only 30 tablet 0   losartan -hydrochlorothiazide  (HYZAAR) 50-12.5 MG tablet TAKE 1 TABLET BY MOUTH DAILY 90 tablet 1   Semaglutide -Weight Management (WEGOVY ) 1 MG/0.5ML SOAJ Inject 1 mg into the skin once a week. 6 mL 0   No current facility-administered medications on file prior to visit.    BP 120/70   Pulse 62   Temp 98.2 F (36.8 C) (Oral)   Ht 5' 4.75 (1.645 m)   Wt 244 lb (110.7 kg)   SpO2 97%   BMI 40.92 kg/m       Objective:   Physical Exam Vitals and nursing note reviewed.  Constitutional:      Appearance: Normal appearance. She is obese.  Cardiovascular:     Rate and Rhythm: Normal rate and regular rhythm.     Pulses: Normal pulses.     Heart sounds: Normal heart sounds.  Pulmonary:     Breath sounds: Normal breath sounds.  Musculoskeletal:        General: Normal range of motion.  Skin:    General: Skin is warm and dry.  Neurological:     General: No focal deficit present.     Mental Status: She is alert and oriented to person, place, and time.  Psychiatric:        Mood and Affect: Mood normal.        Behavior: Behavior normal.        Thought Content: Thought content normal.        Judgment: Judgment normal.       Assessment & Plan:  1. Obesity, Class II, BMI 35-39.9 (Primary) - Will check BMP and A1c today.  If she continues to be prediabetic-we can place her back on Wegovy .  Will likely have her follow-up in 1 month to see how it is going after she starts her medication. - Basic Metabolic Panel; Future - Hemoglobin A1c; Future - Hemoglobin A1c - Basic Metabolic Panel  2. Pre-diabetes  - Basic  Metabolic Panel; Future - Hemoglobin A1c; Future - Hemoglobin A1c - Basic Metabolic Panel  3. Chronic right hip pain  - Ambulatory referral to Physical Therapy  Earnest Thalman, NP

## 2023-12-15 ENCOUNTER — Other Ambulatory Visit (HOSPITAL_BASED_OUTPATIENT_CLINIC_OR_DEPARTMENT_OTHER): Payer: Self-pay

## 2023-12-21 MED ORDER — FREESTYLE LIBRE 3 PLUS SENSOR MISC: 2 refills | Status: DC

## 2023-12-23 DIAGNOSIS — G4733 Obstructive sleep apnea (adult) (pediatric): Secondary | ICD-10-CM | POA: Diagnosis not present

## 2023-12-29 NOTE — Therapy (Addendum)
 OUTPATIENT PHYSICAL THERAPY LOWER EXTREMITY EVALUATION   Patient Name: Kylie Davis MRN: [id number] DOB:30-Jan-1951, 73 y.o., female Today's Date: 12/30/2023  END OF SESSION:  PT End of Session - 12/30/23 1250     Visit Number 1    Number of Visits 16    Date for PT Re-Evaluation 02/24/24    Authorization Type [employer] gelene req)    Progress Note Due on Visit 10    PT Start Time 1105    PT Stop Time 1146    PT Time Calculation (min) 41 min    Activity Tolerance Patient tolerated treatment well    Behavior During Therapy [facility] for tasks assessed/performed          Past Medical History:  Diagnosis Date   Arthritis    BMI 39.0-39.9,adult    Diabetes (HCC)    on Wegovy    Endometrial cancer (HCC) 09/2021   sx and radiation   Family history of pancreatic cancer    Family history of peritoneal cancer    History of radiation therapy    Endometrium- HDR 12/16/21-01/15/20- Dr. Lynwood Nasuti   Hypertension    PONV (postoperative nausea and vomiting)    Sleep apnea    C PAP   Past Surgical History:  Procedure Laterality Date   CHOLECYSTECTOMY  05/18/2002   COLONOSCOPY  02/24/2013   DB-MAC-moviprep (exc)-normal-10 yr recall   LYMPH NODE BIOPSY N/A 10/22/2021   Procedure: Sentinel LYMPH NODE BIOPSY;  Surgeon: Viktoria Comer SAUNDERS, MD;  Location: [facility];  Service: Gynecology;  Laterality: N/A;   ROBOTIC ASSISTED TOTAL HYSTERECTOMY WITH BILATERAL SALPINGO OOPHERECTOMY Bilateral 10/22/2021   Procedure: XI ROBOTIC ASSISTED TOTAL HYSTERECTOMY WITH BILATERAL SALPINGO OOPHORECTOMY;  Surgeon: Viktoria Comer SAUNDERS, MD;  Location: [facility];  Service: Gynecology;  Laterality: Bilateral;   Patient Active Problem List   Diagnosis Date Noted   Genetic testing 01/02/2022   Family history of peritoneal cancer 12/10/2021   Family history of pancreatic cancer 12/10/2021   Body mass index (BMI) 40.0-44.9, adult (HCC) 10/22/2021   Endometrial cancer (HCC) 10/06/2021   Obesity, Class II,  BMI 35-39.9 09/10/2015   Family history of malignant neoplasm of ovary 01/18/2014   Family history of malignant neoplasm of breast 01/18/2014   Family history of malignant neoplasm of gastrointestinal tract 01/18/2014   OSA (obstructive sleep apnea) 10/10/2013   Sleep apnea 09/13/2013   Allergic rhinitis 09/06/2012   Essential hypertension, benign 09/06/2012    PCP: Merna Huxley, NP  REFERRING PROVIDER: Merna Huxley, NP  REFERRING DIAG: [phone] (ICD-10-CM) - Chronic right hip pain  THERAPY DIAG:  Pain in right hip  Muscle weakness (generalized)  Difficulty in walking, not elsewhere classified  Other low back pain  Rationale for Evaluation and Treatment: Rehabilitation  ONSET DATE: Chronic and it has progressively gotten worse  SUBJECTIVE:   SUBJECTIVE STATEMENT: Patient presents with chronic right hip pain that has progressively gotten worse. She reports having back pain and she has always associated the two. She went to the chiropractor and she feel that has helped her alignment. Chiropractor recommended she got to her PCP to figure out what is doing on. She reports MD said she is bone on bone but she is not a candidate for a hip replacement. She is not able to bend to put her right sock on.  PERTINENT HISTORY: Diabetes; OA; HTN PAIN:  Are you having pain? Yes: NPRS scale: 1-2(currently) 4-5(worst)/10 Pain location: right groin (mainly) ; some lateral hip but not as much Pain  description: sharp Aggravating factors: any movements with right leg; putting on socks; walking > 25-30 mins; bend to pick up an object  Relieving factors: medication  PRECAUTIONS: None  RED FLAGS: None   WEIGHT BEARING RESTRICTIONS: No  FALLS:  Has patient fallen in last 6 months? 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  DIAGNOSTIC FINDINGS: 09/01/23 Right Hip MRI IMPRESSION: 1. Moderate osteoarthritis of the right hip. Right labral degeneration and anterosuperior labral tear. 2. Mild tendinosis of the right hamstring origin. 3. Severe disc height loss at L5-S1. 4. Mild osteoarthritis of bilateral SI joints.  PATIENT SURVEYS:  LEFS: 38/80 47.5%  COGNITION: Overall cognitive status: Within functional limits for tasks assessed     SENSATION: WFL   MUSCLE LENGTH: Hamstrings: decreased bilateral    POSTURE: rounded shoulders and forward head    LOWER EXTREMITY ROM: limited right hip IR and abduction; unable to achieve FABER position on Rt ; LEFT hip ROM WFL   LOWER EXTREMITY MMT: Grossly 4- to 4 /5 bilateral    FUNCTIONAL TESTS:  5 times sit to stand: 13.21 sec no UE support ( no hip pain) Timed up and go (TUG): 8.88 sec  GAIT:  Comments: No notable impairments                                                                                                                                TREATMENT DATE:  12/30/2023 Initial Evaluation & HEP created Educated patient on healthy sleep patterns and potential ways to improve sleep  PATIENT EDUCATION:  Education details: PT eval findings, anticipated POC, progress with PT, and initial HEP Person educated: Patient Education method: Explanation, Demonstration, and Handouts Education comprehension: verbalized understanding, returned demonstration, and needs further education  HOME EXERCISE PROGRAM: Access Code: [email or web address] URL: [email or web address] Date:  12/30/2023 Prepared by: Kristeen Sar  Exercises - Supine Hip Internal and External Rotation  - 1 x daily - 7 x weekly - 1 sets - 10 reps - 4 hold - Seated Hamstring Stretch  - 1 x daily - 7 x weekly - 2 sets - 20-30s hold - Active Straight Leg Raise with Quad Set  - 1 x daily - 7 x weekly - 2 sets - 10 reps - Seated Hip Abduction with Resistance  - 1 x daily - 7 x weekly - 2 sets - 10 reps  ASSESSMENT:  CLINICAL IMPRESSION: Patient is a 73 y.o. female who was seen today for physical therapy evaluation and  treatment for chronic right hip pain. Daneka presents with chronic hip pain that has progressively gotten worse over the years. She reports her doctor said that she is not a candidate for a hip replacement right now due to her weight. Based on evaluation noted hip weakness, decreased hip ROM, and poor fitness. Patient does not feel her hip pain interferes with her sleep, but she tosses and turns throughout the night. Discussed healthy sleep patterns and reviewed different cycles of sleep. Patient is motivated and wants to get better. Patient will benefit from skilled PT to address the below impairments and improve overall function.   OBJECTIVE IMPAIRMENTS: decreased activity tolerance, difficulty walking, decreased ROM, decreased strength, hypomobility, increased muscle spasms, impaired flexibility, improper body mechanics, and pain.   ACTIVITY LIMITATIONS: lifting, bending, squatting, stairs, bed mobility, bathing, dressing, hygiene/grooming, and locomotion level  PARTICIPATION LIMITATIONS: meal prep, cleaning, laundry, shopping, and community activity  PERSONAL FACTORS: Age, Fitness, and 3+ comorbidities: Diabetes; OA; HTN are also affecting patient's functional outcome.   REHAB POTENTIAL: Good  CLINICAL DECISION MAKING: Stable/uncomplicated  EVALUATION COMPLEXITY: Low   GOALS: Goals reviewed with patient? Yes  SHORT TERM GOALS: Target date: 01/27/2024  Patient will be independent  with initial HEP. Baseline:  Goal status: INITIAL  2.  Patient will report > or = to 30% improvement in hip pain since starting PT. Baseline:  Goal status: INITIAL  3.  Patient will demonstrate correct technique when bending/ stooping to pick up an object to prevent mechanical strain and injury. Baseline:  Goal status: INITIAL   LONG TERM GOALS: Target date: 02/23/2024  Patient will demonstrate independence in advanced HEP. Baseline:  Goal status: INITIAL  2.  Patient will report > or = to 70% improvement in right hip pain since starting PT. Baseline:  Goal status: INITIAL  3.  Patient will verbalize and demonstrate self-care strategies to manage pain including tissue mobility practices and change of position. Baseline:  Goal status: INITIAL  4.  Patient will be able to bend and put on her socks with < or = to 2/10 hip pain. Baseline: unable Goal status: INITIAL  5.  Patient will score > or = to 50/80 on LEFS due to improved function and mobility. Baseline:  Goal status: INITIAL  6.  Patient will score < or = to 10 sec on 5STS due to improved strength of hip musculature. Baseline: 13.21sec Goal status: INITIAL  6.  Patient will be able to participate in at least 30 mins of walking with no hip pain to establish regular routine to improve cardiovascular endurance. Baseline:  Goal status: INITIAL   PLAN:  PT FREQUENCY: 1-2x/week  PT DURATION: 8 weeks  PLANNED INTERVENTIONS: 97164- PT Re-evaluation, 97110-Therapeutic exercises, 97530- Therapeutic activity, 97112- Neuromuscular re-education, 97535- Self Care, 02859- Manual therapy, [phone]- Gait training, [phone]- Canalith repositioning, [id number]- Aquatic Therapy, [phone]- Electrical stimulation (unattended), [phone]- Electrical stimulation (manual), [id number]- Ultrasound, [id number]- Traction (mechanical), [id number]- Ionotophoresis 4mg /ml Dexamethasone , 79439 (1-2 muscles), 20561 (3+ muscles)- Dry Needling, Patient/Family education, Balance  training, Stair training, Taping, Joint mobilization, Joint manipulation, Spinal manipulation, Spinal mobilization, Vestibular training, Cryotherapy, and Moist heat  PLAN FOR NEXT SESSION: auth requested (16 visits)  Review HEP; NuStep; glute strengthening; hip flexibility   Kristeen Sar, PT 12/30/23 12:51 PM [facility] [address] [place], [place] [address] Phone # [phone] Fax [phone]

## 2023-12-30 ENCOUNTER — Ambulatory Visit: Attending: Adult Health | Admitting: Physical Therapy

## 2023-12-30 ENCOUNTER — Other Ambulatory Visit: Payer: Self-pay

## 2023-12-30 ENCOUNTER — Encounter: Payer: Self-pay | Admitting: Physical Therapy

## 2023-12-30 DIAGNOSIS — M6281 Muscle weakness (generalized): Secondary | ICD-10-CM | POA: Insufficient documentation

## 2023-12-30 DIAGNOSIS — G8929 Other chronic pain: Secondary | ICD-10-CM | POA: Diagnosis not present

## 2023-12-30 DIAGNOSIS — M5459 Other low back pain: Secondary | ICD-10-CM | POA: Diagnosis not present

## 2023-12-30 DIAGNOSIS — R262 Difficulty in walking, not elsewhere classified: Secondary | ICD-10-CM | POA: Insufficient documentation

## 2023-12-30 DIAGNOSIS — M25551 Pain in right hip: Secondary | ICD-10-CM | POA: Insufficient documentation

## 2023-12-30 NOTE — Patient Instructions (Signed)
 [facility]  Welcome to [facility]! Here you will find all the information you will need regarding your pool therapy. If you have further questions at any time, please call our office at [phone]. After completing your initial evaluation in the [facility], you may be eligible to complete a portion of your therapy in the pool. A typical week of therapy will consist of 1-2 typical physical therapy visits at our [facility] location and an additional session of therapy in the pool located at the [facility] at [facility]. [address], [place] [address]. The phone number at the pool site is [phone]. Please call this number if you are running late or need to cancel your appointment.  Check-in on MyChart then meet your therapist at the pool deck. (If you can't access MyChart, you may check in with the therapist at the pool deck.)   Each session will last approximately 45 minutes. All scheduling and payments for aquatic therapy sessions, including cancelations, will be done through our [facility] location.  To be eligible for aquatic therapy, these criteria must be met: You must be able to independently change in the locker room and get to the pool deck. A caregiver can come with you to help if needed however they do need to be the same sex to enter the locker room. Or you may change in a bathroom privately with opposite sex caregiver if needed. There are benches for a caregiver to sit on next to the pool.  Handicap parking is available in the front and there is a drop off option for even closer accessibility.  Please arrive 15 minutes prior to your appointment to prepare for your pool session. You must sign in at the front desk upon your arrival. Please be sure to attend to any toileting needs prior to entering the pool. Locker rooms for changing are available.  There is direct access to the pool deck from the locker  room. You can lock your belongings in a locker or bring them with you poolside. Your therapist will greet you on the pool deck. There may be other swimmers in the pool at the same time but your session is one-on-one with the therapist.    What to Expect Arrive 15 min early for your appointment and check in with rehab front desk. Please limit use of body lotions and hair products before entering the pool. Locker rooms are available for showering, changing and toileting. Appointments are 45 minutes with your therapist. (This does not include changing times) The pool is approximately 500 feet from the nearest parking lot. There are benches and chairs along the walk. Please bring a support person if you need assistance traveling the distanceto the pool or assistance with changing/toileting. Stairs with handrails as well as a lift chair are available at the pool.  Depth is 3'6"-4'8" and temperature is between 88-90 degrees. The pool deck is tile flooring and gets slippery, water  shoes are strongly encouraged but not required. Please wear a bathing suit or athletic shorts and a t-shirt. Recommended to bring your own towel. Severe weather:Thunder or lightning results in closure of the pool deck for 30 minutes and is extended with each incidence. Your appointment may be moved to land or canceled with the option to reschedule. Tell your therapist if you have any of the following: Open wounds Active infection Fear of water  Bowel or bladder incontinence  Benefits of Aquatic Therapy:  Reduces Stress on Joints  and Muscles  The buoyancy of water  supports body weight, making movement easier and less painful.  Builds Strength and Stability  The viscosity of water  provides resistance that allows individuals to strengthen muscles while also providing a safe environment to improve balance and coordination.  Promotes Relaxation  The warm water  and the feeling of being supported can help reduce stress and be  beneficial for overall well-being.

## 2024-01-05 ENCOUNTER — Ambulatory Visit

## 2024-01-05 DIAGNOSIS — M5459 Other low back pain: Secondary | ICD-10-CM | POA: Diagnosis not present

## 2024-01-05 DIAGNOSIS — R262 Difficulty in walking, not elsewhere classified: Secondary | ICD-10-CM | POA: Diagnosis not present

## 2024-01-05 DIAGNOSIS — M6281 Muscle weakness (generalized): Secondary | ICD-10-CM

## 2024-01-05 DIAGNOSIS — G8929 Other chronic pain: Secondary | ICD-10-CM | POA: Diagnosis not present

## 2024-01-05 DIAGNOSIS — M25551 Pain in right hip: Secondary | ICD-10-CM

## 2024-01-05 NOTE — Therapy (Signed)
 OUTPATIENT PHYSICAL THERAPY TREATMENT   Patient Name: Kylie Davis MRN: [id number] DOB:Sep 16, 1950, 73 y.o., female Today's Date: 01/05/2024  END OF SESSION:  PT End of Session - 01/05/24 0939     Visit Number 2    Date for PT Re-Evaluation 02/24/24    Authorization Type [employer]: 16 visits 8/14-10/14/25    Progress Note Due on Visit 10    PT Start Time 0847    PT Stop Time 0931    PT Time Calculation (min) 44 min    Activity Tolerance Patient tolerated treatment well    Behavior During Therapy [facility] for tasks assessed/performed           Past Medical History:  Diagnosis Date   Arthritis    BMI 39.0-39.9,adult    Diabetes (HCC)    on Wegovy    Endometrial cancer (HCC) 09/2021   sx and radiation   Family history of pancreatic cancer    Family history of peritoneal cancer    History of radiation therapy    Endometrium- HDR 12/16/21-01/15/20- Dr. Lynwood Nasuti   Hypertension    PONV (postoperative nausea and vomiting)    Sleep apnea    C PAP   Past Surgical History:  Procedure Laterality Date   CHOLECYSTECTOMY  05/18/2002   COLONOSCOPY  02/24/2013   DB-MAC-moviprep (exc)-normal-10 yr recall   LYMPH NODE BIOPSY N/A 10/22/2021   Procedure: Sentinel LYMPH NODE BIOPSY;  Surgeon: Viktoria Comer SAUNDERS, MD;  Location: [facility];  Service: Gynecology;  Laterality: N/A;   ROBOTIC ASSISTED TOTAL HYSTERECTOMY WITH BILATERAL SALPINGO OOPHERECTOMY Bilateral 10/22/2021   Procedure: XI ROBOTIC ASSISTED TOTAL HYSTERECTOMY WITH BILATERAL SALPINGO OOPHORECTOMY;  Surgeon: Viktoria Comer SAUNDERS, MD;  Location: [facility];  Service: Gynecology;  Laterality: Bilateral;   Patient Active Problem List   Diagnosis Date Noted   Genetic testing 01/02/2022   Family history of peritoneal cancer 12/10/2021   Family history of pancreatic cancer 12/10/2021   Body mass index (BMI) 40.0-44.9, adult (HCC) 10/22/2021   Endometrial cancer (HCC) 10/06/2021   Obesity, Class II, BMI 35-39.9 09/10/2015   Family  history of malignant neoplasm of ovary 01/18/2014   Family history of malignant neoplasm of breast 01/18/2014   Family history of malignant neoplasm of gastrointestinal tract 01/18/2014   OSA (obstructive sleep apnea) 10/10/2013   Sleep apnea 09/13/2013   Allergic rhinitis 09/06/2012   Essential hypertension, benign 09/06/2012    PCP: Merna Huxley, NP  REFERRING PROVIDER: Merna Huxley, NP  REFERRING DIAG: [phone] (ICD-10-CM) - Chronic right hip pain  THERAPY DIAG:  Pain in right hip  Muscle weakness (generalized)  Difficulty in walking, not elsewhere classified  Other low back pain  Rationale for Evaluation and Treatment: Rehabilitation  ONSET DATE: Chronic and it has progressively gotten worse  SUBJECTIVE:   SUBJECTIVE STATEMENT: I have questions about a couple of exercises.  I'm doing them at home.   PERTINENT HISTORY: Diabetes; OA; HTN PAIN:  Are you having pain? Yes: NPRS scale: 1-2(currently) 4-5(worst)/10 Pain location: right groin (mainly) ; some lateral hip but not as much Pain description: sharp Aggravating factors: any movements with right leg; putting on socks; walking > 25-30 mins; bend to pick up an object  Relieving factors: medication  PRECAUTIONS: None  RED FLAGS: None   WEIGHT BEARING RESTRICTIONS: No  FALLS:  Has patient fallen in last 6 months? No  LIVING ENVIRONMENT: Lives with: lives with their spouse Lives in: House/apartment Stairs: No Has following equipment at home: None  OCCUPATION: Retired  PLOF:  Independent, Independent with basic ADLs, Independent with household mobility without device, Independent with community mobility without device, Independent with gait, Independent with transfers, and Leisure: Boating to [employer];   PATIENT GOALS: To be able to put my sock on  NEXT MD VISIT: next week  OBJECTIVE:  Note: Objective measures were completed at Evaluation unless otherwise noted.  DIAGNOSTIC FINDINGS:  09/01/23 Right Hip MRI IMPRESSION: 1. Moderate osteoarthritis of the right hip. Right labral degeneration and anterosuperior labral tear. 2. Mild tendinosis of the right hamstring origin. 3. Severe disc height loss at L5-S1. 4. Mild osteoarthritis of bilateral SI joints.  PATIENT SURVEYS:  LEFS: 38/80 47.5%  COGNITION: Overall cognitive status: Within functional limits for tasks assessed     SENSATION: WFL   MUSCLE LENGTH: Hamstrings: decreased bilateral    POSTURE: rounded shoulders and forward head    LOWER EXTREMITY ROM: limited right hip IR and abduction; unable to achieve FABER position on Rt ; LEFT hip ROM WFL   LOWER EXTREMITY MMT: Grossly 4- to 4 /5 bilateral    FUNCTIONAL TESTS:  5 times sit to stand: 13.21 sec no UE support ( no hip pain) Timed up and go (TUG): 8.88 sec  GAIT:  Comments: No notable impairments                                                                                                                                TREATMENT DATE:   01/05/2024 NuStep: Level 5x 6 minutes-PT present to discuss progress Seated hamstring stretch 3x20 seconds  Seated hip abduction with green band 2x10 Supine hip IR/ER x10 SLR with quad set x10 Rt and Lt Supine ball squeeze with TA activation 5 hold x20  Weight shifting 3 ways on balance pad x1 min each  Sit to stand 2x10  Bil heel raises: 2x10  12/30/2023 Initial Evaluation & HEP created Educated patient on healthy sleep patterns and potential ways to improve sleep  PATIENT EDUCATION:  Education details: PT eval findings, anticipated POC, progress with PT, and initial HEP Person educated: Patient Education method: Explanation, Demonstration, and Handouts Education comprehension: verbalized understanding, returned demonstration, and needs further education  HOME EXERCISE PROGRAM: Access Code: [email or web address] URL: [email or web address] Date: 01/05/2024 Prepared by: Burnard  Exercises -  Supine Hip Internal and External Rotation  - 1 x daily - 7 x weekly - 1 sets - 10 reps - 4 hold - Seated Hamstring Stretch  - 1 x daily - 7 x weekly - 2 sets - 20-30s hold - Active Straight Leg Raise with Quad Set  - 1 x daily - 7 x weekly - 2 sets - 10 reps - Seated Hip Abduction with Resistance  - 1 x daily - 7 x weekly - 2 sets - 10 reps - Supine Hip Adduction Isometric with Ball  - 1 x daily - 7 x weekly - 3 sets - 10 reps - Seated Transversus Abdominis Bracing  -  1 x daily - 7 x weekly - 3 sets - 10 reps - Sit to Stand Without Arm Support  - 2 x daily - 7 x weekly - 2 sets - 10 reps - Supine Hip External Rotation AAROM  - 2-3 x daily - 7 x weekly - 1 sets - 3 reps - 20 hold ASSESSMENT:  CLINICAL IMPRESSION: Pt with first time follow-up after evaluation.   Patient is motivated and wants to get better. Patient will benefit from skilled PT to address the below impairments and improve overall function.   OBJECTIVE IMPAIRMENTS: decreased activity tolerance, difficulty walking, decreased ROM, decreased strength, hypomobility, increased muscle spasms, impaired flexibility, improper body mechanics, and pain.   ACTIVITY LIMITATIONS: lifting, bending, squatting, stairs, bed mobility, bathing, dressing, hygiene/grooming, and locomotion level  PARTICIPATION LIMITATIONS: meal prep, cleaning, laundry, shopping, and community activity  PERSONAL FACTORS: Age, Fitness, and 3+ comorbidities: Diabetes; OA; HTN are also affecting patient's functional outcome.   REHAB POTENTIAL: Good  CLINICAL DECISION MAKING: Stable/uncomplicated  EVALUATION COMPLEXITY: Low   GOALS: Goals reviewed with patient? Yes  SHORT TERM GOALS: Target date: 01/27/2024  Patient will be independent with initial HEP. Baseline:  Goal status: INITIAL  2.  Patient will report > or = to 30% improvement in hip pain since starting PT. Baseline:  Goal status: INITIAL  3.  Patient will demonstrate correct technique when bending/  stooping to pick up an object to prevent mechanical strain and injury. Baseline:  Goal status: INITIAL   LONG TERM GOALS: Target date: 02/23/2024  Patient will demonstrate independence in advanced HEP. Baseline:  Goal status: INITIAL  2.  Patient will report > or = to 70% improvement in right hip pain since starting PT. Baseline:  Goal status: INITIAL  3.  Patient will verbalize and demonstrate self-care strategies to manage pain including tissue mobility practices and change of position. Baseline:  Goal status: INITIAL  4.  Patient will be able to bend and put on her socks with < or = to 2/10 hip pain. Baseline: unable Goal status: INITIAL  5.  Patient will score > or = to 50/80 on LEFS due to improved function and mobility. Baseline:  Goal status: INITIAL  6.  Patient will score < or = to 10 sec on 5STS due to improved strength of hip musculature. Baseline: 13.21sec Goal status: INITIAL  6.  Patient will be able to participate in at least 30 mins of walking with no hip pain to establish regular routine to improve cardiovascular endurance. Baseline:  Goal status: INITIAL   PLAN:  PT FREQUENCY: 1-2x/week  PT DURATION: 8 weeks  PLANNED INTERVENTIONS: 97164- PT Re-evaluation, 97110-Therapeutic exercises, 97530- Therapeutic activity, 97112- Neuromuscular re-education, 97535- Self Care, 02859- Manual therapy, [phone]- Gait training, [phone]- Canalith repositioning, [id number]- Aquatic Therapy, [phone]- Electrical stimulation (unattended), [phone]- Electrical stimulation (manual), [id number]- Ultrasound, [id number]- Traction (mechanical), [id number]- Ionotophoresis 4mg /ml Dexamethasone , 20560 (1-2 muscles), 20561 (3+ muscles)- Dry Needling, Patient/Family education, Balance training, Stair training, Taping, Joint mobilization, Joint manipulation, Spinal manipulation, Spinal mobilization, Vestibular training, Cryotherapy, and Moist heat  PLAN FOR NEXT SESSION:  Review HEP; NuStep; glute strengthening; hip  flexibility   Burnard Joy, PT 01/05/24 10:37 AM  [facility] [address] [place], [place] [address] Phone # [phone] Fax [phone]

## 2024-01-12 ENCOUNTER — Ambulatory Visit

## 2024-01-12 ENCOUNTER — Encounter: Payer: Self-pay | Admitting: Adult Health

## 2024-01-12 ENCOUNTER — Ambulatory Visit: Admitting: Adult Health

## 2024-01-12 ENCOUNTER — Other Ambulatory Visit (HOSPITAL_BASED_OUTPATIENT_CLINIC_OR_DEPARTMENT_OTHER): Payer: Self-pay

## 2024-01-12 VITALS — BP 130/70 | HR 72 | Temp 98.4°F | Ht 64.75 in | Wt 240.0 lb

## 2024-01-12 DIAGNOSIS — M25551 Pain in right hip: Secondary | ICD-10-CM

## 2024-01-12 DIAGNOSIS — R262 Difficulty in walking, not elsewhere classified: Secondary | ICD-10-CM

## 2024-01-12 DIAGNOSIS — M5459 Other low back pain: Secondary | ICD-10-CM

## 2024-01-12 DIAGNOSIS — R7303 Prediabetes: Secondary | ICD-10-CM

## 2024-01-12 DIAGNOSIS — E66812 Obesity, class 2: Secondary | ICD-10-CM

## 2024-01-12 DIAGNOSIS — M6281 Muscle weakness (generalized): Secondary | ICD-10-CM | POA: Diagnosis not present

## 2024-01-12 DIAGNOSIS — Z6835 Body mass index (BMI) 35.0-35.9, adult: Secondary | ICD-10-CM

## 2024-01-12 DIAGNOSIS — G8929 Other chronic pain: Secondary | ICD-10-CM | POA: Diagnosis not present

## 2024-01-12 MED ORDER — FREESTYLE LIBRE 3 PLUS SENSOR MISC
6 refills | Status: AC
Start: 2024-01-12 — End: ?

## 2024-01-12 MED ORDER — WEGOVY 0.5 MG/0.5ML ~~LOC~~ SOAJ
0.5000 mg | SUBCUTANEOUS | 0 refills | Status: AC
  Filled 2024-01-12 (×2): qty 2, 28d supply, fill #0

## 2024-01-12 NOTE — Therapy (Signed)
 OUTPATIENT PHYSICAL THERAPY TREATMENT   Patient Name: Kylie Davis MRN: [id number] DOB:05-20-1950, 73 y.o., female Today's Date: 01/12/2024  END OF SESSION:  PT End of Session - 01/12/24 0941     Visit Number 3    Date for PT Re-Evaluation 02/24/24    Authorization Type [employer]: 16 visits 8/14-10/14/25    Authorization - Visit Number 3    Authorization - Number of Visits 16    Progress Note Due on Visit 10    Activity Tolerance Patient tolerated treatment well    Behavior During Therapy [facility] for tasks assessed/performed            Past Medical History:  Diagnosis Date   Arthritis    BMI 39.0-39.9,adult    Diabetes (HCC)    on Wegovy    Endometrial cancer (HCC) 09/2021   sx and radiation   Family history of pancreatic cancer    Family history of peritoneal cancer    History of radiation therapy    Endometrium- HDR 12/16/21-01/15/20- Dr. Lynwood Nasuti   Hypertension    PONV (postoperative nausea and vomiting)    Sleep apnea    C PAP   Past Surgical History:  Procedure Laterality Date   CHOLECYSTECTOMY  05/18/2002   COLONOSCOPY  02/24/2013   DB-MAC-moviprep (exc)-normal-10 yr recall   LYMPH NODE BIOPSY N/A 10/22/2021   Procedure: Sentinel LYMPH NODE BIOPSY;  Surgeon: Viktoria Comer SAUNDERS, MD;  Location: [facility];  Service: Gynecology;  Laterality: N/A;   ROBOTIC ASSISTED TOTAL HYSTERECTOMY WITH BILATERAL SALPINGO OOPHERECTOMY Bilateral 10/22/2021   Procedure: XI ROBOTIC ASSISTED TOTAL HYSTERECTOMY WITH BILATERAL SALPINGO OOPHORECTOMY;  Surgeon: Viktoria Comer SAUNDERS, MD;  Location: [facility];  Service: Gynecology;  Laterality: Bilateral;   Patient Active Problem List   Diagnosis Date Noted   Genetic testing 01/02/2022   Family history of peritoneal cancer 12/10/2021   Family history of pancreatic cancer 12/10/2021   Body mass index (BMI) 40.0-44.9, adult (HCC) 10/22/2021   Endometrial cancer (HCC) 10/06/2021   Obesity, Class II, BMI 35-39.9 09/10/2015   Family  history of malignant neoplasm of ovary 01/18/2014   Family history of malignant neoplasm of breast 01/18/2014   Family history of malignant neoplasm of gastrointestinal tract 01/18/2014   OSA (obstructive sleep apnea) 10/10/2013   Sleep apnea 09/13/2013   Allergic rhinitis 09/06/2012   Essential hypertension, benign 09/06/2012    PCP: Merna Huxley, NP  REFERRING PROVIDER: Merna Huxley, NP  REFERRING DIAG: [phone] (ICD-10-CM) - Chronic right hip pain  THERAPY DIAG:  Pain in right hip  Muscle weakness (generalized)  Difficulty in walking, not elsewhere classified  Other low back pain  Rationale for Evaluation and Treatment: Rehabilitation  ONSET DATE: Chronic and it has progressively gotten worse  SUBJECTIVE:   SUBJECTIVE STATEMENT: My knees have been sore but I'm not sure what is causing it.   PERTINENT HISTORY: Diabetes; OA; HTN PAIN: 01/12/24 Are you having pain? Yes: NPRS scale: 1-2(currently)  Pain location: right groin (mainly) ; some lateral hip but not as much Pain description: sharp Aggravating factors: any movements with right leg; putting on socks; walking > 25-30 mins; bend to pick up an object  Relieving factors: medication  PRECAUTIONS: None  RED FLAGS: None   WEIGHT BEARING RESTRICTIONS: No  FALLS:  Has patient fallen in last 6 months? No  LIVING ENVIRONMENT: Lives with: lives with their spouse Lives in: House/apartment Stairs: No Has following equipment at home: None  OCCUPATION: Retired  PLOF: Independent, Independent with basic ADLs, Independent  with household mobility without device, Independent with community mobility without device, Independent with gait, Independent with transfers, and Leisure: Boating to [employer];   PATIENT GOALS: To be able to put my sock on  NEXT MD VISIT: next week  OBJECTIVE:  Note: Objective measures were completed at Evaluation unless otherwise noted.  DIAGNOSTIC FINDINGS: 09/01/23 Right Hip  MRI IMPRESSION: 1. Moderate osteoarthritis of the right hip. Right labral degeneration and anterosuperior labral tear. 2. Mild tendinosis of the right hamstring origin. 3. Severe disc height loss at L5-S1. 4. Mild osteoarthritis of bilateral SI joints.  PATIENT SURVEYS:  LEFS: 38/80 47.5%  COGNITION: Overall cognitive status: Within functional limits for tasks assessed     SENSATION: WFL   MUSCLE LENGTH: Hamstrings: decreased bilateral    POSTURE: rounded shoulders and forward head    LOWER EXTREMITY ROM: limited right hip IR and abduction; unable to achieve FABER position on Rt ; LEFT hip ROM WFL   LOWER EXTREMITY MMT: Grossly 4- to 4 /5 bilateral    FUNCTIONAL TESTS:  5 times sit to stand: 13.21 sec no UE support ( no hip pain) Timed up and go (TUG): 8.88 sec  GAIT:  Comments: No notable impairments                                                                                                                                TREATMENT DATE:  01/12/2024 NuStep: Level 5x 8 minutes-PT present to discuss progress Seated hamstring stretch 3x20 seconds, one set standing using power plate ER with 2# ankle weights seated 2x10, more challenge on Lt Long arc quads: 2# added 2x10 Seated hip abduction with green band 2x10 Standing on balance pad: bil hip abduction 2x10 Weight shifting 3 ways on balance pad x1 min each  Sit to stand 2x10 holding 5# kettlebell  Bil heel raises: 2x10 standing on foam pad  01/05/2024 NuStep: Level 5x 6 minutes-PT present to discuss progress Seated hamstring stretch 3x20 seconds  Seated hip abduction with green band 2x10 Supine hip IR/ER x10 SLR with quad set x10 Rt and Lt Supine ball squeeze with TA activation 5 hold x20  Weight shifting 3 ways on balance pad x1 min each  Sit to stand 2x10  Bil heel raises: 2x10  12/30/2023 Initial Evaluation & HEP created Educated patient on healthy sleep patterns and potential ways to improve  sleep  PATIENT EDUCATION:  Education details: PT eval findings, anticipated POC, progress with PT, and initial HEP Person educated: Patient Education method: Explanation, Demonstration, and Handouts Education comprehension: verbalized understanding, returned demonstration, and needs further education  HOME EXERCISE PROGRAM: Access Code: [email or web address] URL: [email or web address] Date: 01/05/2024 Prepared by: Burnard  Exercises - Supine Hip Internal and External Rotation  - 1 x daily - 7 x weekly - 1 sets - 10 reps - 4 hold - Seated Hamstring Stretch  - 1 x daily - 7 x weekly - 2 sets - 20-30s  hold - Active Straight Leg Raise with Quad Set  - 1 x daily - 7 x weekly - 2 sets - 10 reps - Seated Hip Abduction with Resistance  - 1 x daily - 7 x weekly - 2 sets - 10 reps - Supine Hip Adduction Isometric with Ball  - 1 x daily - 7 x weekly - 3 sets - 10 reps - Seated Transversus Abdominis Bracing  - 1 x daily - 7 x weekly - 3 sets - 10 reps - Sit to Stand Without Arm Support  - 2 x daily - 7 x weekly - 2 sets - 10 reps - Supine Hip External Rotation AAROM  - 2-3 x daily - 7 x weekly - 1 sets - 3 reps - 20 hold ASSESSMENT:  CLINICAL IMPRESSION:   Pt has been independent and compliant with HEP and pt reports some bil knee pain. Pt did well with advancement of activity today and denies any pain.  PT provided verbal cues for hip alignment with standing exercises. Overall reduced hip pain. Still with limited mobility with Lt hip ER. Patient will benefit from skilled PT to address the below impairments and improve overall function.   OBJECTIVE IMPAIRMENTS: decreased activity tolerance, difficulty walking, decreased ROM, decreased strength, hypomobility, increased muscle spasms, impaired flexibility, improper body mechanics, and pain.   ACTIVITY LIMITATIONS: lifting, bending, squatting, stairs, bed mobility, bathing, dressing, hygiene/grooming, and locomotion level  PARTICIPATION LIMITATIONS:  meal prep, cleaning, laundry, shopping, and community activity  PERSONAL FACTORS: Age, Fitness, and 3+ comorbidities: Diabetes; OA; HTN are also affecting patient's functional outcome.   REHAB POTENTIAL: Good  CLINICAL DECISION MAKING: Stable/uncomplicated  EVALUATION COMPLEXITY: Low   GOALS: Goals reviewed with patient? Yes  SHORT TERM GOALS: Target date: 01/27/2024  Patient will be independent with initial HEP. Baseline: independent in current program  Goal status: in progress   2.  Patient will report > or = to 30% improvement in hip pain since starting PT. Baseline: better overall but doesn't rate it (01/12/24) Goal status: In progress   3.  Patient will demonstrate correct technique when bending/ stooping to pick up an object to prevent mechanical strain and injury. Baseline:  Goal status: INITIAL   LONG TERM GOALS: Target date: 02/23/2024  Patient will demonstrate independence in advanced HEP. Baseline:  Goal status: INITIAL  2.  Patient will report > or = to 70% improvement in right hip pain since starting PT. Baseline:  Goal status: INITIAL  3.  Patient will verbalize and demonstrate self-care strategies to manage pain including tissue mobility practices and change of position. Baseline:  Goal status: INITIAL  4.  Patient will be able to bend and put on her socks with < or = to 2/10 hip pain. Baseline: unable Goal status: INITIAL  5.  Patient will score > or = to 50/80 on LEFS due to improved function and mobility. Baseline:  Goal status: INITIAL  6.  Patient will score < or = to 10 sec on 5STS due to improved strength of hip musculature. Baseline: 13.21sec Goal status: INITIAL  6.  Patient will be able to participate in at least 30 mins of walking with no hip pain to establish regular routine to improve cardiovascular endurance. Baseline:  Goal status: INITIAL   PLAN:  PT FREQUENCY: 1-2x/week  PT DURATION: 8 weeks  PLANNED INTERVENTIONS: [id number]-  PT Re-evaluation, 97110-Therapeutic exercises, 97530- Therapeutic activity, [id number]- Neuromuscular re-education, 97535- Self Care, [id number]- Manual therapy, [id number]- Gait training, [phone]- Canalith repositioning, [id number]-  Aquatic Therapy, [phone]- Electrical stimulation (unattended), [id number]- Electrical stimulation (manual), 02964- Ultrasound, [id number]- Traction (mechanical), 02966- Ionotophoresis 4mg /ml Dexamethasone , 79439 (1-2 muscles), [id number] (3+ muscles)- Dry Needling, Patient/Family education, Balance training, Stair training, Taping, Joint mobilization, Joint manipulation, Spinal manipulation, Spinal mobilization, Vestibular training, Cryotherapy, and Moist heat  PLAN FOR NEXT SESSION:  Review HEP; NuStep; glute strengthening; hip flexibility.  Add standing to HEP.   Burnard Joy, PT 01/12/24 9:42 AM  [facility] [address] [place], [place] [address] Phone # [phone] Fax [phone]

## 2024-01-12 NOTE — Progress Notes (Signed)
 Subjective:    Patient ID: Kylie Davis, female    DOB: 1950/07/15, 73 y.o.   MRN: [id number]  HPI 73 year old female who  has a past medical history of Arthritis, BMI 39.0-39.9,adult, Diabetes (HCC), Endometrial cancer (HCC) (09/2021), Family history of pancreatic cancer, Family history of peritoneal cancer, History of radiation therapy, Hypertension, PONV (postoperative nausea and vomiting), and Sleep apnea.  She also has a history of prediabetes and obesity.  In the past she was on Wegovy  but had not taken this since around December 2024.  One month ago we placed her back on Wegovy  at 0.25 mg weekly. She is tolerating this medication well with no side effects. She is eating better and trying to stay active.   She needs a refill of the [employer]. She pays out of pocket for this but finds it helpful to keep her on track  Wt Readings from Last 3 Encounters:  01/12/24 240 lb (108.9 kg)  12/14/23 244 lb (110.7 kg)  11/11/23 246 lb 3.2 oz (111.7 kg)   Lab Results  Component Value Date   HGBA1C 6.4 12/14/2023   HGBA1C 5.9 (A) 03/31/2023   HGBA1C 6.4 04/01/2022     Review of Systems See HPI   Past Medical History:  Diagnosis Date   Arthritis    BMI 39.0-39.9,adult    Diabetes (HCC)    on Wegovy    Endometrial cancer (HCC) 09/2021   sx and radiation   Family history of pancreatic cancer    Family history of peritoneal cancer    History of radiation therapy    Endometrium- HDR 12/16/21-01/15/20- Dr. Lynwood Nasuti   Hypertension    PONV (postoperative nausea and vomiting)    Sleep apnea    C PAP    Social History   Socioeconomic History   Marital status: Married    Spouse name: Not on file   Number of children: Not on file   Years of education: Not on file   Highest education level: Not on file  Occupational History   Occupation: retired Magazine features editor: RETIRED  Tobacco Use   Smoking status: Never   Smokeless tobacco: Never  Vaping Use   Vaping  status: Never Used  Substance and Sexual Activity   Alcohol use: Yes    Alcohol/week: 5.0 standard drinks of alcohol    Types: 5 Standard drinks or equivalent per week    Comment: occas   Drug use: No   Sexual activity: Not Currently    Birth control/protection: Post-menopausal    Comment: 1st intercourse 73 yo.---Fewer than 5 partners  Other Topics Concern   Not on file  Social History Narrative   Retired from being a Engineer, site.    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 years    0 children    Social Drivers of Corporate investment banker Strain: Low Risk  (01/22/2023)   Overall Financial Resource Strain (CARDIA)    Difficulty of Paying Living Expenses: Not hard at all  Food Insecurity: No Food Insecurity (01/22/2023)   Hunger Vital Sign    Worried About Running Out of Food in the Last Year: Never true    Ran Out of Food in the Last Year: Never true  Transportation Needs: No Transportation Needs (01/22/2023)   PRAPARE - Administrator, Civil Service (Medical): No    Lack of Transportation (Non-Medical): No  Physical Activity: Insufficiently Active (01/22/2023)   Exercise Vital Sign    Days of  Exercise per Week: 3 days    Minutes of Exercise per Session: 30 min  Stress: No Stress Concern Present (01/22/2023)   [employer] of Occupational Health - Occupational Stress Questionnaire    Feeling of Stress : Not at all  Social Connections: Socially Integrated (01/22/2023)   Social Connection and Isolation Panel    Frequency of Communication with Friends and Family: More than three times a week    Frequency of Social Gatherings with Friends and Family: More than three times a week    Attends Religious Services: More than 4 times per year    Active Member of Clubs or Organizations: Yes    Attends Banker Meetings: More than 4 times per year    Marital Status: Married  Catering manager Violence: Not At Risk (01/22/2023)   Humiliation, Afraid, Rape, and Kick questionnaire    Fear of  Current or Ex-Partner: No    Emotionally Abused: No    Physically Abused: No    Sexually Abused: No    Past Surgical History:  Procedure Laterality Date   CHOLECYSTECTOMY  05/18/2002   COLONOSCOPY  02/24/2013   DB-MAC-moviprep (exc)-normal-10 yr recall   LYMPH NODE BIOPSY N/A 10/22/2021   Procedure: Sentinel LYMPH NODE BIOPSY;  Surgeon: Viktoria Comer SAUNDERS, MD;  Location: [facility];  Service: Gynecology;  Laterality: N/A;   ROBOTIC ASSISTED TOTAL HYSTERECTOMY WITH BILATERAL SALPINGO OOPHERECTOMY Bilateral 10/22/2021   Procedure: XI ROBOTIC ASSISTED TOTAL HYSTERECTOMY WITH BILATERAL SALPINGO OOPHORECTOMY;  Surgeon: Viktoria Comer SAUNDERS, MD;  Location: [facility];  Service: Gynecology;  Laterality: Bilateral;    Family History  Problem Relation Age of Onset   Cancer Mother 70       primary peritoneal cancer   Alzheimer's disease Mother    Heart disease Father    Pancreatic cancer Father 52   Hypertension Brother    Heart attack Brother 79       MI x 2   Breast cancer Paternal Aunt 72   Diabetes Paternal Grandmother    Colon cancer Neg Hx    Prostate cancer Neg Hx    Ovarian cancer Neg Hx    Colon polyps Neg Hx    Esophageal cancer Neg Hx    Rectal cancer Neg Hx    Stomach cancer Neg Hx     No Known Allergies  Current Outpatient Medications on File Prior to Visit  Medication Sig Dispense Refill   acetaminophen  (TYLENOL ) 500 MG tablet Take 500-1,000 mg by mouth every 6 (six) hours as needed (pain.).     aspirin EC 81 MG tablet Take 81 mg by mouth in the morning.     Cholecalciferol (VITAMIN D3 PO) Take 1 tablet by mouth daily.     Continuous Glucose Sensor (FREESTYLE LIBRE 3 PLUS SENSOR) MISC Change sensor every 15 days. 1 each 2   ibuprofen  (ADVIL ) 600 MG tablet Take 1 tablet (600 mg total) by mouth every 6 (six) hours as needed for moderate pain. For AFTER surgery only 30 tablet 0   losartan -hydrochlorothiazide  (HYZAAR) 50-12.5 MG tablet TAKE 1 TABLET BY MOUTH DAILY 90 tablet 1    Semaglutide -Weight Management (WEGOVY ) 0.25 MG/0.5ML SOAJ Inject 0.25 mg into the skin once a week. 2 mL 0   No current facility-administered medications on file prior to visit.    BP 130/70   Pulse 72   Temp 98.4 F (36.9 C) (Oral)   Ht 5' 4.75 (1.645 m)   Wt 240 lb (108.9 kg)   SpO2 98%  BMI 40.25 kg/m       Objective:   Physical Exam Vitals and nursing note reviewed.  Constitutional:      Appearance: Normal appearance. She is obese.  Cardiovascular:     Rate and Rhythm: Normal rate and regular rhythm.     Pulses: Normal pulses.     Heart sounds: Normal heart sounds.  Pulmonary:     Effort: Pulmonary effort is normal.     Breath sounds: Normal breath sounds.  Skin:    General: Skin is warm and dry.  Neurological:     General: No focal deficit present.     Mental Status: She is alert and oriented to person, place, and time.  Psychiatric:        Mood and Affect: Mood normal.        Behavior: Behavior normal.        Thought Content: Thought content normal.        Judgment: Judgment normal.       Assessment & Plan:  1. Obesity, Class II, BMI 35-39.9 (Primary) - she has lost 4 pounds over the last month. Will increase her dose to 0.5 mg weekly - Follow up in 30 days  - semaglutide -weight management (WEGOVY ) 0.5 MG/0.5ML SOAJ SQ injection; Inject 0.5 mg into the skin once a week.  Dispense: 2 mL; Refill: 0 - Continuous Glucose Sensor (FREESTYLE LIBRE 3 PLUS SENSOR) MISC; Change sensor every 15 days.  Dispense: 2 each; Refill: 6  2. Pre-diabetes  - semaglutide -weight management (WEGOVY ) 0.5 MG/0.5ML SOAJ SQ injection; Inject 0.5 mg into the skin once a week.  Dispense: 2 mL; Refill: 0 - Continuous Glucose Sensor (FREESTYLE LIBRE 3 PLUS SENSOR) MISC; Change sensor every 15 days.  Dispense: 2 each; Refill: 6  Tanvi Gatling, NP

## 2024-01-19 ENCOUNTER — Ambulatory Visit: Attending: Adult Health | Admitting: Physical Therapy

## 2024-01-19 ENCOUNTER — Encounter: Payer: Self-pay | Admitting: Physical Therapy

## 2024-01-19 DIAGNOSIS — R262 Difficulty in walking, not elsewhere classified: Secondary | ICD-10-CM | POA: Diagnosis not present

## 2024-01-19 DIAGNOSIS — M6281 Muscle weakness (generalized): Secondary | ICD-10-CM | POA: Insufficient documentation

## 2024-01-19 DIAGNOSIS — M25551 Pain in right hip: Secondary | ICD-10-CM | POA: Diagnosis not present

## 2024-01-19 DIAGNOSIS — M5459 Other low back pain: Secondary | ICD-10-CM | POA: Diagnosis not present

## 2024-01-19 NOTE — Therapy (Signed)
 OUTPATIENT PHYSICAL THERAPY TREATMENT   Patient Name: Kylie Davis MRN: [id number] DOB:01/10/1951, 73 y.o., female Today's Date: 01/19/2024  END OF SESSION:  PT End of Session - 01/19/24 1242     Visit Number 4    Number of Visits 16    Date for PT Re-Evaluation 02/24/24    Authorization Type [employer]: 16 visits 8/14-10/14/25    Authorization - Visit Number 4    Authorization - Number of Visits 16    Progress Note Due on Visit 10    PT Start Time 1148    PT Stop Time 1233    PT Time Calculation (min) 45 min    Activity Tolerance Patient tolerated treatment well    Behavior During Therapy [facility] for tasks assessed/performed             Past Medical History:  Diagnosis Date   Arthritis    BMI 39.0-39.9,adult    Diabetes (HCC)    on Wegovy    Endometrial cancer (HCC) 09/2021   sx and radiation   Family history of pancreatic cancer    Family history of peritoneal cancer    History of radiation therapy    Endometrium- HDR 12/16/21-01/15/20- Dr. Lynwood Nasuti   Hypertension    PONV (postoperative nausea and vomiting)    Sleep apnea    C PAP   Past Surgical History:  Procedure Laterality Date   CHOLECYSTECTOMY  05/18/2002   COLONOSCOPY  02/24/2013   DB-MAC-moviprep (exc)-normal-10 yr recall   LYMPH NODE BIOPSY N/A 10/22/2021   Procedure: Sentinel LYMPH NODE BIOPSY;  Surgeon: Viktoria Comer SAUNDERS, MD;  Location: [facility];  Service: Gynecology;  Laterality: N/A;   ROBOTIC ASSISTED TOTAL HYSTERECTOMY WITH BILATERAL SALPINGO OOPHERECTOMY Bilateral 10/22/2021   Procedure: XI ROBOTIC ASSISTED TOTAL HYSTERECTOMY WITH BILATERAL SALPINGO OOPHORECTOMY;  Surgeon: Viktoria Comer SAUNDERS, MD;  Location: [facility];  Service: Gynecology;  Laterality: Bilateral;   Patient Active Problem List   Diagnosis Date Noted   Genetic testing 01/02/2022   Family history of peritoneal cancer 12/10/2021   Family history of pancreatic cancer 12/10/2021   Body mass index (BMI) 40.0-44.9, adult (HCC)  10/22/2021   Endometrial cancer (HCC) 10/06/2021   Obesity, Class II, BMI 35-39.9 09/10/2015   Family history of malignant neoplasm of ovary 01/18/2014   Family history of malignant neoplasm of breast 01/18/2014   Family history of malignant neoplasm of gastrointestinal tract 01/18/2014   OSA (obstructive sleep apnea) 10/10/2013   Sleep apnea 09/13/2013   Allergic rhinitis 09/06/2012   Essential hypertension, benign 09/06/2012    PCP: Merna Huxley, NP  REFERRING PROVIDER: Merna Huxley, NP  REFERRING DIAG: [id number] (ICD-10-CM) - Chronic right hip pain  THERAPY DIAG:  Pain in right hip  Muscle weakness (generalized)  Difficulty in walking, not elsewhere classified  Other low back pain  Rationale for Evaluation and Treatment: Rehabilitation  ONSET DATE: Chronic and it has progressively gotten worse  SUBJECTIVE:   SUBJECTIVE STATEMENT: I looked it up and knee pain is a side effect of wegovy . My MD said try injecting it in my stomach and not my leg.  PERTINENT HISTORY: Diabetes; OA; HTN  PAIN: 01/19/24 Are you having pain? Yes: NPRS scale: 5-6/10 Pain location: right groin (mainly) ; some lateral hip but not as much Pain description: sharp Aggravating factors: any movements with right leg; putting on socks; walking > 25-30 mins; bend to pick up an object  Relieving factors: medication  PRECAUTIONS: None  RED FLAGS: None   WEIGHT BEARING RESTRICTIONS:  No  FALLS:  Has patient fallen in last 6 months? 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  DIAGNOSTIC FINDINGS: 09/01/23 Right Hip MRI IMPRESSION: 1. Moderate osteoarthritis of the right hip. Right labral degeneration and anterosuperior labral tear. 2. Mild tendinosis of the right hamstring origin. 3. Severe disc height loss at L5-S1. 4. Mild osteoarthritis of bilateral SI joints.  PATIENT SURVEYS:  LEFS: 38/80 47.5%  COGNITION: Overall cognitive status: Within functional limits for tasks assessed     SENSATION: WFL   MUSCLE LENGTH: Hamstrings: decreased bilateral    POSTURE: rounded shoulders and forward head    LOWER EXTREMITY ROM: limited right hip IR and abduction; unable to achieve FABER position on Rt ; LEFT hip ROM WFL   LOWER EXTREMITY MMT: Grossly 4- to 4 /5 bilateral    FUNCTIONAL TESTS:  5 times sit to stand: 13.21 sec no UE support ( no hip pain) Timed up and go (TUG): 8.88 sec  GAIT:  Comments: No notable impairments                                                                                                                                TREATMENT DATE:  01/19/2024 NuStep: Level 5x 8 minutes-PT present to discuss progress Tried standing hamstring stretch at stair but this was not comfortable for patient's knees performed seated instead only did one set of 20 sec each Seated hamstring stretch 2x20 seconds Long arc quads: 2# added 2x10 Standing bil hip abduction 2x10 2# AW Single leg on theraband opposite leg swings (forward, lateral, backwards) x 5 each direction Bil heel raises: 2x10 standing on foam pad Seated modified deadlift 5# KB 2 x 10 Farmer's carry with 5# DB x 1 lap around both gyms     01/12/2024 NuStep: Level 5x 8 minutes-PT present to discuss progress Seated hamstring stretch 3x20 seconds, one set standing using power plate ER with 2# ankle weights seated 2x10, more challenge on Lt Long arc quads: 2# added  2x10 Seated hip abduction with green band 2x10 Standing on balance pad: bil hip abduction 2x10 Weight shifting 3 ways on balance pad x1 min each  Sit to stand 2x10 holding 5# kettlebell  Bil heel raises: 2x10 standing on foam pad  01/05/2024 NuStep: Level 5x 6 minutes-PT present to discuss progress Seated hamstring stretch 3x20 seconds  Seated hip abduction with green band 2x10 Supine hip IR/ER x10 SLR with quad set x10 Rt and Lt Supine ball squeeze  with TA activation 5 hold x20  Weight shifting 3 ways on balance pad x1 min each  Sit to stand 2x10  Bil heel raises: 2x10    PATIENT EDUCATION:  Education details: PT eval findings, anticipated POC, progress with PT, and initial HEP Person educated: Patient Education method: Explanation, Demonstration, and Handouts Education comprehension: verbalized understanding, returned demonstration, and needs further education  HOME EXERCISE PROGRAM: Access Code: [email or web address] URL: [email or web address] Date: 01/19/2024 Prepared by: Kristeen Sar  Exercises - Supine Hip Internal and External Rotation  - 1 x daily - 7 x weekly - 1 sets - 10 reps - 4 hold - Seated Hamstring Stretch  - 1 x daily - 7 x weekly - 2 sets - 20-30s hold - Active Straight Leg Raise with Quad Set  - 1 x daily - 7 x weekly - 2 sets - 10 reps - Seated Hip Abduction with Resistance  - 1 x daily - 7 x weekly - 2 sets - 10 reps - Supine Hip Adduction Isometric with Ball  - 1 x daily - 7 x weekly - 3 sets - 10 reps - Seated Transversus Abdominis Bracing  - 1 x daily - 7 x weekly - 3 sets - 10 reps - Sit to Stand Without Arm Support  - 2 x daily - 7 x weekly - 2 sets - 10 reps - Supine Hip External Rotation AAROM  - 2-3 x daily - 7 x weekly - 1 sets - 3 reps - 20 hold - Leg Swing Single Leg Balance  - 1 x daily - 7 x weekly - 1 sets - 8-10 reps - Seated Hip Hinge with Dowel  - 1 x daily - 7 x weekly - 2 sets - 10 reps ASSESSMENT:  CLINICAL IMPRESSION: Patient  continues to verbalize increased knee pain that she attributes to [facility]. She wakes up in the middle of the night due to pain. Educated patient on the potential benefit of adding ice to her routine. She is tolerating currently level of exercise well. Incorporated more single leg balance exercises on unstable surfaces. Updated HEP to include exercise progressions. PT monitored patient throughout and provided verbal and visual cues as needed   OBJECTIVE IMPAIRMENTS: decreased activity tolerance, difficulty walking, decreased ROM, decreased strength, hypomobility, increased muscle spasms, impaired flexibility, improper body mechanics, and pain.   ACTIVITY LIMITATIONS: lifting, bending, squatting, stairs, bed mobility, bathing, dressing, hygiene/grooming, and locomotion level  PARTICIPATION LIMITATIONS: meal prep, cleaning, laundry, shopping, and community activity  PERSONAL FACTORS: Age, Fitness, and 3+ comorbidities: Diabetes; OA; HTN are also affecting patient's functional outcome.   REHAB POTENTIAL: Good  CLINICAL DECISION MAKING: Stable/uncomplicated  EVALUATION COMPLEXITY: Low   GOALS: Goals reviewed with patient? Yes  SHORT TERM GOALS: Target date: 01/27/2024  Patient will be independent with initial HEP. Baseline: independent in current program  Goal status: in progress   2.  Patient will report > or = to 30% improvement in hip pain since starting PT. Baseline: better overall but doesn't rate it (01/12/24) Goal status: In progress   3.  Patient will demonstrate correct technique when bending/ stooping to pick up an object to prevent mechanical strain and injury. Baseline:  Goal status: INITIAL   LONG TERM GOALS: Target date: 02/23/2024  Patient will demonstrate independence in advanced HEP. Baseline:  Goal status: INITIAL  2.  Patient will report > or = to 70% improvement in right hip pain since starting PT. Baseline:  Goal status: INITIAL  3.  Patient will  verbalize and  demonstrate self-care strategies to manage pain including tissue mobility practices and change of position. Baseline:  Goal status: INITIAL  4.  Patient will be able to bend and put on her socks with < or = to 2/10 hip pain. Baseline: unable Goal status: INITIAL  5.  Patient will score > or = to 50/80 on LEFS due to improved function and mobility. Baseline:  Goal status: INITIAL  6.  Patient will score < or = to 10 sec on 5STS due to improved strength of hip musculature. Baseline: 13.21sec Goal status: INITIAL  6.  Patient will be able to participate in at least 30 mins of walking with no hip pain to establish regular routine to improve cardiovascular endurance. Baseline:  Goal status: INITIAL   PLAN:  PT FREQUENCY: 1-2x/week  PT DURATION: 8 weeks  PLANNED INTERVENTIONS: 97164- PT Re-evaluation, 97110-Therapeutic exercises, 97530- Therapeutic activity, 97112- Neuromuscular re-education, 97535- Self Care, [id number]- Manual therapy, [phone]- Gait training, [phone]- Canalith repositioning, [id number]- Aquatic Therapy, [phone]- Electrical stimulation (unattended), [phone]- Electrical stimulation (manual), [id number]- Ultrasound, [id number]- Traction (mechanical), [id number]- Ionotophoresis 4mg /ml Dexamethasone , [id number] (1-2 muscles), [id number] (3+ muscles)- Dry Needling, Patient/Family education, Balance training, Stair training, Taping, Joint mobilization, Joint manipulation, Spinal manipulation, Spinal mobilization, Vestibular training, Cryotherapy, and Moist heat  PLAN FOR NEXT SESSION: assess updated Review HEP; NuStep; glute strengthening; hip flexibility   Kristeen Sar, PT 01/19/24 12:44 PM [facility] [address] [place], [place] [address] Phone # [phone] Fax [phone]

## 2024-01-25 ENCOUNTER — Encounter (HOSPITAL_BASED_OUTPATIENT_CLINIC_OR_DEPARTMENT_OTHER): Payer: Self-pay | Admitting: Physical Therapy

## 2024-01-25 ENCOUNTER — Ambulatory Visit (HOSPITAL_BASED_OUTPATIENT_CLINIC_OR_DEPARTMENT_OTHER): Attending: Adult Health | Admitting: Physical Therapy

## 2024-01-25 DIAGNOSIS — R262 Difficulty in walking, not elsewhere classified: Secondary | ICD-10-CM | POA: Insufficient documentation

## 2024-01-25 DIAGNOSIS — M25551 Pain in right hip: Secondary | ICD-10-CM | POA: Diagnosis not present

## 2024-01-25 DIAGNOSIS — M5459 Other low back pain: Secondary | ICD-10-CM | POA: Insufficient documentation

## 2024-01-25 DIAGNOSIS — M6281 Muscle weakness (generalized): Secondary | ICD-10-CM | POA: Insufficient documentation

## 2024-01-25 NOTE — Therapy (Signed)
 OUTPATIENT PHYSICAL THERAPY TREATMENT   Patient Name: Kylie Davis MRN: [id number] DOB:1950-08-19, 73 y.o., female Today's Date: 01/25/2024  END OF SESSION:  PT End of Session - 01/25/24 1245     Visit Number 5    Number of Visits 16    Date for PT Re-Evaluation 02/24/24    Authorization Type [employer]: 16 visits 8/14-10/14/25    Authorization - Number of Visits 16    Progress Note Due on Visit 10    PT Start Time 0805    PT Stop Time 0845    PT Time Calculation (min) 40 min    Activity Tolerance Patient tolerated treatment well    Behavior During Therapy [facility] for tasks assessed/performed              Past Medical History:  Diagnosis Date   Arthritis    BMI 39.0-39.9,adult    Diabetes (HCC)    on [place]    Endometrial cancer (HCC) 09/2021   sx and radiation   Family history of pancreatic cancer    Family history of peritoneal cancer    History of radiation therapy    Endometrium- HDR 12/16/21-01/15/20- Dr. Lynwood Nasuti   Hypertension    PONV (postoperative nausea and vomiting)    Sleep apnea    C PAP   Past Surgical History:  Procedure Laterality Date   CHOLECYSTECTOMY  05/18/2002   COLONOSCOPY  02/24/2013   DB-MAC-moviprep (exc)-normal-10 yr recall   LYMPH NODE BIOPSY N/A 10/22/2021   Procedure: Sentinel LYMPH NODE BIOPSY;  Surgeon: Viktoria Comer SAUNDERS, MD;  Location: [facility];  Service: Gynecology;  Laterality: N/A;   ROBOTIC ASSISTED TOTAL HYSTERECTOMY WITH BILATERAL SALPINGO OOPHERECTOMY Bilateral 10/22/2021   Procedure: XI ROBOTIC ASSISTED TOTAL HYSTERECTOMY WITH BILATERAL SALPINGO OOPHORECTOMY;  Surgeon: Viktoria Comer SAUNDERS, MD;  Location: [facility];  Service: Gynecology;  Laterality: Bilateral;   Patient Active Problem List   Diagnosis Date Noted   Genetic testing 01/02/2022   Family history of peritoneal cancer 12/10/2021   Family history of pancreatic cancer 12/10/2021   Body mass index (BMI) 40.0-44.9, adult (HCC) 10/22/2021   Endometrial cancer  (HCC) 10/06/2021   Obesity, Class II, BMI 35-39.9 09/10/2015   Family history of malignant neoplasm of ovary 01/18/2014   Family history of malignant neoplasm of breast 01/18/2014   Family history of malignant neoplasm of gastrointestinal tract 01/18/2014   OSA (obstructive sleep apnea) 10/10/2013   Sleep apnea 09/13/2013   Allergic rhinitis 09/06/2012   Essential hypertension, benign 09/06/2012    PCP: Merna Huxley, NP  REFERRING PROVIDER: Merna Huxley, NP  REFERRING DIAG: [id number] (ICD-10-CM) - Chronic right hip pain  THERAPY DIAG:  Pain in right hip  Muscle weakness (generalized)  Difficulty in walking, not elsewhere classified  Other low back pain  Rationale for Evaluation and Treatment: Rehabilitation  ONSET DATE: Chronic and it has progressively gotten worse  SUBJECTIVE:   SUBJECTIVE STATEMENT: I have been off of the [facility] for about a week and my pain has gotten better.  Hip pain 3/10. Had trouble climbing stairs the other day  PERTINENT HISTORY: Diabetes; OA; HTN  PAIN: 01/19/24 Are you having pain? Yes: NPRS scale: 5-6/10 Pain location: right groin (mainly) ; some lateral hip but not as much Pain description: sharp Aggravating factors: any movements with right leg; putting on socks; walking > 25-30 mins; bend to pick up an object  Relieving factors: medication  PRECAUTIONS: None  RED FLAGS: None   WEIGHT BEARING RESTRICTIONS: No  FALLS:  Has  patient fallen in last 6 months? 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  DIAGNOSTIC FINDINGS: 09/01/23 Right Hip MRI IMPRESSION: 1. Moderate osteoarthritis of the right hip. Right labral degeneration and anterosuperior labral tear. 2. Mild tendinosis of the right hamstring origin. 3. Severe disc height loss at L5-S1. 4. Mild osteoarthritis of bilateral SI joints.  PATIENT SURVEYS:  LEFS: 38/80 47.5%  COGNITION: Overall cognitive status: Within functional limits for tasks assessed     SENSATION: WFL   MUSCLE LENGTH: Hamstrings: decreased bilateral    POSTURE: rounded shoulders and forward head    LOWER EXTREMITY ROM: limited right hip IR and abduction; unable to achieve FABER position on Rt ; LEFT hip ROM WFL   LOWER EXTREMITY MMT: Grossly 4- to 4 /5 bilateral    FUNCTIONAL TESTS:  5 times sit to stand: 13.21 sec no UE support ( no hip pain) Timed up and go (TUG): 8.88 sec  GAIT:  Comments: No notable impairments                                                                                                                                TREATMENT DATE:   [facility] Adult PT Treatment:                                                DATE: 01/25/24 Pt seen for aquatic therapy today.  Treatment took place in water  3.5-4.75 ft in depth at the [facility] pool. Temp of water  was 91.  Pt entered/exited the pool via stairs using alternating pattern with hand rail.  *Intro to setting *walking forward, back and side stepping in 3.6 ft with unsupported *HB carry yellow: forward, back and side stepping bilaterally and unilaterally *figure 4 stretch *Ue support on wall: toe raises x 10; heel raises x 10; hip add/abd 2 x 5; Hip add/abd crossing midline 2 x 5(good challenge); hip flex/extension x 5; relaxed squats x 10 *cycling on noodle->ue support corner wall: hip add/abd; hip flex/ext   Pt requires the buoyancy and hydrostatic pressure of water   for support, and to offload joints by unweighting joint load by at least 50 % in navel deep water  and by at least 75-80% in chest to neck deep water .  Viscosity of the water  is needed for resistance of strengthening. Water  current perturbations provides challenge  to standing balance requiring increased core activation.       01/19/2024 NuStep: Level 5x 8 minutes-PT present to discuss progress Tried standing hamstring stretch at stair but this was not comfortable for patient's knees performed seated instead only did one set of 20 sec each Seated hamstring stretch 2x20 seconds Long arc quads: 2# added 2x10 Standing bil hip abduction 2x10 2# AW Single leg on theraband opposite leg swings (forward, lateral, backwards) x 5 each direction Bil heel raises: 2x10 standing on foam pad Seated modified deadlift 5# KB 2 x 10 Farmer's carry with 5# DB x 1 lap around both gyms     01/12/2024 NuStep: Level 5x 8 minutes-PT present to discuss progress Seated hamstring stretch 3x20 seconds, one set standing using power plate ER with 2# ankle weights seated 2x10, more challenge on Lt Long arc quads: 2# added 2x10 Seated hip abduction with green band 2x10 Standing on balance pad: bil hip abduction 2x10 Weight shifting 3 ways on balance pad x1 min each  Sit to stand 2x10 holding 5# kettlebell  Bil heel raises: 2x10 standing on foam pad  01/05/2024 NuStep: Level 5x 6 minutes-PT present to discuss progress Seated hamstring stretch 3x20 seconds  Seated hip abduction with green band 2x10 Supine hip IR/ER x10 SLR with quad set x10 Rt and Lt Supine ball squeeze with TA activation 5 hold x20  Weight shifting 3 ways on balance pad x1 min each  Sit to stand 2x10  Bil heel raises: 2x10    PATIENT EDUCATION:  Education details: PT eval findings, anticipated POC, progress with PT, and initial HEP Person educated: Patient Education method: Explanation, Demonstration, and Handouts Education comprehension:  verbalized understanding, returned demonstration, and needs further education  HOME EXERCISE PROGRAM: Access Code: [email or web address] URL: [email or web address] Date: 01/19/2024 Prepared by: Kristeen Sar  Exercises - Supine Hip Internal and External Rotation  - 1 x daily - 7 x weekly - 1 sets - 10 reps - 4 hold - Seated Hamstring Stretch  - 1 x daily - 7 x weekly - 2 sets - 20-30s hold - Active Straight Leg Raise with Quad Set  - 1 x daily - 7 x weekly - 2 sets - 10 reps - Seated Hip Abduction with Resistance  - 1 x daily - 7 x weekly - 2 sets - 10 reps - Supine Hip Adduction Isometric with Ball  - 1 x daily - 7 x weekly - 3 sets - 10 reps - Seated Transversus Abdominis Bracing  - 1 x daily - 7 x weekly - 3 sets - 10 reps - Sit to Stand Without Arm Support  - 2 x daily - 7 x weekly - 2 sets - 10 reps - Supine Hip External Rotation AAROM  - 2-3 x daily - 7 x weekly - 1 sets - 3 reps - 20 hold - Leg Swing Single Leg Balance  - 1 x daily - 7 x weekly - 1 sets - 8-10 reps - Seated Hip Hinge with Dowel  - 1 x daily - 7 x weekly - 2 sets - 10 reps ASSESSMENT:  CLINICAL IMPRESSION: Pt demonstrates safety and independence in aquatic setting with therapist instructing from deck. She is confident in setting, moving throughout all depths easily.  Pt is directed through various movement patterns and trials in both sitting and standing positions. She is challenged with crossing midline (LE) maintaining balance/SLS. Cues provided for pacing of activity  and execution. Good toleration. Goals are ongoing.  Patient continues to verbalize increased knee pain that she attributes to [facility]. She wakes up in the middle of the night due to pain. Educated patient on the potential benefit of adding ice to her routine. She is tolerating currently level of exercise well. Incorporated more single leg balance exercises on unstable surfaces. Updated HEP to include exercise progressions. PT monitored patient  throughout and provided verbal and visual cues as needed   OBJECTIVE IMPAIRMENTS: decreased activity tolerance, difficulty walking, decreased ROM, decreased strength, hypomobility, increased muscle spasms, impaired flexibility, improper body mechanics, and pain.   ACTIVITY LIMITATIONS: lifting, bending, squatting, stairs, bed mobility, bathing, dressing, hygiene/grooming, and locomotion level  PARTICIPATION LIMITATIONS: meal prep, cleaning, laundry, shopping, and community activity  PERSONAL FACTORS: Age, Fitness, and 3+ comorbidities: Diabetes; OA; HTN are also affecting patient's functional outcome.   REHAB POTENTIAL: Good  CLINICAL DECISION MAKING: Stable/uncomplicated  EVALUATION COMPLEXITY: Low   GOALS: Goals reviewed with patient? Yes  SHORT TERM GOALS: Target date: 01/27/2024  Patient will be independent with initial HEP. Baseline: independent in current program  Goal status: in progress   2.  Patient will report > or = to 30% improvement in hip pain since starting PT. Baseline: better overall but doesn't rate it (01/12/24) Goal status: In progress   3.  Patient will demonstrate correct technique when bending/ stooping to pick up an object to prevent mechanical strain and injury. Baseline:  Goal status: INITIAL   LONG TERM GOALS: Target date: 02/23/2024  Patient will demonstrate independence in advanced HEP. Baseline:  Goal status: INITIAL  2.  Patient will report > or = to 70% improvement in right hip pain since starting PT. Baseline:  Goal status: INITIAL  3.  Patient will verbalize and demonstrate self-care strategies to manage pain including tissue mobility practices and change of position. Baseline:  Goal status: INITIAL  4.  Patient will be able to bend and put on her socks with < or = to 2/10 hip pain. Baseline: unable Goal status: INITIAL  5.  Patient will score > or = to 50/80 on LEFS due to improved function and mobility. Baseline:  Goal status:  INITIAL  6.  Patient will score < or = to 10 sec on 5STS due to improved strength of hip musculature. Baseline: 13.21sec Goal status: INITIAL  6.  Patient will be able to participate in at least 30 mins of walking with no hip pain to establish regular routine to improve cardiovascular endurance. Baseline:  Goal status: INITIAL   PLAN:  PT FREQUENCY: 1-2x/week  PT DURATION: 8 weeks  PLANNED INTERVENTIONS: [id number]- PT Re-evaluation, 97110-Therapeutic exercises, 97530- Therapeutic activity, 97112- Neuromuscular re-education, 97535- Self Care, [id number]- Manual therapy, [phone]- Gait training, [phone]- Canalith repositioning, [id number]- Aquatic Therapy, [phone]- Electrical stimulation (unattended), [phone]- Electrical stimulation (manual), [id number]- Ultrasound, [id number]- Traction (mechanical), [id number]- Ionotophoresis 4mg /ml Dexamethasone , [id number] (1-2 muscles), [id number] (3+ muscles)- Dry Needling, Patient/Family education, Balance training, Stair training, Taping, Joint mobilization, Joint manipulation, Spinal manipulation, Spinal mobilization, Vestibular training, Cryotherapy, and Moist heat  PLAN FOR NEXT SESSION: assess updated Review HEP; NuStep; glute strengthening; hip flexibility   Ronal Kem) Libbie Bartley MPT 01/25/24 12:46 PM [facility] [address] [place], [place], [address] Phone: [phone]   Fax:  [phone]

## 2024-01-26 ENCOUNTER — Ambulatory Visit

## 2024-01-26 ENCOUNTER — Ambulatory Visit (INDEPENDENT_AMBULATORY_CARE_PROVIDER_SITE_OTHER): Payer: Medicare PPO

## 2024-01-26 VITALS — BP 122/62 | HR 78 | Temp 98.4°F | Ht 64.0 in | Wt 243.3 lb

## 2024-01-26 DIAGNOSIS — M5459 Other low back pain: Secondary | ICD-10-CM | POA: Diagnosis not present

## 2024-01-26 DIAGNOSIS — Z0000 Encounter for general adult medical examination without abnormal findings: Secondary | ICD-10-CM

## 2024-01-26 DIAGNOSIS — M25551 Pain in right hip: Secondary | ICD-10-CM | POA: Diagnosis not present

## 2024-01-26 DIAGNOSIS — R262 Difficulty in walking, not elsewhere classified: Secondary | ICD-10-CM

## 2024-01-26 DIAGNOSIS — M6281 Muscle weakness (generalized): Secondary | ICD-10-CM | POA: Diagnosis not present

## 2024-01-26 NOTE — Therapy (Signed)
 OUTPATIENT PHYSICAL THERAPY TREATMENT   Patient Name: Kylie Davis MRN: [id number] DOB:08/08/1950, 73 y.o., female Today's Date: 01/26/2024  END OF SESSION:  PT End of Session - 01/26/24 1020     Visit Number 6    Date for PT Re-Evaluation 02/24/24    Authorization Type [employer]: 16 visits 8/14-10/14/25    Authorization - Visit Number 6    Authorization - Number of Visits 16    Progress Note Due on Visit 10    PT Start Time 0932    PT Stop Time 1016    PT Time Calculation (min) 44 min    Activity Tolerance Patient tolerated treatment well    Behavior During Therapy [facility] for tasks assessed/performed               Past Medical History:  Diagnosis Date   Arthritis    BMI 39.0-39.9,adult    Diabetes (HCC)    on Wegovy    Endometrial cancer (HCC) 09/2021   sx and radiation   Family history of pancreatic cancer    Family history of peritoneal cancer    History of radiation therapy    Endometrium- HDR 12/16/21-01/15/20- Dr. Lynwood Nasuti   Hypertension    PONV (postoperative nausea and vomiting)    Sleep apnea    C PAP   Past Surgical History:  Procedure Laterality Date   CHOLECYSTECTOMY  05/18/2002   COLONOSCOPY  02/24/2013   DB-MAC-moviprep (exc)-normal-10 yr recall   LYMPH NODE BIOPSY N/A 10/22/2021   Procedure: Sentinel LYMPH NODE BIOPSY;  Surgeon: Viktoria Comer SAUNDERS, MD;  Location: [facility];  Service: Gynecology;  Laterality: N/A;   ROBOTIC ASSISTED TOTAL HYSTERECTOMY WITH BILATERAL SALPINGO OOPHERECTOMY Bilateral 10/22/2021   Procedure: XI ROBOTIC ASSISTED TOTAL HYSTERECTOMY WITH BILATERAL SALPINGO OOPHORECTOMY;  Surgeon: Viktoria Comer SAUNDERS, MD;  Location: [facility];  Service: Gynecology;  Laterality: Bilateral;   Patient Active Problem List   Diagnosis Date Noted   Genetic testing 01/02/2022   Family history of peritoneal cancer 12/10/2021   Family history of pancreatic cancer 12/10/2021   Body mass index (BMI) 40.0-44.9, adult (HCC) 10/22/2021    Endometrial cancer (HCC) 10/06/2021   Obesity, Class II, BMI 35-39.9 09/10/2015   Family history of malignant neoplasm of ovary 01/18/2014   Family history of malignant neoplasm of breast 01/18/2014   Family history of malignant neoplasm of gastrointestinal tract 01/18/2014   OSA (obstructive sleep apnea) 10/10/2013   Sleep apnea 09/13/2013   Allergic rhinitis 09/06/2012   Essential hypertension, benign 09/06/2012    PCP: Merna Huxley, NP  REFERRING PROVIDER: Merna Huxley, NP  REFERRING DIAG: [phone] (ICD-10-CM) - Chronic right hip pain  THERAPY DIAG:  Pain in right hip  Muscle weakness (generalized)  Difficulty in walking, not elsewhere classified  Other low back pain  Rationale for Evaluation and Treatment: Rehabilitation  ONSET DATE: Chronic and it has progressively gotten worse  SUBJECTIVE:   SUBJECTIVE STATEMENT: I was sore after water  therapy and had to take ibuprofen .  Better now.    PERTINENT HISTORY: Diabetes; OA; HTN  PAIN: 01/26/24 Are you having pain? Yes: NPRS scale: 2/10 Pain location: right groin (mainly) ; some lateral hip but not as much Pain description: sharp Aggravating factors: any movements with right leg; putting on socks; walking > 25-30 mins; bend to pick up an object  Relieving factors: medication  PRECAUTIONS: None  RED FLAGS: None   WEIGHT BEARING RESTRICTIONS: No  FALLS:  Has patient fallen in last 6 months? 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  DIAGNOSTIC FINDINGS: 09/01/23 Right Hip MRI IMPRESSION: 1. Moderate osteoarthritis of the right hip. Right labral degeneration and anterosuperior labral tear. 2. Mild tendinosis of the right hamstring origin. 3. Severe disc height loss at L5-S1. 4. Mild osteoarthritis of bilateral SI joints.  PATIENT SURVEYS:  LEFS: 38/80 47.5%  COGNITION: Overall cognitive status: Within functional limits for tasks assessed     SENSATION: WFL   MUSCLE LENGTH: Hamstrings: decreased bilateral    POSTURE: rounded shoulders and forward head    LOWER EXTREMITY ROM: limited right hip IR and abduction; unable to achieve FABER position on Rt ; LEFT hip ROM WFL   LOWER EXTREMITY MMT: Grossly 4- to 4 /5 bilateral    FUNCTIONAL TESTS:  5 times sit to stand: 13.21 sec no UE support ( no hip pain) Timed up and go (TUG): 8.88 sec  GAIT:  Comments: No notable impairments                                                                                                                                TREATMENT DATE:  01/26/2024 NuStep: Level 5x 8 minutes-PT present to discuss progress Seated modified crunch with 5# kettlebell 2x10 Seated on dynadisc with hip to knee diagonals with 5# kettlebell 2x10  Ball roll outs 3 ways 5 hold x5 each  Seated hamstring stretch 2x20 seconds Long arc quads: 2# added 2x10 Single leg on theraband opposite leg swings (forward, lateral, backwards) x 5 each direction Bil heel raises: 2x10 standing on foam pad Farmer's carry with 5# DB x 1 lap around both gyms   [facility] Adult PT Treatment:                                                DATE: 01/25/24 Pt seen for aquatic therapy today.  Treatment took place in water  3.5-4.75 ft in depth at the [facility] pool. Temp of water  was 91.  Pt entered/exited the pool via stairs using alternating pattern with hand rail.  *Intro to setting *walking forward, back and  side stepping in 3.6 ft with unsupported *HB carry yellow: forward, back and side stepping bilaterally and unilaterally *figure 4 stretch *Ue support on wall: toe raises x 10; heel raises x 10; hip add/abd 2 x 5; Hip add/abd crossing midline 2 x 5(good challenge);  hip flex/extension x 5; relaxed squats x 10 *cycling on noodle->ue support corner wall: hip add/abd; hip flex/ext   Pt requires the buoyancy and hydrostatic pressure of water  for support, and to offload joints by unweighting joint load by at least 50 % in navel deep water  and by at least 75-80% in chest to neck deep water .  Viscosity of the water  is needed for resistance of strengthening. Water  current perturbations provides challenge to standing balance requiring increased core activation.     01/19/2024 NuStep: Level 5x 8 minutes-PT present to discuss progress Tried standing hamstring stretch at stair but this was not comfortable for patient's knees performed seated instead only did one set of 20 sec each Seated hamstring stretch 2x20 seconds Long arc quads: 2# added 2x10 Standing bil hip abduction 2x10 2# AW Single leg on theraband opposite leg swings (forward, lateral, backwards) x 5 each direction Bil heel raises: 2x10 standing on foam pad Seated modified deadlift 5# KB 2 x 10 Farmer's carry with 5# DB x 1 lap around both gyms    PATIENT EDUCATION:  Education details: PT eval findings, anticipated POC, progress with PT, and initial HEP Person educated: Patient Education method: Explanation, Demonstration, and Handouts Education comprehension: verbalized understanding, returned demonstration, and needs further education  HOME EXERCISE PROGRAM: Access Code: [email or web address] URL: [email or web address] Date: 01/19/2024 Prepared by: Kristeen Sar  Exercises - Supine Hip Internal and External Rotation  - 1 x daily - 7 x weekly - 1 sets - 10 reps - 4 hold - Seated Hamstring Stretch  - 1 x daily - 7 x weekly - 2 sets -  20-30s hold - Active Straight Leg Raise with Quad Set  - 1 x daily - 7 x weekly - 2 sets - 10 reps - Seated Hip Abduction with Resistance  - 1 x daily - 7 x weekly - 2 sets - 10 reps - Supine Hip Adduction Isometric with Ball  - 1 x daily - 7 x weekly - 3 sets - 10 reps - Seated Transversus Abdominis Bracing  - 1 x daily - 7 x weekly - 3 sets - 10 reps - Sit to Stand Without Arm Support  - 2 x daily - 7 x weekly - 2 sets - 10 reps - Supine Hip External Rotation AAROM  - 2-3 x daily - 7 x weekly - 1 sets - 3 reps - 20 hold - Leg Swing Single Leg Balance  - 1 x daily - 7 x weekly - 1 sets - 8-10 reps - Seated Hip Hinge with Dowel  - 1 x daily - 7 x weekly - 2 sets - 10 reps ASSESSMENT:  CLINICAL IMPRESSION: Pt reports that her pain has fluctuated and feels some of the pain has been related to medication that that she is taking.  Pt is independent and compliant in HEP. Pt with some Lt knee pain today so didn't use ankle weight with LAQs on Lt.  She did well with core engagement with balance tasks and with sitting on dynadisc today.  She reports some soreness after aquatic PT, however enjoyed the treatment and felt like it was helpful.  PT monitored throughout session to provide verbal cues and monitor for pain and fatigue.  Patient will benefit from skilled PT to address the below impairments and improve overall function.      OBJECTIVE IMPAIRMENTS: decreased activity tolerance, difficulty walking, decreased ROM, decreased strength, hypomobility, increased muscle spasms, impaired flexibility, improper body mechanics, and pain.   ACTIVITY  LIMITATIONS: lifting, bending, squatting, stairs, bed mobility, bathing, dressing, hygiene/grooming, and locomotion level  PARTICIPATION LIMITATIONS: meal prep, cleaning, laundry, shopping, and community activity  PERSONAL FACTORS: Age, Fitness, and 3+ comorbidities: Diabetes; OA; HTN are also affecting patient's functional outcome.   REHAB POTENTIAL:  Good  CLINICAL DECISION MAKING: Stable/uncomplicated  EVALUATION COMPLEXITY: Low   GOALS: Goals reviewed with patient? Yes  SHORT TERM GOALS: Target date: 01/27/2024  Patient will be independent with initial HEP. Baseline:  Goal status: MET  2.  Patient will report > or = to 30% improvement in hip pain since starting PT. Baseline: better overall but doesn't rate it (01/12/24) Goal status: In progress   3.  Patient will demonstrate correct technique when bending/ stooping to pick up an object to prevent mechanical strain and injury. Baseline:  Goal status: MET   LONG TERM GOALS: Target date: 02/23/2024  Patient will demonstrate independence in advanced HEP. Baseline:  Goal status: INITIAL  2.  Patient will report > or = to 70% improvement in right hip pain since starting PT. Baseline:  Goal status: INITIAL  3.  Patient will verbalize and demonstrate self-care strategies to manage pain including tissue mobility practices and change of position. Baseline:  Goal status: INITIAL  4.  Patient will be able to bend and put on her socks with < or = to 2/10 hip pain. Baseline: unable Goal status: INITIAL  5.  Patient will score > or = to 50/80 on LEFS due to improved function and mobility. Baseline:  Goal status: INITIAL  6.  Patient will score < or = to 10 sec on 5STS due to improved strength of hip musculature. Baseline: 13.21sec Goal status: INITIAL  6.  Patient will be able to participate in at least 30 mins of walking with no hip pain to establish regular routine to improve cardiovascular endurance. Baseline:  Goal status: INITIAL   PLAN:  PT FREQUENCY: 1-2x/week  PT DURATION: 8 weeks  PLANNED INTERVENTIONS: [id number]- PT Re-evaluation, 97110-Therapeutic exercises, 97530- Therapeutic activity, [id number]- Neuromuscular re-education, 97535- Self Care, [id number]- Manual therapy, [id number]- Gait training, [phone]- Canalith repositioning, [id number]- Aquatic Therapy, [phone]- Electrical  stimulation (unattended), [phone]- Electrical stimulation (manual), [id number]- Ultrasound, [id number]- Traction (mechanical), [id number]- Ionotophoresis 4mg /ml Dexamethasone , [id number] (1-2 muscles), [id number] (3+ muscles)- Dry Needling, Patient/Family education, Balance training, Stair training, Taping, Joint mobilization, Joint manipulation, Spinal manipulation, Spinal mobilization, Vestibular training, Cryotherapy, and Moist heat  PLAN FOR NEXT SESSION: assess updated Review HEP; NuStep; glute strengthening; hip flexibility  Burnard Joy, PT 01/26/24 10:21 AM

## 2024-01-26 NOTE — Progress Notes (Signed)
 Subjective:   Kylie Davis is a 73 y.o. who presents for a Medicare Wellness preventive visit.  As a reminder, Annual Wellness Visits don't include a physical exam, and some assessments may be limited, especially if this visit is performed virtually. We may recommend an in-person follow-up visit with your provider if needed.  Visit Complete: In person    Persons Participating in Visit: Patient.  AWV Questionnaire: No: Patient Medicare AWV questionnaire was not completed prior to this visit.  Cardiac Risk Factors include: advanced age (>83men, >103 women);hypertension     Objective:    Today's Vitals   01/26/24 0840  BP: 122/62  Pulse: 78  Temp: 98.4 F (36.9 C)  TempSrc: Oral  SpO2: 97%  Weight: 243 lb 4.8 oz (110.4 kg)  Height: 5' 4 (1.626 m)   Body mass index is 41.76 kg/m.     01/26/2024    8:56 AM 12/30/2023   10:19 AM 01/22/2023    9:06 AM 01/21/2023   10:21 AM 10/22/2022   10:06 AM 09/03/2022   10:02 AM 02/19/2022    8:40 AM  Advanced Directives  Does Patient Have a Medical Advance Directive? Yes Yes Yes Yes Yes Yes Yes  Type of Estate agent of [place];Living will Living will Healthcare Power of [place];Living will  Healthcare Power of [place];Living will    Does patient want to make changes to medical advance directive?  Yes (ED - Information included in AVS)     No - Patient declined  Copy of Healthcare Power of Attorney in Chart? No - copy requested  No - copy requested      Would patient like information on creating a medical advance directive?      No - Patient declined     Current Medications (verified) Outpatient Encounter Medications as of 01/26/2024  Medication Sig   acetaminophen  (TYLENOL ) 500 MG tablet Take 500-1,000 mg by mouth every 6 (six) hours as needed (pain.).   aspirin EC 81 MG tablet Take 81 mg by mouth in the morning.   Cholecalciferol (VITAMIN D3 PO) Take 1 tablet by mouth daily.   Continuous Glucose Sensor  (FREESTYLE LIBRE 3 PLUS SENSOR) MISC Change sensor every 15 days.   ibuprofen  (ADVIL ) 600 MG tablet Take 1 tablet (600 mg total) by mouth every 6 (six) hours as needed for moderate pain. For AFTER surgery only   losartan -hydrochlorothiazide  (HYZAAR) 50-12.5 MG tablet TAKE 1 TABLET BY MOUTH DAILY   semaglutide -weight management (WEGOVY ) 0.5 MG/0.5ML SOAJ SQ injection Inject 0.5 mg into the skin once a week.   No facility-administered encounter medications on file as of 01/26/2024.    Allergies (verified) Patient has no known allergies.   History: Past Medical History:  Diagnosis Date   Arthritis    BMI 39.0-39.9,adult    Diabetes (HCC)    on Wegovy    Endometrial cancer (HCC) 09/2021   sx and radiation   Family history of pancreatic cancer    Family history of peritoneal cancer    History of radiation therapy    Endometrium- HDR 12/16/21-01/15/20- Dr. Lynwood Nasuti   Hypertension    PONV (postoperative nausea and vomiting)    Sleep apnea    C PAP   Past Surgical History:  Procedure Laterality Date   CHOLECYSTECTOMY  05/18/2002   COLONOSCOPY  02/24/2013   DB-MAC-moviprep (exc)-normal-10 yr recall   LYMPH NODE BIOPSY N/A 10/22/2021   Procedure: Sentinel LYMPH NODE BIOPSY;  Surgeon: Viktoria Comer SAUNDERS, MD;  Location: [facility];  Service: Gynecology;  Laterality: N/A;   ROBOTIC ASSISTED TOTAL HYSTERECTOMY WITH BILATERAL SALPINGO OOPHERECTOMY Bilateral 10/22/2021   Procedure: XI ROBOTIC ASSISTED TOTAL HYSTERECTOMY WITH BILATERAL SALPINGO OOPHORECTOMY;  Surgeon: Viktoria Comer SAUNDERS, MD;  Location: [facility];  Service: Gynecology;  Laterality: Bilateral;   Family History  Problem Relation Age of Onset   Cancer Mother 23       primary peritoneal cancer   Alzheimer's disease Mother    Heart disease Father    Pancreatic cancer Father 41   Hypertension Brother    Heart attack Brother 59       MI x 2   Breast cancer Paternal Aunt 38   Diabetes Paternal Grandmother    Colon cancer Neg Hx     Prostate cancer Neg Hx    Ovarian cancer Neg Hx    Colon polyps Neg Hx    Esophageal cancer Neg Hx    Rectal cancer Neg Hx    Stomach cancer Neg Hx    Social History   Socioeconomic History   Marital status: Married    Spouse name: Not on file   Number of children: Not on file   Years of education: Not on file   Highest education level: Not on file  Occupational History   Occupation: retired Magazine features editor: RETIRED  Tobacco Use   Smoking status: Never   Smokeless tobacco: Never  Vaping Use   Vaping status: Never Used  Substance and Sexual Activity   Alcohol use: Yes    Alcohol/week: 5.0 standard drinks of alcohol    Types: 5 Standard drinks or equivalent per week    Comment: occas   Drug use: No   Sexual activity: Not Currently    Birth control/protection: Post-menopausal    Comment: 1st intercourse 73 yo.---Fewer than 5 partners  Other Topics Concern   Not on file  Social History Narrative   Retired from being a Engineer, site.    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 years    0 children    Social Drivers of Corporate investment banker Strain: Low Risk  (01/26/2024)   Overall Financial Resource Strain (CARDIA)    Difficulty of Paying Living Expenses: Not hard at all  Food Insecurity: No Food Insecurity (01/26/2024)   Hunger Vital Sign    Worried About Running Out of Food in the Last Year: Never true    Ran Out of Food in the Last Year: Never true  Transportation Needs: No Transportation Needs (01/26/2024)   PRAPARE - Administrator, Civil Service (Medical): No    Lack of Transportation (Non-Medical): No  Physical Activity: Inactive (01/26/2024)   Exercise Vital Sign    Days of Exercise per Week: 0 days    Minutes of Exercise per Session: 0 min  Stress: No Stress Concern Present (01/26/2024)   [employer] of Occupational Health - Occupational Stress Questionnaire    Feeling of Stress: Not at all  Social Connections: Socially Integrated (01/26/2024)   Social  Connection and Isolation Panel    Frequency of Communication with Friends and Family: More than three times a week    Frequency of Social Gatherings with Friends and Family: More than three times a week    Attends Religious Services: More than 4 times per year    Active Member of [employer] or Organizations: Yes    Attends Engineer, structural: More than 4 times per year    Marital Status: Married    Tobacco Counseling  Counseling given: Not Answered    Clinical Intake:  Pre-visit preparation completed: Yes  Pain : No/denies pain     BMI - recorded: 41.76 Nutritional Status: BMI > 30  Obese Nutritional Risks: None Diabetes: No  Lab Results  Component Value Date   HGBA1C 6.4 12/14/2023   HGBA1C 5.9 (A) 03/31/2023   HGBA1C 6.4 04/01/2022     How often do you need to have someone help you when you read instructions, pamphlets, or other written materials from your doctor or pharmacy?: 1 - Never  Interpreter Needed?: No  Information entered by :: Rojelio Blush LPN   Activities of Daily Living     01/26/2024    8:54 AM  In your present state of health, do you have any difficulty performing the following activities:  Hearing? 0  Vision? 0  Difficulty concentrating or making decisions? 0  Walking or climbing stairs? 0  Dressing or bathing? 0  Doing errands, shopping? 0  Preparing Food and eating ? N  Using the Toilet? N  In the past six months, have you accidently leaked urine? N  Do you have problems with loss of bowel control? N  Managing your Medications? N  Managing your Finances? N  Housekeeping or managing your Housekeeping? N    Patient Care Team: Merna Huxley, NP as PCP - General (Family Medicine)  I have updated your Care Teams any recent Medical Services you may have received from other providers in the past year.     Assessment:   This is a routine wellness examination for Andrea.  Hearing/Vision screen Hearing Screening - Comments::  Denies hearing difficulties   Vision Screening - Comments:: Wears rx glasses - up to date with routine eye exams with Dr Glendia    Goals Addressed               This Visit's Progress     Increase physical activity (pt-stated)        Lose weight.       Depression Screen     01/26/2024    8:42 AM 01/22/2023    8:49 AM 11/26/2022    9:36 AM 01/13/2022    9:08 AM 04/08/2021    1:54 PM 12/18/2020    2:10 PM 12/25/2019   10:41 AM  PHQ 2/9 Scores  PHQ - 2 Score 0 0 0 0 0 0 0  PHQ- 9 Score  0 0  2  0    Fall Risk     01/26/2024    8:55 AM 01/22/2023    9:05 AM 01/13/2022    9:07 AM 04/08/2021    1:54 PM 12/18/2020    2:10 PM  Fall Risk   Falls in the past year? 1 0 0 0 0  Number falls in past yr: 0 0 0 0 0  Injury with Fall? 0 0 0 0 0  Risk for fall due to : No Fall Risks No Fall Risks Medication side effect    Follow up Falls evaluation completed Falls prevention discussed Falls evaluation completed;Education provided;Falls prevention discussed   Falls evaluation completed      Data saved with a previous flowsheet row definition    MEDICARE RISK AT HOME:  Medicare Risk at Home Any stairs in or around the home?: No If so, are there any without handrails?: No Home free of loose throw rugs in walkways, pet beds, electrical cords, etc?: Yes Adequate lighting in your home to reduce risk of falls?: Yes Life alert?:  No Use of a cane, walker or w/c?: No Grab bars in the bathroom?: Yes Shower chair or bench in shower?: No Elevated toilet seat or a handicapped toilet?: No  TIMED UP AND GO:  Was the test performed?  Yes  Length of time to ambulate 10 feet: 10 sec Gait steady and fast without use of assistive device  Cognitive Function: 6CIT completed        01/26/2024    8:56 AM 01/22/2023    9:08 AM 01/13/2022    9:09 AM  6CIT Screen  What Year? 0 points 0 points 0 points  What month? 0 points 0 points 0 points  What time? 0 points 0 points 0 points  Count back from 20 0  points 0 points 0 points  Months in reverse 0 points 0 points 2 points  Repeat phrase 0 points 0 points 4 points  Total Score 0 points 0 points 6 points    Immunizations Immunization History  Administered Date(s) Administered   INFLUENZA, HIGH DOSE SEASONAL PF 02/28/2015, 02/20/2016, 03/03/2017, 03/08/2018, 03/17/2019   Influenza Split 02/25/2012, 03/18/2013, 02/02/2014   Moderna SARS-COV2 Booster Vaccination 05/10/2020   Moderna Sars-Covid-2 Vaccination 07/22/2019, 08/22/2019   Pneumococcal Conjugate-13 02/28/2015   Pneumococcal Polysaccharide-23 02/20/2016   Td 05/19/2003   Tdap 09/13/2013   Zoster Recombinant(Shingrix) 06/25/2018, 11/06/2018   Zoster, Live 12/15/2010    Screening Tests Health Maintenance  Topic Date Due   Diabetic kidney evaluation - Urine ACR  Never done   COVID-19 Vaccine (3 - Moderna risk series) 06/07/2020   DTaP/Tdap/Td (3 - Td or Tdap) 09/14/2023   Influenza Vaccine  12/17/2023   MAMMOGRAM  05/16/2024   HEMOGLOBIN A1C  06/15/2024   OPHTHALMOLOGY EXAM  08/16/2024   Diabetic kidney evaluation - eGFR measurement  12/13/2024   Medicare Annual Wellness (AWV)  01/25/2025   Colonoscopy  04/19/2033   Pneumococcal Vaccine: 50+ Years  Completed   DEXA SCAN  Completed   Hepatitis C Screening  Completed   Zoster Vaccines- Shingrix  Completed   HPV VACCINES  Aged Out   Meningococcal B Vaccine  Aged Out   FOOT EXAM  Discontinued    Health Maintenance Items Addressed:   Additional Screening:  Vision Screening: Recommended annual ophthalmology exams for early detection of glaucoma and other disorders of the eye. Is the patient up to date with their annual eye exam?  Yes  Who is the provider or what is the name of the office in which the patient attends annual eye exams? Dr Glendia  Dental Screening: Recommended annual dental exams for proper oral hygiene  Community Resource Referral / Chronic Care Management: CRR required this visit?  No   CCM required  this visit?  No   Plan:    I have personally reviewed and noted the following in the patient's chart:   Medical and social history Use of alcohol, tobacco or illicit drugs  Current medications and supplements including opioid prescriptions. Patient is not currently taking opioid prescriptions. Functional ability and status Nutritional status Physical activity Advanced directives List of other physicians Hospitalizations, surgeries, and ER visits in previous 12 months Vitals Screenings to include cognitive, depression, and falls Referrals and appointments  In addition, I have reviewed and discussed with patient certain preventive protocols, quality metrics, and best practice recommendations. A written personalized care plan for preventive services as well as general preventive health recommendations were provided to patient.   Rojelio LELON Blush, LPN   [id number]   After Visit Summary: (  In Person-Printed) AVS printed and given to the patient  Notes: Nothing significant to report at this time.

## 2024-01-26 NOTE — Patient Instructions (Addendum)
 Kylie Davis,  Thank you for taking the time for your Medicare Wellness Visit. I appreciate your continued commitment to your health goals. Please review the care plan we discussed, and feel free to reach out if I can assist you further.  Medicare recommends these wellness visits once per year to help you and your care team stay ahead of potential health issues. These visits are designed to focus on prevention, allowing your provider to concentrate on managing your acute and chronic conditions during your regular appointments.  Please note that Annual Wellness Visits do not include a physical exam. Some assessments may be limited, especially if the visit was conducted virtually. If needed, we may recommend a separate in-person follow-up with your provider.  Ongoing Care Seeing your primary care provider every 3 to 6 months helps us  monitor your health and provide consistent, personalized care.   Referrals If a referral was made during today's visit and you haven't received any updates within two weeks, please contact the referred provider directly to check on the status.  Recommended Screenings:  Health Maintenance  Topic Date Due   Yearly kidney health urinalysis for diabetes  Never done   COVID-19 Vaccine (3 - Moderna risk series) 06/07/2020   DTaP/Tdap/Td vaccine (3 - Td or Tdap) 09/14/2023   Flu Shot  12/17/2023   Mammogram  05/16/2024   Hemoglobin A1C  06/15/2024   Eye exam for diabetics  08/16/2024   Yearly kidney function blood test for diabetes  12/13/2024   Medicare Annual Wellness Visit  01/25/2025   Colon Cancer Screening  04/19/2033   Pneumococcal Vaccine for age over 20  Completed   DEXA scan (bone density measurement)  Completed   Hepatitis C Screening  Completed   Zoster (Shingles) Vaccine  Completed   HPV Vaccine  Aged Out   Meningitis B Vaccine  Aged Out   Complete foot exam   Discontinued       01/26/2024    8:56 AM  Advanced Directives  Does Patient Have a  Medical Advance Directive? Yes  Type of Estate agent of [place];Living will  Copy of Healthcare Power of Attorney in Chart? No - copy requested   Advance Care Planning is important because it: Ensures you receive medical care that aligns with your values, goals, and preferences. Provides guidance to your family and loved ones, reducing the emotional burden of decision-making during critical moments.  Vision: Annual vision screenings are recommended for early detection of glaucoma, cataracts, and diabetic retinopathy. These exams can also reveal signs of chronic conditions such as diabetes and high blood pressure.  Dental: Annual dental screenings help detect early signs of oral cancer, gum disease, and other conditions linked to overall health, including heart disease and diabetes.  Please see the attached documents for additional preventive care recommendations.

## 2024-02-02 ENCOUNTER — Ambulatory Visit (HOSPITAL_BASED_OUTPATIENT_CLINIC_OR_DEPARTMENT_OTHER): Admitting: Physical Therapy

## 2024-02-02 ENCOUNTER — Encounter

## 2024-02-04 ENCOUNTER — Ambulatory Visit: Admitting: Physical Therapy

## 2024-02-15 ENCOUNTER — Encounter: Admitting: Physical Therapy

## 2024-02-16 ENCOUNTER — Ambulatory Visit (HOSPITAL_BASED_OUTPATIENT_CLINIC_OR_DEPARTMENT_OTHER): Admitting: Physical Therapy

## 2024-02-16 DIAGNOSIS — M25561 Pain in right knee: Secondary | ICD-10-CM | POA: Diagnosis not present

## 2024-02-16 DIAGNOSIS — M25562 Pain in left knee: Secondary | ICD-10-CM | POA: Diagnosis not present

## 2024-02-21 NOTE — Progress Notes (Signed)
 @Patient  ID: Kylie Davis, female    DOB: January 10, 1951,    MRN: [id number]  Referring provider: Merna Huxley, NP  HPI: female  smoker followed for obstructive sleep apnea Retired Chartered loss adjuster  TEST/EVENTS :  HST 10/2013:  AHI 25/hr.  ============================================================================================== 02/23/23- 72 yoF never smoker followed for OSA, complicated by HTN, Allergic Rhinitis, DM, Endometrial Cancer, Obesity, CPAP auto 5-20    AirSense 10  Download compliance-97%, AHI 2.5/hr Body weight today-239 lbs We discussed CPAP comfort and options. She could be referred for oral appliance if she wants.  02/22/24- 73 yoF never smoker followed for OSA, complicated by HTN, Allergic Rhinitis, DM, Endometrial Cancer, Obesity, CPAP auto 5-20  Adapt   AirSense 10  Download compliance- 63%, AHI 2.9/hr    Many short nights Body weight today- -----Not sleeping well due to knee injury Discussed the use of AI scribe software for clinical note transcription with the patient, who gave verbal consent to proceed.  History of Present Illness   Kylie Davis is a 73 year old female with sleep apnea who presents with leg pain impacting her sleep.  She experiences severe leg pain that disrupts her sleep, causing her to wake up at night. She recently completed a course of prednisone  and uses over-the-counter Voltaren for pain management. Edema is present in her ankles, which she is advised to  manage by elevating her feet.  She has sleep apnea, controlled with a CPAP machine, achieving less than three apneas per hour. However, leg pain affects her sleep quality, with sleep data showing nights of less than four hours of sleep. She is considering a mouthpiece for sleep apnea management due to the inconvenience of traveling with a CPAP machine.  She denies any current breathing difficulties.     Assessment and Plan:    Obstructive sleep apnea Sleep apnea  well-controlled with CPAP. Leg pain affects sleep quality. Interested in mouthpiece for travel. - Refer to specialist for mouthpiece discussion. - Discuss travel CPAP machine option. - Advise melatonin (3-5 mg) and OTC sleep aids for occasional disturbances.  Peripheral edema Edema in ankles likely increased by prednisone  use. - Advise leg elevation when sitting. - Monitor edema resolution post-prednisone .         ROS-see HPI   + = positive Constitutional:    weight loss, night sweats, fevers, chills, fatigue, lassitude. HEENT:    headaches, difficulty swallowing, tooth/dental problems, sore throat,       sneezing, itching, ear ache, nasal congestion, post nasal drip, snoring CV:    chest pain, orthopnea, PND, swelling in lower extremities, anasarca,                                   dizziness, palpitations Resp:   shortness of breath with exertion or at rest.                productive cough,   non-productive cough, coughing up of blood.              change in color of mucus.  wheezing.   Skin:    rash or lesions. GI:  No-   heartburn, indigestion, abdominal pain, nausea, vomiting, diarrhea,                 change in bowel habits, loss of appetite GU: dysuria, change in color of urine, no urgency or frequency.   flank pain. MS:   joint pain,  stiffness, decreased range of motion, back pain. Neuro-     nothing unusual Psych:  change in mood or affect.  depression or anxiety.   memory loss.  OBJ- Physical Exam +obese General- Alert, Oriented, Affect-appropriate, Distress- none acute Skin- rash-none, lesions- none, excoriation- none Lymphadenopathy- none Head- atraumatic            Eyes- Gross vision intact, PERRLA, conjunctivae and secretions clear            Ears- Hearing, canals-normal            Nose- Clear, no-Septal dev, mucus, polyps, erosion, perforation             Throat- Mallampati II , mucosa clear , drainage- none, tonsils- atrophic Neck- flexible , trachea midline,  no stridor , thyroid  nl, carotid no bruit Chest - symmetrical excursion , unlabored           Heart/CV- RRR , no murmur , no gallop  , no rub, nl s1 s2                           - JVD- none , edema- none, stasis changes- none, varices- none           Lung- clear to P&A, wheeze- none, cough- none , dullness-none, rub- none           Chest wall-  Abd-  Br/ Gen/ Rectal- Not done, not indicated Extrem- cyanosis- none, clubbing, none, atrophy- none, strength- nl Neuro- grossly intact to observation

## 2024-02-22 ENCOUNTER — Ambulatory Visit: Admitting: Physical Therapy

## 2024-02-22 ENCOUNTER — Ambulatory Visit: Payer: Medicare PPO | Admitting: Internal Medicine

## 2024-02-22 ENCOUNTER — Encounter: Payer: Self-pay | Admitting: Internal Medicine

## 2024-02-22 VITALS — BP 118/82 | HR 69 | Temp 98.1°F | Ht 64.0 in | Wt 246.4 lb

## 2024-02-22 DIAGNOSIS — R600 Localized edema: Secondary | ICD-10-CM | POA: Diagnosis not present

## 2024-02-22 DIAGNOSIS — G4733 Obstructive sleep apnea (adult) (pediatric): Secondary | ICD-10-CM

## 2024-02-22 NOTE — Patient Instructions (Signed)
 We can continue CPAP auto 5-20   Order- referral to Dr Oneil Forget, Orthodontics, consider oral appliance for OSA

## 2024-02-22 NOTE — Progress Notes (Signed)
 Radiation Oncology         [phone]) [phone] ________________________________  Name: Kylie Davis MRN: [id number]  Date: 02/24/2024  DOB: [birth year]  Follow-Up Visit Note  CC: Merna Huxley, NP  Merna Huxley, NP  No diagnosis found.  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 She was last seen here for follow-up on 08/26/23. Patient continued to follow up with their specialists to manage their chronic conditions.   In the interval since she was last seen, she underwent a CT a/p on 10/25/23 showing evidence for malignancy within the abdomen or pelvis. Scan did shows resolution of area concerning for diverticulitis   She presented for a follow up visit with Dr. Viktoria on 11/11/23 during which she was noted NED on exam with no symptoms or concerns indication disease recurrence.                     No other significant oncologic interval history since the patient was last seen.                              Allergies:  has no known allergies.  Meds: Current Outpatient Medications  Medication Sig Dispense Refill   acetaminophen  (TYLENOL ) 500 MG tablet Take 500-1,000 mg by mouth every 6 (six) hours as needed (pain.).     aspirin EC 81 MG tablet Take 81 mg by mouth in the morning.     Cholecalciferol (VITAMIN D3 PO) Take 1 tablet by mouth daily.     Continuous Glucose Sensor (FREESTYLE LIBRE 3 PLUS SENSOR) MISC Change sensor every 15 days. 2 each 6   ibuprofen  (ADVIL ) 600 MG tablet Take 1 tablet (600 mg total) by mouth every 6 (six) hours as needed for moderate pain. For AFTER surgery only 30 tablet 0   losartan -hydrochlorothiazide  (HYZAAR)  50-12.5 MG tablet TAKE 1 TABLET BY MOUTH DAILY 90 tablet 1   semaglutide -weight management (WEGOVY ) 0.5 MG/0.5ML SOAJ SQ injection Inject 0.5 mg into the skin once a week. (Patient not taking: Reported on 02/22/2024) 2 mL 0   No current facility-administered medications for this visit.    Physical Findings: The patient is in no acute distress. Patient is alert and oriented.  vitals were not taken for this visit. .  No significant changes. Lungs are clear to auscultation bilaterally. Heart has regular rate and rhythm. No palpable cervical, supraclavicular, or axillary adenopathy. Abdomen soft, non-tender, normal bowel sounds.   Lab Findings: Lab Results  Component Value Date   WBC 5.4 09/06/2023   HGB 13.1 09/06/2023   HCT 39.7 09/06/2023   MCV 87.1 09/06/2023   PLT 258 09/06/2023    Radiographic Findings: No results found.  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  ***  Plan:  ***   *** minutes of total time was spent for this patient encounter, including preparation, face-to-face counseling with the patient and coordination of care, physical exam, and documentation of the encounter. ____________________________________  Lynwood CHARM Nasuti, PhD, MD  This document serves as a record of services  personally performed by Lynwood Nasuti, MD. It was created on his behalf by Reymundo Cartwright, a trained medical scribe. The creation of this record is based on the scribe's personal observations and the provider's statements to them. This document has been checked and approved by the attending provider.

## 2024-02-23 ENCOUNTER — Ambulatory Visit: Admitting: Physical Therapy

## 2024-02-24 ENCOUNTER — Encounter: Payer: Self-pay | Admitting: Radiation Oncology

## 2024-02-24 ENCOUNTER — Ambulatory Visit
Admission: RE | Admit: 2024-02-24 | Discharge: 2024-02-24 | Disposition: A | Discharge status: Home / Self Care | Source: Ambulatory Visit | Attending: Radiation Oncology | Admitting: Radiation Oncology

## 2024-02-24 DIAGNOSIS — G541 Lumbosacral plexus disorders: Secondary | ICD-10-CM | POA: Diagnosis not present

## 2024-02-24 DIAGNOSIS — C541 Malignant neoplasm of endometrium: Secondary | ICD-10-CM

## 2024-02-24 DIAGNOSIS — Z79899 Other long term (current) drug therapy: Secondary | ICD-10-CM | POA: Insufficient documentation

## 2024-02-24 DIAGNOSIS — Z8542 Personal history of malignant neoplasm of other parts of uterus: Secondary | ICD-10-CM | POA: Diagnosis not present

## 2024-02-24 DIAGNOSIS — Z923 Personal history of irradiation: Secondary | ICD-10-CM | POA: Insufficient documentation

## 2024-02-24 NOTE — Progress Notes (Signed)
 Kylie Davis is here today for follow up post radiation to the pelvic.  They completed their radiation on: 01/15/24   Does the patient complain of any of the following:  Pain: No Abdominal bloating: yes Diarrhea/Constipation: No Nausea/Vomiting: No Vaginal Discharge: No Blood in Urine or Stool: No Urinary Issues (dysuria/incomplete emptying/ incontinence/ increased frequency/urgency): No Does patient report using vaginal dilator 2-3 times a week and/or sexually active 2-3 weeks: Patient uses dilators at times. Post radiation skin changes: No   Additional comments if applicable: Patient reports feeling a heaviness to legs.   BP (!) (P) 157/78 (BP Location: Left Arm, Patient Position: Sitting)   Pulse (P) 79   Temp (!) (P) 96.8 F (36 C) (Temporal)   Resp (P) 18   Ht (P) 5' 4 (1.626 m)   Wt (P) 244 lb 6 oz (110.8 kg)   BMI (P) 41.95 kg/m

## 2024-02-25 ENCOUNTER — Telehealth: Payer: Self-pay | Admitting: Radiation Oncology

## 2024-02-25 NOTE — Telephone Encounter (Signed)
 10/10 Faxed Outgoing Referral to [facility] - Physical Therapy.

## 2024-02-26 ENCOUNTER — Encounter: Payer: Self-pay | Admitting: Internal Medicine

## 2024-02-29 ENCOUNTER — Telehealth: Payer: Self-pay | Admitting: *Deleted

## 2024-02-29 ENCOUNTER — Encounter: Admitting: Physical Therapy

## 2024-02-29 NOTE — Telephone Encounter (Signed)
 CMN received for CPAP.  She was last seen by Dr. Neysa 02/22/2024.  Visit printed and faxed to [facility] [phone].  Nothing further needed.

## 2024-03-01 ENCOUNTER — Ambulatory Visit: Attending: Radiology | Admitting: Physical Therapy

## 2024-03-01 ENCOUNTER — Other Ambulatory Visit: Payer: Self-pay

## 2024-03-01 DIAGNOSIS — M5459 Other low back pain: Secondary | ICD-10-CM | POA: Diagnosis not present

## 2024-03-01 DIAGNOSIS — C541 Malignant neoplasm of endometrium: Secondary | ICD-10-CM | POA: Insufficient documentation

## 2024-03-01 DIAGNOSIS — M62838 Other muscle spasm: Secondary | ICD-10-CM | POA: Insufficient documentation

## 2024-03-01 DIAGNOSIS — R293 Abnormal posture: Secondary | ICD-10-CM | POA: Insufficient documentation

## 2024-03-01 DIAGNOSIS — M6281 Muscle weakness (generalized): Secondary | ICD-10-CM | POA: Diagnosis not present

## 2024-03-01 NOTE — Patient Instructions (Addendum)
 Moisturizers They are used in the vagina to hydrate the mucous membrane that make up the vaginal canal. Designed to keep a more normal acid balance (ph) Once placed in the vagina, it will last between two to three days.  Use 2-3 times per week at bedtime  Ingredients to avoid is glycerin and fragrance, can increase chance of infection Should not be used just before sex due to causing irritation Most are gels administered either in a tampon-shaped applicator or as a vaginal suppository. They are non-hormonal.   Types of Moisturizers(internal use)  Vitamin E vaginal suppositories- Whole foods, Amazon Moist Again Coconut oil- can break down condoms, any grocery store (prefer organic) Julva- (Do no use if taking  Tamoxifen) amazon Yes moisturizer- amazon NeuEve Silk , NeuEve Silver for menopausal or over 65 (if have severe vaginal atrophy or cancer treatments use NeuEve Silk for  1 month than move to [employer])- [employer], Greenhorn.com Olive and Bee intimate cream- www.oliveandbee.com.au Mae vaginal moisturizer- Amazon Aloe Good Clean Love Hyaluronic acid Hyalofemme Reveree hyaluronic acid inserts   Creams to use externally on the Vulva area Marathon Oil (good for for cancer patients that had radiation to the area)- guam or Newell Rubbermaid.https://garcia-valdez.org/ Vulva Balm/ V-magic cream by medicine mama- amazon Julva-amazon Vital V Wild Yam salve ( help moisturize and help with thinning vulvar area, does have Beeswax MoodMaid Botanical Pro-Meno Wild Yam Cream- Amazon Desert Harvest Gele Cleo by Sherrlyn labial moisturizer (Amazon),  Coconut or olive oil aloe Good Clean Love Enchanted Rose by intimate rose  Things to avoid in the vaginal area Do not use things to irritate the vulvar area No lotions just specialized creams for the vulva area- Neogyn, V-magic,  No soaps; can use Aveeno or Calendula cleanser, unscented Dove if needed. Must be gentle No deodorants No douches Good to  sleep without underwear to let the vaginal area to air out No scrubbing: spread the lips to let warm water  rinse over labias and pat dry    Lubrication Used for intercourse to reduce friction Avoid ones that have glycerin, nonoxynol-9, petroleum, propylene glycol, chlorhexidine  gluconate, warming gels, tingling gels, icing or cooling gel, scented Avoid parabens due to a preservative similar to female sex hormone May need to be reapplied once or several times during sexual activity Can be applied to both partners genitals prior to vaginal penetration to minimize friction or irritation Prevent irritation and mucosal tears that cause post coital pain and increased the risk of vaginal and urinary tract infections Oil-based lubricants cannot be used with condoms due to breaking them down.  Least likely to irritate vaginal tissue.  Plant based-lubes are safe Silicone-based lubrication are thicker and last long and used for post-menopausal women  Vaginal Lubricators Here is a list of some suggested lubricators you can use for intercourse. Use the most hypoallergenic product.  You can place on you or your partner.  Slippery Stuff ( water  based) Sylk or Sliquid Natural H2O ( good  if frequent UTI's)- walmart, amazon Sliquid organics silk-(aloe and silicone based ) Blossom Organics (www.blossom-organics.com)- (aloe based ) Coconut oil, olive oil -not good with condoms  PJur Woman Nude- (water  based) amazon Uberlube- ( silicon) Amazon Aloe Vera- Sprouts has an organic one Yes lubricant- (water  based and has plant oil based similar to silicone) Loews Corporation Platinum-Silicone, Target, Walgreens Olive and Bee intimate cream-  www.oliveandbee.com.au Pink - International Paper Erosense Sync- walmart, amazon Coconu- coconu.com Desert Hallibur[employer] Company Good Clean Love lubricants  Things to avoid in  lubricants are glycerin, warming gels, tingling gels, icing or cooling  gels, and scented gels.  Also avoid  Vaseline. KY jelly,  and Astroglide contain chlorhexidine  which kills good bacteria(lactobacilli)  Things to avoid in the vaginal area Do not use things to irritate the vulvar area No lotions- see below Soaps you  can use :Aveeno, Calendula, Good Clean Love cleanser if needed. Must be gentle No deodorants No douches Good to sleep without underwear to let the vaginal area to air out No scrubbing: spread the lips to let warm water  rinse over labias and pat dry  Creams that can be used on the Vulva Area V [employer] Group, walmart Vital V Wild Yam Salve Julva- Amazon MoonMaid Botanical Pro-Meno Wild Yam Cream Coconut oil, olive oil Cleo by [employer] labial moisturizer -Amazon,  Desert CIGNA ( lidocaine ) or Desert Harvest Gele Yes Moisturizer    Urge Incontinence  Ideal urination frequency is every 2-4 wakeful hours, which equates to 5-8 times within a 24-hour period.   Urge incontinence is leakage that occurs when the bladder muscle contracts, creating a sudden need to go before getting to the bathroom.   Going too often when your bladder isn't actually full can disrupt the body's automatic signals to store and hold urine longer, which will increase urgency/frequency.  In this case, the bladder "is running the show" and strategies can be learned to retrain this pattern.   One should be able to control the first urge to urinate, at around .  The bladder can hold up to a "grande latte," or . To help you gain control, practice the Urge Drill below when urgency strikes.  This drill will help retrain your bladder signals and allow you to store and hold urine longer.  The overall goal is to stretch out your time between voids to reach a more manageable voiding schedule.    Practice your quick flicks often throughout the day (each waking hour) even when you don't need feel the urge to go.  This will help strengthen your pelvic floor muscles, making them more effective in controlling  leakage.  Urge Drill  When you feel an urge to go, follow these steps to regain control: Stop what you are doing and be still Take one deep breath, directing your air into your abdomen Think an affirming thought, such as "I've got this." Do 5 quick flicks of your pelvic floor Walk with control to the bathroom to void, or delay voiding

## 2024-03-01 NOTE — Therapy (Signed)
 OUTPATIENT PHYSICAL THERAPY FEMALE PELVIC EVALUATION   Patient Name: Kylie Davis MRN: [id number] DOB:15-Oct-1950, 73 y.o., female Today's Date: 03/01/2024  END OF SESSION:  PT End of Session - 03/01/24 1231     Visit Number 1    Date for Recertification  06/01/24    Authorization Type [employer]    Progress Note Due on Visit 10    PT Start Time 1228    PT Stop Time 1309    PT Time Calculation (min) 41 min    Activity Tolerance Patient tolerated treatment well    Behavior During Therapy [facility] for tasks assessed/performed          Past Medical History:  Diagnosis Date   Arthritis    BMI 39.0-39.9,adult    Diabetes (HCC)    on Wegovy    Endometrial cancer (HCC) 09/2021   sx and radiation   Family history of pancreatic cancer    Family history of peritoneal cancer    History of radiation therapy    Endometrium- HDR 12/16/21-01/15/20- Dr. Lynwood Nasuti   Hypertension    PONV (postoperative nausea and vomiting)    Sleep apnea    C PAP   Past Surgical History:  Procedure Laterality Date   CHOLECYSTECTOMY  05/18/2002   COLONOSCOPY  02/24/2013   DB-MAC-moviprep (exc)-normal-10 yr recall   LYMPH NODE BIOPSY N/A 10/22/2021   Procedure: Sentinel LYMPH NODE BIOPSY;  Surgeon: Viktoria Comer SAUNDERS, MD;  Location: [facility];  Service: Gynecology;  Laterality: N/A;   ROBOTIC ASSISTED TOTAL HYSTERECTOMY WITH BILATERAL SALPINGO OOPHERECTOMY Bilateral 10/22/2021   Procedure: XI ROBOTIC ASSISTED TOTAL HYSTERECTOMY WITH BILATERAL SALPINGO OOPHORECTOMY;  Surgeon: Viktoria Comer SAUNDERS, MD;  Location: [facility];  Service: Gynecology;  Laterality: Bilateral;   Patient Active Problem List   Diagnosis Date Noted   Genetic testing 01/02/2022   Family history of peritoneal cancer 12/10/2021   Family history of pancreatic cancer 12/10/2021   Body mass index (BMI) 40.0-44.9, adult (HCC) 10/22/2021   Endometrial cancer (HCC) 10/06/2021   Obesity, Class II, BMI 35-39.9 09/10/2015   Family history of  malignant neoplasm of ovary 01/18/2014   Family history of malignant neoplasm of breast 01/18/2014   Family history of malignant neoplasm of gastrointestinal tract 01/18/2014   OSA (obstructive sleep apnea) 10/10/2013   Sleep apnea 09/13/2013   Allergic rhinitis 09/06/2012   Essential hypertension, benign 09/06/2012    PCP:    Merna Huxley, NP    REFERRING PROVIDER: Wyatt Leeroy HERO, PA-C  REFERRING DIAG: C54.1 (ICD-10-CM) - Endometrial cancer (HCC)  THERAPY DIAG:  Muscle weakness (generalized)  Other low back pain  Abnormal posture  Other muscle spasm  Rationale for Evaluation and Treatment: Rehabilitation  ONSET DATE: 2 years ago  SUBJECTIVE:  SUBJECTIVE STATEMENT: Increased urinary incontinence recently with urge (worse in the morning with first void), also has leakage with stressors. Biggest concern today was she hates her vaginal dilator post radiation and wants information on different options.   Fluid intake:   FUNCTIONAL LIMITATIONS: none at the time  PERTINENT HISTORY:  Endometrial cancer, CHOLECYSTECTOMY, BILATERAL SALPINGO OOPHERECTOMY,  Other:  Sexual abuse: No  DIAGNOSTIC FINDINGS:  Post-void residual: Voiding Cystourethrogram (VCUG):  Ultrasound: PAIN:  Are you having pain? No   PRECAUTIONS: None  RED FLAGS: None   WEIGHT BEARING RESTRICTIONS: No  FALLS:  Has patient fallen in last 6 months? No  OCCUPATION: retired   ACTIVITY LEVEL : low  PLOF: Independent  PATIENT GOALS: to have more comfort with dilator use and have less urinary incontinence    BOWEL MOVEMENT: Pain with bowel movement: No Type of bowel movement:Type (Bristol Stool Scale) 4-5, Frequency daily to every other, and Strain no Fully empty rectum: Yes:   Leakage: No                                                      Caused by:  Pads: No Fiber supplement/laxative No  URINATION: Pain with urination: No Fully empty bladder: Yes:                                  Post-void dribble: No Stream: Strong Urgency: sometimes Frequency:during the day every 1.5-2 hours sometimes a little longer if lower fluid intake                                                         Nocturia: No   Leakage: Urge to void, Walking to the bathroom, Coughing, Sneezing, and Laughing Pads/briefs: No  INTERCOURSE:  Ability to have vaginal penetration Yes  Pain with intercourse: none but lower frequency  Dryness: No Climax: yes Marinoff Scale: 0/3 Lubricant:water  based  PREGNANCY: needs to be asked Vaginal deliveries  Tearing  Episiotomy  C-section deliveries  Currently pregnant   PROLAPSE: None   OBJECTIVE:  Note: Objective measures were completed at Evaluation unless otherwise noted.  DIAGNOSTIC FINDINGS:    PATIENT SURVEYS:    PFIQ-7: 29 bladder  COGNITION: Overall cognitive status: Within functional limits for tasks assessed     SENSATION: Light touch: Appears intact   FUNCTIONAL TESTS:   Single leg stance: mild hip drop bil  Rt:  Lt: Sit-up test:1/3 Squat: Bed mobility:  GAIT: WFL  POSTURE: rounded shoulders, forward head, and posterior pelvic tilt   LUMBARAROM/PROM:  A/PROM A/PROM  Eval (% available)  Flexion 100  Extension 100  Right lateral flexion 75  Left lateral flexion 75  Right rotation 75  Left rotation 75   (Blank rows = not tested)  LOWER EXTREMITY ROM:  Bil hamstrings and adductors limited by 25%  LOWER EXTREMITY MMT:  Hip hips grossly 4/5 PALPATION:  General: tightness in lumbar paraspinals  Pelvic Alignment: WFL  Abdominal: mild tension in lower quadrants   Breathing: chest  External Perineal Exam: deferred                             Internal Pelvic Floor: deferred   Patient confirms identification  and approves PT to assess internal pelvic floor and treatment No  PELVIC MMT:   MMT eval  Vaginal   Internal Anal Sphincter   External Anal Sphincter   Puborectalis   Diastasis Recti   (Blank rows = not tested)        TONE: Deferred   PROLAPSE: Deferred   TODAY'S TREATMENT:                                                                                                                              DATE:   03/01/24 EVAL Examination completed, findings reviewed, pt educated on POC, HEP, and extra time for education on vaginal dilators as this was pt's main reason for coming to evaluation, possible benefits of vibrating wand for tissue mobility, pelvic floor PT role in care and purpose, urge drill. Pt motivated to participate in PT and agreeable to attempt recommendations.     PATIENT EDUCATION:  Education details: see above Person educated: Patient Education method: Explanation, Demonstration, Tactile cues, Verbal cues, and Handouts Education comprehension: verbalized understanding, returned demonstration, verbal cues required, tactile cues required, and needs further education  HOME EXERCISE PROGRAM: To be given  ASSESSMENT:  CLINICAL IMPRESSION: Patient is a 73 y.o. female  who was seen today for physical therapy evaluation and treatment for status post endometrial cancer, urinary incontinence and does use vaginal dilators but just reports she does not like it at all and has a poor experience with them and wants more information on other options. Pt found to have impaired posture, decreased core and hip strength, mild decreased flexibility in spine and bil hips. Deferred pelvic assessment due to time as pt requests to review vaginal dilators. Sine her radiation she has used dilators and at her med-large size but hates them. Educated on dilators and possible vibration option given to pt and pt denied additional questions. Also has urge and stress urinary incontinence but urge most  common and educated on urge drill. Pt would benefit from additional PT to further address deficits.     OBJECTIVE IMPAIRMENTS: decreased activity tolerance, decreased coordination, decreased endurance, decreased ROM, decreased strength, increased fascial restrictions, impaired flexibility, improper body mechanics, and postural dysfunction.   ACTIVITY LIMITATIONS: continence  PARTICIPATION LIMITATIONS: interpersonal relationship  PERSONAL FACTORS: Time since onset of injury/illness/exacerbation and 1 comorbidity: medical history  are also affecting patient's functional outcome.   REHAB POTENTIAL: Good  CLINICAL DECISION MAKING: Stable/uncomplicated  EVALUATION COMPLEXITY: Low   GOALS: Goals reviewed with patient? Yes  SHORT TERM GOALS: Target date: 03/29/24   Pt to be I with HEP for carry over and continuing recommendations for improved outcomes.   Baseline: Goal status: INITIAL  2.  Pt to be I  with vaginal dilators with improved comfort and increase in size to at least 6 for improved tolerance to medical exams and no pain with intercourse.  Baseline:  Goal status: INITIAL  3.  Pt to demonstrate full mobility of lumbpelvic complex for decreased strain at pelvic floor and limit tension status post  Baseline:  Goal status: INITIAL  4.  Pt will be independent with the knack, urge suppression technique, and double voiding in order to improve bladder habits and decrease urinary incontinence.   Baseline:  Goal status: INITIAL   LONG TERM GOALS: Target date: 06/01/24  Pt to be I with advanced HEP for carry over and continuing recommendations for improved outcomes.   Baseline:  Goal status: INITIAL  2.  Pt to be I with vaginal dilators with improved comfort and increase in size to at least 7 or equivalent for improved tolerance to medical exams and no pain with intercourse. Baseline:  Goal status: INITIAL  3.  Pt to report I with perineal moisturizers and lubricant for improved  tissue mobility and decreased discomfort for skin integrity status post menopause and radiation. Baseline:  Goal status: INITIAL  4.  Pt to report no more than one instance of urinary incontinence in a week for improved confidence with community outings.  Baseline:  Goal status: INITIAL  5.  Pt to demonstrate improved coordination of pelvic floor and breathing mechanics with 10# squat with appropriate synergistic patterns to decrease pain and leakage at least 75% of the time for improved ability to complete a 30 minute walk without strain at pelvic floor and symptoms.    Baseline:  Goal status: INITIAL   PLAN:  PT FREQUENCY: 1x/week  PT DURATION: 5 sessions  PLANNED INTERVENTIONS: 97110-Therapeutic exercises, 97530- Therapeutic activity, 97112- Neuromuscular re-education, 97535- Self Care, [id number]- Manual therapy, [phone]- Canalith repositioning, [id number]- Aquatic Therapy, [phone]- Electrical stimulation (manual), [id number]- Vasopneumatic device, [phone] (1-2 muscles), [id number] (3+ muscles)- Dry Needling, Patient/Family education, Taping, Joint mobilization, Spinal mobilization, Scar mobilization, Vestibular training, DME instructions, Cryotherapy, Moist heat, and Biofeedback  PLAN FOR NEXT SESSION: internal pelvic floor assessment if pt consents, breathing mechanics, urge drill review, HEP update   Darryle Navy, PT, DPT 10/15/253:48 PM  [facility] [address], [address] [place], [place] [address] Phone # [phone] Fax [phone]

## 2024-03-03 ENCOUNTER — Encounter: Payer: Self-pay | Admitting: Gynecologic Oncology

## 2024-03-13 ENCOUNTER — Other Ambulatory Visit: Payer: Self-pay | Admitting: Adult Health

## 2024-03-13 DIAGNOSIS — Z1231 Encounter for screening mammogram for malignant neoplasm of breast: Secondary | ICD-10-CM

## 2024-03-16 ENCOUNTER — Ambulatory Visit

## 2024-03-20 ENCOUNTER — Encounter: Payer: Self-pay | Admitting: Radiology

## 2024-04-05 DIAGNOSIS — H5203 Hypermetropia, bilateral: Secondary | ICD-10-CM | POA: Diagnosis not present

## 2024-04-06 DIAGNOSIS — L821 Other seborrheic keratosis: Secondary | ICD-10-CM | POA: Diagnosis not present

## 2024-04-06 DIAGNOSIS — L570 Actinic keratosis: Secondary | ICD-10-CM | POA: Diagnosis not present

## 2024-04-06 DIAGNOSIS — L814 Other melanin hyperpigmentation: Secondary | ICD-10-CM | POA: Diagnosis not present

## 2024-04-06 DIAGNOSIS — D1801 Hemangioma of skin and subcutaneous tissue: Secondary | ICD-10-CM | POA: Diagnosis not present

## 2024-04-11 ENCOUNTER — Ambulatory Visit: Attending: Radiology | Admitting: Physical Therapy

## 2024-04-11 DIAGNOSIS — R293 Abnormal posture: Secondary | ICD-10-CM | POA: Insufficient documentation

## 2024-04-11 DIAGNOSIS — M62838 Other muscle spasm: Secondary | ICD-10-CM | POA: Diagnosis not present

## 2024-04-11 DIAGNOSIS — M6281 Muscle weakness (generalized): Secondary | ICD-10-CM | POA: Diagnosis not present

## 2024-04-11 NOTE — Therapy (Signed)
 OUTPATIENT PHYSICAL THERAPY FEMALE PELVIC EVALUATION   Patient Name: Kylie Davis MRN: [id number] DOB:04/09/1951, 73 y.o., female Today's Date: 04/11/2024  END OF SESSION:  PT End of Session - 04/11/24 0806     Visit Number 2    Date for Recertification  06/01/24    Authorization Type [employer]    Authorization Time Period Cohere Approved 6 visits-03/01/24-09/12/2024    Progress Note Due on Visit 10    PT Start Time 0804    PT Stop Time 0844    PT Time Calculation (min) 40 min    Activity Tolerance Patient tolerated treatment well    Behavior During Therapy [facility] for tasks assessed/performed          Past Medical History:  Diagnosis Date   Arthritis    BMI 39.0-39.9,adult    Diabetes (HCC)    on Wegovy    Endometrial cancer (HCC) 09/2021   sx and radiation   Family history of pancreatic cancer    Family history of peritoneal cancer    History of radiation therapy    Endometrium- HDR 12/16/21-01/15/20- Dr. Lynwood Nasuti   Hypertension    PONV (postoperative nausea and vomiting)    Sleep apnea    C PAP   Past Surgical History:  Procedure Laterality Date   CHOLECYSTECTOMY  05/18/2002   COLONOSCOPY  02/24/2013   DB-MAC-moviprep (exc)-normal-10 yr recall   LYMPH NODE BIOPSY N/A 10/22/2021   Procedure: Sentinel LYMPH NODE BIOPSY;  Surgeon: Viktoria Comer SAUNDERS, MD;  Location: [facility];  Service: Gynecology;  Laterality: N/A;   ROBOTIC ASSISTED TOTAL HYSTERECTOMY WITH BILATERAL SALPINGO OOPHERECTOMY Bilateral 10/22/2021   Procedure: XI ROBOTIC ASSISTED TOTAL HYSTERECTOMY WITH BILATERAL SALPINGO OOPHORECTOMY;  Surgeon: Viktoria Comer SAUNDERS, MD;  Location: [facility];  Service: Gynecology;  Laterality: Bilateral;   Patient Active Problem List   Diagnosis Date Noted   Genetic testing 01/02/2022   Family history of peritoneal cancer 12/10/2021   Family history of pancreatic cancer 12/10/2021   Body mass index (BMI) 40.0-44.9, adult (HCC) 10/22/2021   Endometrial cancer (HCC)  10/06/2021   Obesity, Class II, BMI 35-39.9 09/10/2015   Family history of malignant neoplasm of ovary 01/18/2014   Family history of malignant neoplasm of breast 01/18/2014   Family history of malignant neoplasm of gastrointestinal tract 01/18/2014   OSA (obstructive sleep apnea) 10/10/2013   Sleep apnea 09/13/2013   Allergic rhinitis 09/06/2012   Essential hypertension, benign 09/06/2012    PCP:    Merna Huxley, NP    REFERRING PROVIDER: Wyatt Leeroy HERO, PA-C  REFERRING DIAG: C54.1 (ICD-10-CM) - Endometrial cancer (HCC)  THERAPY DIAG:  Muscle weakness (generalized)  Abnormal posture  Other muscle spasm  Rationale for Evaluation and Treatment: Rehabilitation  ONSET DATE: 2 years ago  SUBJECTIVE:  SUBJECTIVE STATEMENT: Has gotten vaginal dilators, is happy with these and thinks these are helping with urinary incontinence too. Does wear a pad all the time just in case. Urgency has gotten better. Wants to go over a vibrator or lubricant needs.    FUNCTIONAL LIMITATIONS: none at the time  PERTINENT HISTORY:  Endometrial cancer, CHOLECYSTECTOMY, BILATERAL SALPINGO OOPHERECTOMY,  Other:  Sexual abuse: No  DIAGNOSTIC FINDINGS:  Post-void residual: Voiding Cystourethrogram (VCUG):  Ultrasound: PAIN:  Are you having pain? No   PRECAUTIONS: None  RED FLAGS: None   WEIGHT BEARING RESTRICTIONS: No  FALLS:  Has patient fallen in last 6 months? No  OCCUPATION: retired   ACTIVITY LEVEL : low  PLOF: Independent  PATIENT GOALS: to have more comfort with dilator use and have less urinary incontinence    BOWEL MOVEMENT: Pain with bowel movement: No Type of bowel movement:Type (Bristol Stool Scale) 4-5, Frequency daily to every other, and Strain no Fully empty rectum: Yes:    Leakage: No                                                     Caused by:  Pads: No Fiber supplement/laxative No  URINATION: Pain with urination: No Fully empty bladder: Yes:                                  Post-void dribble: No Stream: Strong Urgency: sometimes Frequency:during the day every 1.5-2 hours sometimes a little longer if lower fluid intake                                                         Nocturia: No   Leakage: Urge to void, Walking to the bathroom, Coughing, Sneezing, and Laughing Pads/briefs: No  INTERCOURSE:  Ability to have vaginal penetration Yes  Pain with intercourse: none but lower frequency  Dryness: No Climax: yes Marinoff Scale: 0/3 Lubricant:water  based  PREGNANCY: needs to be asked Vaginal deliveries  Tearing  Episiotomy  C-section deliveries  Currently pregnant   PROLAPSE: None   OBJECTIVE:  Note: Objective measures were completed at Evaluation unless otherwise noted.  DIAGNOSTIC FINDINGS:    PATIENT SURVEYS:    PFIQ-7: 29 bladder  COGNITION: Overall cognitive status: Within functional limits for tasks assessed     SENSATION: Light touch: Appears intact   FUNCTIONAL TESTS:   Single leg stance: mild hip drop bil  Rt:  Lt: Sit-up test:1/3 Squat: Bed mobility:  GAIT: WFL  POSTURE: rounded shoulders, forward head, and posterior pelvic tilt   LUMBARAROM/PROM:  A/PROM A/PROM  Eval (% available)  Flexion 100  Extension 100  Right lateral flexion 75  Left lateral flexion 75  Right rotation 75  Left rotation 75   (Blank rows = not tested)  LOWER EXTREMITY ROM:  Bil hamstrings and adductors limited by 25%  LOWER EXTREMITY MMT:  Hip hips grossly 4/5 PALPATION:  General: tightness in lumbar paraspinals  Pelvic Alignment: WFL  Abdominal: mild tension in lower quadrants   Breathing: chest  External Perineal Exam: deferred                             Internal Pelvic Floor: deferred    Patient confirms identification and approves PT to assess internal pelvic floor and treatment No  PELVIC MMT:   MMT eval  Vaginal   Internal Anal Sphincter   External Anal Sphincter   Puborectalis   Diastasis Recti   (Blank rows = not tested)        TONE: Deferred   PROLAPSE: Deferred   TODAY'S TREATMENT:                                                                                                                              DATE:   03/01/24 EVAL Examination completed, findings reviewed, pt educated on POC, HEP, and extra time for education on vaginal dilators as this was pt's main reason for coming to evaluation, possible benefits of vibrating wand for tissue mobility, pelvic floor PT role in care and purpose, urge drill. Pt motivated to participate in PT and agreeable to attempt recommendations.    04/11/24: Educated on knack and urge drill Given and reviewed HEP Pt had questions about vibrating wand and lubricant options - PT educated that vibration may be helpful for improving blood flow and tissue mobility at pelvic floor to help improve bladder mobility and decreased frequency. All questions answered   PATIENT EDUCATION:  Education details: urge drill, knack, [id number] Person educated: Patient Education method: Explanation, Demonstration, Tactile cues, Verbal cues, and Handouts Education comprehension: verbalized understanding, returned demonstration, verbal cues required, tactile cues required, and needs further education  HOME EXERCISE PROGRAM: [id number]  ASSESSMENT:  CLINICAL IMPRESSION: Patient is a 73 y.o. female  who was seen today for physical therapy treatment for status post endometrial cancer, urinary incontinence and does use vaginal dilators but just reports she does not like it at all and has a poor experience with them and wants more information on other options. Pt had several questions at start of session all answered about urge drill, bladder  irritants, given handouts and HEP, and all questions answered about vibrating wand use. Pt would benefit from additional PT to further address deficits.     OBJECTIVE IMPAIRMENTS: decreased activity tolerance, decreased coordination, decreased endurance, decreased ROM, decreased strength, increased fascial restrictions, impaired flexibility, improper body mechanics, and postural dysfunction.   ACTIVITY LIMITATIONS: continence  PARTICIPATION LIMITATIONS: interpersonal relationship  PERSONAL FACTORS: Time since onset of injury/illness/exacerbation and 1 comorbidity: medical history  are also affecting patient's functional outcome.   REHAB POTENTIAL: Good  CLINICAL DECISION MAKING: Stable/uncomplicated  EVALUATION COMPLEXITY: Low   GOALS: Goals reviewed with patient? Yes  SHORT TERM GOALS: Target date: 03/29/24   Pt to be I with HEP for carry over and continuing recommendations for improved outcomes.   Baseline: Goal status: INITIAL  2.  Pt to be I with vaginal dilators  with improved comfort and increase in size to at least 6 for improved tolerance to medical exams and no pain with intercourse.  Baseline:  Goal status: INITIAL  3.  Pt to demonstrate full mobility of lumbpelvic complex for decreased strain at pelvic floor and limit tension status post  Baseline:  Goal status: INITIAL  4.  Pt will be independent with the knack, urge suppression technique, and double voiding in order to improve bladder habits and decrease urinary incontinence.   Baseline:  Goal status: INITIAL   LONG TERM GOALS: Target date: 06/01/24  Pt to be I with advanced HEP for carry over and continuing recommendations for improved outcomes.   Baseline:  Goal status: INITIAL  2.  Pt to be I with vaginal dilators with improved comfort and increase in size to at least 7 or equivalent for improved tolerance to medical exams and no pain with intercourse. Baseline:  Goal status: INITIAL  3.  Pt to report I  with perineal moisturizers and lubricant for improved tissue mobility and decreased discomfort for skin integrity status post menopause and radiation. Baseline:  Goal status: INITIAL  4.  Pt to report no more than one instance of urinary incontinence in a week for improved confidence with community outings.  Baseline:  Goal status: INITIAL  5.  Pt to demonstrate improved coordination of pelvic floor and breathing mechanics with 10# squat with appropriate synergistic patterns to decrease pain and leakage at least 75% of the time for improved ability to complete a 30 minute walk without strain at pelvic floor and symptoms.    Baseline:  Goal status: INITIAL   PLAN:  PT FREQUENCY: 1x/week  PT DURATION: 5 sessions  PLANNED INTERVENTIONS: 97110-Therapeutic exercises, 97530- Therapeutic activity, 97112- Neuromuscular re-education, 97535- Self Care, 02859- Manual therapy, [phone]- Canalith repositioning, [id number]- Aquatic Therapy, [phone]- Electrical stimulation (manual), [id number]- Vasopneumatic device, [phone] (1-2 muscles), [phone] (3+ muscles)- Dry Needling, Patient/Family education, Taping, Joint mobilization, Spinal mobilization, Scar mobilization, Vestibular training, DME instructions, Cryotherapy, Moist heat, and Biofeedback  PLAN FOR NEXT SESSION: internal pelvic floor assessment if pt consents, breathing mechanics, urge drill review, HEP update   Darryle Navy, PT, DPT 11/25/258:51 AM  [facility] [address] [place], [place] [address] Phone # [phone] Fax [phone]

## 2024-04-11 NOTE — Patient Instructions (Signed)

## 2024-05-04 ENCOUNTER — Telehealth: Payer: Self-pay

## 2024-05-04 NOTE — Telephone Encounter (Unsigned)
 Copied from CRM #[id number]. Topic: Referral - Question >> May 03, 2024  3:32 PM Rozanna MATSU wrote: Reason for CRM: Kimle with Dr Micky office calling about referral [phone]. Please send the prescription RX for referral and sleep test results. She stated missing Dr Neysa signature. [phone] phone number, fax [phone] and email [email or web address]. Stated need whatever sleep study that was completed with our office. Ok to send High priority per CAL

## 2024-05-05 NOTE — Telephone Encounter (Signed)
 Hello [facility] team!  Is this something that you all handle?  A referral prescription to Dr. Micky with Dr. Saundra signature?  Please advise.  Thank you.  It looks like we send the order in on patients last OV with Dr. Neysa on 02/22/2024.

## 2024-05-08 NOTE — Telephone Encounter (Signed)
 Order has been re faxed with sleep study and signature. NFN

## 2024-05-11 ENCOUNTER — Other Ambulatory Visit: Payer: Self-pay | Admitting: Adult Health

## 2024-05-11 DIAGNOSIS — I10 Essential (primary) hypertension: Secondary | ICD-10-CM

## 2024-05-23 ENCOUNTER — Ambulatory Visit
Admission: RE | Admit: 2024-05-23 | Discharge: 2024-05-23 | Disposition: A | Source: Ambulatory Visit | Attending: Adult Health

## 2024-05-23 DIAGNOSIS — Z1231 Encounter for screening mammogram for malignant neoplasm of breast: Secondary | ICD-10-CM

## 2024-05-24 ENCOUNTER — Ambulatory Visit: Payer: Self-pay | Attending: Radiology | Admitting: Physical Therapy

## 2024-05-24 DIAGNOSIS — M62838 Other muscle spasm: Secondary | ICD-10-CM | POA: Diagnosis present

## 2024-05-24 DIAGNOSIS — R293 Abnormal posture: Secondary | ICD-10-CM | POA: Diagnosis present

## 2024-05-24 DIAGNOSIS — M6281 Muscle weakness (generalized): Secondary | ICD-10-CM | POA: Insufficient documentation

## 2024-05-24 NOTE — Therapy (Signed)
 " OUTPATIENT PHYSICAL THERAPY FEMALE PELVIC TREATMENT   Patient Name: Kylie Davis MRN: [id number] DOB:[birth year], 74 y.o., female Today's Date: 05/24/2024  END OF SESSION:  PT End of Session - 05/24/24 1453     Visit Number 3    Date for Recertification  06/01/24    Authorization Type [employer]    Authorization Time Period Cohere Approved 6 visits-03/01/24-09/12/2024    Progress Note Due on Visit 10    PT Start Time 1449    PT Stop Time 1520   denied additional needs at end of session   PT Time Calculation (min) 31 min    Activity Tolerance Patient tolerated treatment well    Behavior During Therapy [facility] for tasks assessed/performed           Past Medical History:  Diagnosis Date   Arthritis    BMI 39.0-39.9,adult    Diabetes (HCC)    on Wegovy    Endometrial cancer (HCC) 09/2021   sx and radiation   Family history of pancreatic cancer    Family history of peritoneal cancer    History of radiation therapy    Endometrium- HDR 12/16/21-01/15/20- Dr. Lynwood Nasuti   Hypertension    PONV (postoperative nausea and vomiting)    Sleep apnea    C PAP   Past Surgical History:  Procedure Laterality Date   CHOLECYSTECTOMY  05/18/2002   COLONOSCOPY  02/24/2013   DB-MAC-moviprep (exc)-normal-10 yr recall   LYMPH NODE BIOPSY N/A 10/22/2021   Procedure: Sentinel LYMPH NODE BIOPSY;  Surgeon: Viktoria Comer SAUNDERS, MD;  Location: [facility];  Service: Gynecology;  Laterality: N/A;   ROBOTIC ASSISTED TOTAL HYSTERECTOMY WITH BILATERAL SALPINGO OOPHERECTOMY Bilateral 10/22/2021   Procedure: XI ROBOTIC ASSISTED TOTAL HYSTERECTOMY WITH BILATERAL SALPINGO OOPHORECTOMY;  Surgeon: Viktoria Comer SAUNDERS, MD;  Location: [facility];  Service: Gynecology;  Laterality: Bilateral;   Patient Active Problem List   Diagnosis Date Noted   Genetic testing 01/02/2022   Family history of peritoneal cancer 12/10/2021   Family history of pancreatic cancer 12/10/2021   Body mass index (BMI) 40.0-44.9, adult (HCC)  10/22/2021   Endometrial cancer (HCC) 10/06/2021   Obesity, Class II, BMI 35-39.9 09/10/2015   Family history of malignant neoplasm of ovary 01/18/2014   Family history of malignant neoplasm of breast 01/18/2014   Family history of malignant neoplasm of gastrointestinal tract 01/18/2014   OSA (obstructive sleep apnea) 10/10/2013   Sleep apnea 09/13/2013   Allergic rhinitis 09/06/2012   Essential hypertension, benign 09/06/2012    PCP:    Merna Huxley, NP    REFERRING PROVIDER: Wyatt Leeroy HERO, PA-C  REFERRING DIAG: C54.1 (ICD-10-CM) - Endometrial cancer (HCC)  THERAPY DIAG:  Muscle weakness (generalized)  Abnormal posture  Other muscle spasm  Rationale for Evaluation and Treatment: Rehabilitation  ONSET DATE: 2 years ago  SUBJECTIVE:  SUBJECTIVE STATEMENT: Reports she is still doing HEP and using of dilators, vibrator. Reports only having urgency with first morning AM void. And a little urinary incontinence with sneezing and coughing but reports being minimally bothered by that.   FUNCTIONAL LIMITATIONS: none at the time  PERTINENT HISTORY:  Endometrial cancer, CHOLECYSTECTOMY, BILATERAL SALPINGO OOPHERECTOMY,  Other:  Sexual abuse: No  DIAGNOSTIC FINDINGS:  Post-void residual: Voiding Cystourethrogram (VCUG):  Ultrasound: PAIN:  Are you having pain? No   PRECAUTIONS: None  RED FLAGS: None   WEIGHT BEARING RESTRICTIONS: No  FALLS:  Has patient fallen in last 6 months? No  OCCUPATION: retired   ACTIVITY LEVEL : low  PLOF: Independent  PATIENT GOALS: to have more comfort with dilator use and have less urinary incontinence    BOWEL MOVEMENT: Pain with bowel movement: No Type of bowel movement:Type (Bristol Stool Scale) 4-5, Frequency daily to every other, and  Strain no Fully empty rectum: Yes:   Leakage: No                                                     Caused by:  Pads: No Fiber supplement/laxative No  URINATION: Pain with urination: No Fully empty bladder: Yes:                                  Post-void dribble: No Stream: Strong Urgency: sometimes Frequency:during the day every 1.5-2 hours sometimes a little longer if lower fluid intake                                                         Nocturia: No   Leakage: Urge to void, Walking to the bathroom, Coughing, Sneezing, and Laughing Pads/briefs: No  INTERCOURSE:  Ability to have vaginal penetration Yes  Pain with intercourse: none but lower frequency  Dryness: No Climax: yes Marinoff Scale: 0/3 Lubricant:water  based  PREGNANCY: needs to be asked Vaginal deliveries  Tearing  Episiotomy  C-section deliveries  Currently pregnant   PROLAPSE: None   OBJECTIVE:  Note: Objective measures were completed at Evaluation unless otherwise noted.  DIAGNOSTIC FINDINGS:    PATIENT SURVEYS:    PFIQ-7: 29 bladder  COGNITION: Overall cognitive status: Within functional limits for tasks assessed     SENSATION: Light touch: Appears intact   FUNCTIONAL TESTS:   Single leg stance: mild hip drop bil  Rt:  Lt: Sit-up test:1/3 Squat: Bed mobility:  GAIT: WFL  POSTURE: rounded shoulders, forward head, and posterior pelvic tilt   LUMBARAROM/PROM:  A/PROM A/PROM  Eval (% available)  Flexion 100  Extension 100  Right lateral flexion 75  Left lateral flexion 75  Right rotation 75  Left rotation 75   (Blank rows = not tested)  LOWER EXTREMITY ROM:  Bil hamstrings and adductors limited by 25%  LOWER EXTREMITY MMT:  Hip hips grossly 4/5 PALPATION:  General: tightness in lumbar paraspinals  Pelvic Alignment: WFL  Abdominal: mild tension in lower quadrants   Breathing: chest                 External Perineal Exam:  deferred                              Internal Pelvic Floor: deferred   Patient confirms identification and approves PT to assess internal pelvic floor and treatment No  PELVIC MMT:   MMT eval  Vaginal   Internal Anal Sphincter   External Anal Sphincter   Puborectalis   Diastasis Recti   (Blank rows = not tested)        TONE: Deferred   PROLAPSE: Deferred   TODAY'S TREATMENT:                                                                                                                              DATE:   03/01/24 EVAL Examination completed, findings reviewed, pt educated on POC, HEP, and extra time for education on vaginal dilators as this was pt's main reason for coming to evaluation, possible benefits of vibrating wand for tissue mobility, pelvic floor PT role in care and purpose, urge drill. Pt motivated to participate in PT and agreeable to attempt recommendations.    04/11/24: Educated on knack and urge drill Given and reviewed HEP Pt had questions about vibrating wand and lubricant options - PT educated that vibration may be helpful for improving blood flow and tissue mobility at pelvic floor to help improve bladder mobility and decreased frequency. All questions answered   05/24/24 Pt educated on progression with dilators - now on size 6 without pain Possible internal assessment but pt reports she would like to wait on this Educated on variation of urge drill to improve urgency in the morning and knack with stressors to decreased leakage.   PATIENT EDUCATION:  Education details: urge drill, knack, [id number] Person educated: Patient Education method: Explanation, Demonstration, Tactile cues, Verbal cues, and Handouts Education comprehension: verbalized understanding, returned demonstration, verbal cues required, tactile cues required, and needs further education  HOME EXERCISE PROGRAM: [id number]  ASSESSMENT:  CLINICAL IMPRESSION: Patient is a 74 y.o. female  who was seen today for physical therapy  treatment. Pt deferred internal assessment, reports she is doing HEP and knack and urge drill as needed and denies question about this. Pt is going to [place]  for 3 months and will return in April and requests final appointment at this time to see how she has been doing with progressions. Pt would benefit from additional PT to further address deficits.     OBJECTIVE IMPAIRMENTS: decreased activity tolerance, decreased coordination, decreased endurance, decreased ROM, decreased strength, increased fascial restrictions, impaired flexibility, improper body mechanics, and postural dysfunction.   ACTIVITY LIMITATIONS: continence  PARTICIPATION LIMITATIONS: interpersonal relationship  PERSONAL FACTORS: Time since onset of injury/illness/exacerbation and 1 comorbidity: medical history  are also affecting patient's functional outcome.   REHAB POTENTIAL: Good  CLINICAL DECISION MAKING: Stable/uncomplicated  EVALUATION COMPLEXITY: Low   GOALS: Goals reviewed with patient? Yes  SHORT TERM GOALS: Target date: 03/29/24  Pt to be I with HEP for carry over and continuing recommendations for improved outcomes.   Baseline: Goal status: INITIAL  2.  Pt to be I with vaginal dilators with improved comfort and increase in size to at least 6 for improved tolerance to medical exams and no pain with intercourse.  Baseline:  Goal status: INITIAL  3.  Pt to demonstrate full mobility of lumbpelvic complex for decreased strain at pelvic floor and limit tension status post  Baseline:  Goal status: INITIAL  4.  Pt will be independent with the knack, urge suppression technique, and double voiding in order to improve bladder habits and decrease urinary incontinence.   Baseline:  Goal status: INITIAL   LONG TERM GOALS: Target date: 06/01/24  Pt to be I with advanced HEP for carry over and continuing recommendations for improved outcomes.   Baseline:  Goal status: INITIAL  2.  Pt to be I with vaginal  dilators with improved comfort and increase in size to at least 7 or equivalent for improved tolerance to medical exams and no pain with intercourse. Baseline:  Goal status: INITIAL  3.  Pt to report I with perineal moisturizers and lubricant for improved tissue mobility and decreased discomfort for skin integrity status post menopause and radiation. Baseline:  Goal status: INITIAL  4.  Pt to report no more than one instance of urinary incontinence in a week for improved confidence with community outings.  Baseline:  Goal status: INITIAL  5.  Pt to demonstrate improved coordination of pelvic floor and breathing mechanics with 10# squat with appropriate synergistic patterns to decrease pain and leakage at least 75% of the time for improved ability to complete a 30 minute walk without strain at pelvic floor and symptoms.    Baseline:  Goal status: INITIAL   PLAN:  PT FREQUENCY: 1x/week  PT DURATION: 5 sessions  PLANNED INTERVENTIONS: 97110-Therapeutic exercises, 97530- Therapeutic activity, 97112- Neuromuscular re-education, 97535- Self Care, 02859- Manual therapy, [id number]- Canalith repositioning, [id number]- Aquatic Therapy, [phone]- Electrical stimulation (manual), [id number]- Vasopneumatic device, [phone] (1-2 muscles), 20561 (3+ muscles)- Dry Needling, Patient/Family education, Taping, Joint mobilization, Spinal mobilization, Scar mobilization, Vestibular training, DME instructions, Cryotherapy, Moist heat, and Biofeedback  PLAN FOR NEXT SESSION: internal pelvic floor assessment if pt consents, breathing mechanics, urge drill review, HEP update   Darryle Navy, PT, DPT 1/7/20264:57 PM  [facility] [address] [place], [place] [address] Phone # [phone] Fax [phone]  "

## 2024-06-22 ENCOUNTER — Telehealth: Payer: Self-pay | Admitting: *Deleted

## 2024-06-22 NOTE — Telephone Encounter (Signed)
 Received fax from Dr. Micky office regarding oral appliance.  Placed in Review folder.  Nothing further needed.

## 2024-08-30 ENCOUNTER — Ambulatory Visit: Attending: Radiology | Admitting: Physical Therapy

## 2024-09-05 ENCOUNTER — Ambulatory Visit: Admitting: Gynecologic Oncology

## 2024-11-07 ENCOUNTER — Ambulatory Visit: Admitting: Gynecologic Oncology

## 2025-01-31 ENCOUNTER — Ambulatory Visit

## 2025-03-08 ENCOUNTER — Ambulatory Visit: Payer: Self-pay | Admitting: Radiation Oncology
